# Patient Record
Sex: Female | Born: 1935
Health system: Southern US, Community
[De-identification: ages and names within clinical notes are randomized; demographics above are authoritative.]

## PROBLEM LIST (undated history)

## (undated) DIAGNOSIS — R011 Cardiac murmur, unspecified: Secondary | ICD-10-CM

## (undated) DIAGNOSIS — K219 Gastro-esophageal reflux disease without esophagitis: Secondary | ICD-10-CM

## (undated) DIAGNOSIS — R0789 Other chest pain: Secondary | ICD-10-CM

## (undated) DIAGNOSIS — R0609 Other forms of dyspnea: Secondary | ICD-10-CM

## (undated) DIAGNOSIS — R0600 Dyspnea, unspecified: Secondary | ICD-10-CM

## (undated) DIAGNOSIS — J90 Pleural effusion, not elsewhere classified: Secondary | ICD-10-CM

## (undated) DIAGNOSIS — J209 Acute bronchitis, unspecified: Secondary | ICD-10-CM

## (undated) HISTORY — PX: APPENDECTOMY: SHX54

## (undated) HISTORY — PX: OOPHORECTOMY: SHX86

## (undated) HISTORY — DX: Other forms of dyspnea: R06.09

## (undated) HISTORY — DX: Pleural effusion, not elsewhere classified: J90

## (undated) HISTORY — DX: Cardiac murmur, unspecified: R01.1

## (undated) HISTORY — DX: Acute bronchitis, unspecified: J20.9

## (undated) HISTORY — DX: Other chest pain: R07.89

## (undated) HISTORY — DX: Dyspnea, unspecified: R06.00

## (undated) HISTORY — DX: Gastro-esophageal reflux disease without esophagitis: K21.9

---

## 1990-01-06 ENCOUNTER — Encounter: Payer: Self-pay | Admitting: Internal Medicine

## 1997-10-29 ENCOUNTER — Other Ambulatory Visit: Admission: RE | Admit: 1997-10-29 | Discharge: 1997-10-29 | Payer: Self-pay | Admitting: Gynecology

## 1998-01-10 ENCOUNTER — Ambulatory Visit (HOSPITAL_COMMUNITY): Admission: RE | Admit: 1998-01-10 | Discharge: 1998-01-10 | Payer: Self-pay | Admitting: Gastroenterology

## 1998-10-31 ENCOUNTER — Other Ambulatory Visit: Admission: RE | Admit: 1998-10-31 | Discharge: 1998-10-31 | Payer: Self-pay | Admitting: Gynecology

## 1999-09-23 ENCOUNTER — Encounter: Payer: Self-pay | Admitting: Gastroenterology

## 1999-09-23 ENCOUNTER — Ambulatory Visit (HOSPITAL_COMMUNITY): Admission: RE | Admit: 1999-09-23 | Discharge: 1999-09-23 | Payer: Self-pay | Admitting: Gastroenterology

## 1999-10-10 ENCOUNTER — Encounter: Payer: Self-pay | Admitting: Gynecology

## 1999-10-10 ENCOUNTER — Encounter: Admission: RE | Admit: 1999-10-10 | Discharge: 1999-10-10 | Payer: Self-pay | Admitting: Gynecology

## 1999-10-31 ENCOUNTER — Other Ambulatory Visit: Admission: RE | Admit: 1999-10-31 | Discharge: 1999-10-31 | Payer: Self-pay | Admitting: Gynecology

## 2000-10-11 ENCOUNTER — Encounter: Admission: RE | Admit: 2000-10-11 | Discharge: 2000-10-11 | Payer: Self-pay | Admitting: Gynecology

## 2000-10-11 ENCOUNTER — Encounter: Payer: Self-pay | Admitting: Gynecology

## 2001-03-28 ENCOUNTER — Other Ambulatory Visit: Admission: RE | Admit: 2001-03-28 | Discharge: 2001-03-28 | Payer: Self-pay | Admitting: Gynecology

## 2001-10-19 ENCOUNTER — Encounter: Admission: RE | Admit: 2001-10-19 | Discharge: 2001-10-19 | Payer: Self-pay | Admitting: Gynecology

## 2002-04-24 ENCOUNTER — Other Ambulatory Visit: Admission: RE | Admit: 2002-04-24 | Discharge: 2002-04-24 | Payer: Self-pay | Admitting: Gynecology

## 2002-10-31 ENCOUNTER — Encounter: Admission: RE | Admit: 2002-10-31 | Discharge: 2002-10-31 | Payer: Self-pay | Admitting: Gynecology

## 2002-10-31 ENCOUNTER — Encounter: Payer: Self-pay | Admitting: Gynecology

## 2003-04-05 ENCOUNTER — Ambulatory Visit (HOSPITAL_COMMUNITY): Admission: RE | Admit: 2003-04-05 | Discharge: 2003-04-05 | Payer: Self-pay | Admitting: Gastroenterology

## 2003-04-12 ENCOUNTER — Ambulatory Visit (HOSPITAL_COMMUNITY): Admission: RE | Admit: 2003-04-12 | Discharge: 2003-04-12 | Payer: Self-pay | Admitting: Gastroenterology

## 2003-10-25 ENCOUNTER — Encounter: Admission: RE | Admit: 2003-10-25 | Discharge: 2003-10-25 | Payer: Self-pay | Admitting: Endocrinology

## 2004-04-22 ENCOUNTER — Other Ambulatory Visit: Admission: RE | Admit: 2004-04-22 | Discharge: 2004-04-22 | Payer: Self-pay | Admitting: Gynecology

## 2004-09-01 ENCOUNTER — Ambulatory Visit: Payer: Self-pay | Admitting: Internal Medicine

## 2004-10-28 ENCOUNTER — Ambulatory Visit (HOSPITAL_COMMUNITY): Admission: RE | Admit: 2004-10-28 | Discharge: 2004-10-28 | Payer: Self-pay | Admitting: Endocrinology

## 2005-04-06 ENCOUNTER — Encounter: Admission: RE | Admit: 2005-04-06 | Discharge: 2005-04-06 | Payer: Self-pay | Admitting: Endocrinology

## 2005-04-20 ENCOUNTER — Ambulatory Visit: Payer: Self-pay | Admitting: Internal Medicine

## 2005-06-29 ENCOUNTER — Ambulatory Visit: Payer: Self-pay | Admitting: Internal Medicine

## 2005-07-29 ENCOUNTER — Ambulatory Visit: Payer: Self-pay | Admitting: Gastroenterology

## 2005-12-22 ENCOUNTER — Ambulatory Visit (HOSPITAL_COMMUNITY): Admission: RE | Admit: 2005-12-22 | Discharge: 2005-12-22 | Payer: Self-pay | Admitting: Endocrinology

## 2006-01-14 ENCOUNTER — Ambulatory Visit: Payer: Self-pay | Admitting: Internal Medicine

## 2006-04-14 ENCOUNTER — Ambulatory Visit: Payer: Self-pay | Admitting: Gastroenterology

## 2006-04-22 ENCOUNTER — Other Ambulatory Visit: Admission: RE | Admit: 2006-04-22 | Discharge: 2006-04-22 | Payer: Self-pay | Admitting: Gynecology

## 2006-07-07 ENCOUNTER — Ambulatory Visit: Payer: Self-pay | Admitting: Gastroenterology

## 2006-07-07 LAB — CONVERTED CEMR LAB: Fecal Occult Blood: NEGATIVE

## 2006-10-04 ENCOUNTER — Encounter: Admission: RE | Admit: 2006-10-04 | Discharge: 2006-10-04 | Payer: Self-pay | Admitting: Endocrinology

## 2006-11-16 ENCOUNTER — Ambulatory Visit: Payer: Self-pay | Admitting: Cardiology

## 2006-12-30 ENCOUNTER — Ambulatory Visit: Payer: Self-pay

## 2006-12-30 ENCOUNTER — Encounter: Payer: Self-pay | Admitting: Cardiology

## 2007-01-27 ENCOUNTER — Encounter: Admission: RE | Admit: 2007-01-27 | Discharge: 2007-01-27 | Payer: Self-pay | Admitting: Gynecology

## 2007-01-28 ENCOUNTER — Ambulatory Visit (HOSPITAL_BASED_OUTPATIENT_CLINIC_OR_DEPARTMENT_OTHER): Admission: RE | Admit: 2007-01-28 | Discharge: 2007-01-28 | Payer: Self-pay | Admitting: Gynecology

## 2007-01-28 ENCOUNTER — Encounter (INDEPENDENT_AMBULATORY_CARE_PROVIDER_SITE_OTHER): Payer: Self-pay | Admitting: Gynecology

## 2007-03-25 ENCOUNTER — Ambulatory Visit: Payer: Self-pay | Admitting: Internal Medicine

## 2007-03-28 ENCOUNTER — Ambulatory Visit (HOSPITAL_COMMUNITY): Admission: RE | Admit: 2007-03-28 | Discharge: 2007-03-28 | Payer: Self-pay | Admitting: Endocrinology

## 2007-03-29 ENCOUNTER — Ambulatory Visit: Payer: Self-pay | Admitting: Internal Medicine

## 2007-04-20 ENCOUNTER — Ambulatory Visit: Payer: Self-pay | Admitting: Internal Medicine

## 2007-04-20 DIAGNOSIS — J3089 Other allergic rhinitis: Secondary | ICD-10-CM

## 2007-04-20 DIAGNOSIS — J302 Other seasonal allergic rhinitis: Secondary | ICD-10-CM

## 2007-04-20 DIAGNOSIS — R0609 Other forms of dyspnea: Secondary | ICD-10-CM

## 2007-04-20 DIAGNOSIS — R0989 Other specified symptoms and signs involving the circulatory and respiratory systems: Secondary | ICD-10-CM

## 2007-04-22 ENCOUNTER — Ambulatory Visit (HOSPITAL_COMMUNITY): Admission: RE | Admit: 2007-04-22 | Discharge: 2007-04-22 | Payer: Self-pay | Admitting: Endocrinology

## 2007-05-17 ENCOUNTER — Ambulatory Visit: Payer: Self-pay | Admitting: Internal Medicine

## 2007-09-02 DIAGNOSIS — J90 Pleural effusion, not elsewhere classified: Secondary | ICD-10-CM

## 2007-09-02 HISTORY — PX: OTHER SURGICAL HISTORY: SHX169

## 2007-09-02 HISTORY — DX: Pleural effusion, not elsewhere classified: J90

## 2007-09-12 ENCOUNTER — Ambulatory Visit: Payer: Self-pay | Admitting: Internal Medicine

## 2007-09-12 DIAGNOSIS — J209 Acute bronchitis, unspecified: Secondary | ICD-10-CM

## 2007-09-13 ENCOUNTER — Ambulatory Visit: Payer: Self-pay | Admitting: Pulmonary Disease

## 2007-09-13 DIAGNOSIS — J90 Pleural effusion, not elsewhere classified: Secondary | ICD-10-CM | POA: Insufficient documentation

## 2007-09-13 DIAGNOSIS — J189 Pneumonia, unspecified organism: Secondary | ICD-10-CM | POA: Insufficient documentation

## 2007-09-14 LAB — CONVERTED CEMR LAB
INR: 1.2 — ABNORMAL HIGH (ref 0.8–1.0)
Prothrombin Time: 13.8 s — ABNORMAL HIGH (ref 10.9–13.3)
aPTT: 31.1 s — ABNORMAL HIGH (ref 21.7–29.8)

## 2007-09-16 ENCOUNTER — Encounter (INDEPENDENT_AMBULATORY_CARE_PROVIDER_SITE_OTHER): Payer: Self-pay | Admitting: Interventional Radiology

## 2007-09-16 ENCOUNTER — Ambulatory Visit (HOSPITAL_COMMUNITY): Admission: RE | Admit: 2007-09-16 | Discharge: 2007-09-16 | Payer: Self-pay | Admitting: Internal Medicine

## 2007-09-16 IMAGING — US US PARACENTESIS
1 series · 9 of 9 positions shown · non-contrast
Comparison: none

CLINICAL DATA: Pneumonia.  Bilateral pleural effusions - left
greater than right.  Request to perform diagnostic and therapeutic
left thoracentesis.

LEFT ULTRASOUND-GUIDED THORACENTESIS
Physician Assistant: SUYAPA
TECHNIQUE: An ultrasound-guided thoracentesis was thoroughly
discussed with the patient and questions answered.  The benefits,
risks, alternatives and complications were also discussed.  The
patient understands and wishes to proceed with the procedure.  A
verbal as well as written consent was obtained. Ultrasound was
performed to localize and mark an adequate pocket of fluid for
thoracentesis.  The left posterior chest wall was prepped and
draped in the normal sterile fashion.  1% Lidocaine was used for
local anesthesia.  Under ultrasound guidance a 19-gauge Yueh
catheter was introduced yielding approximately 580 cc's of pleural
fluid.  The patient tolerated the procedure well and there were no
immediate complications. Post procedure chest x-ray is pending.

[Series 1: unknown · 0.33mm/px · 9 of 9 slices shown]
[im 1/9]
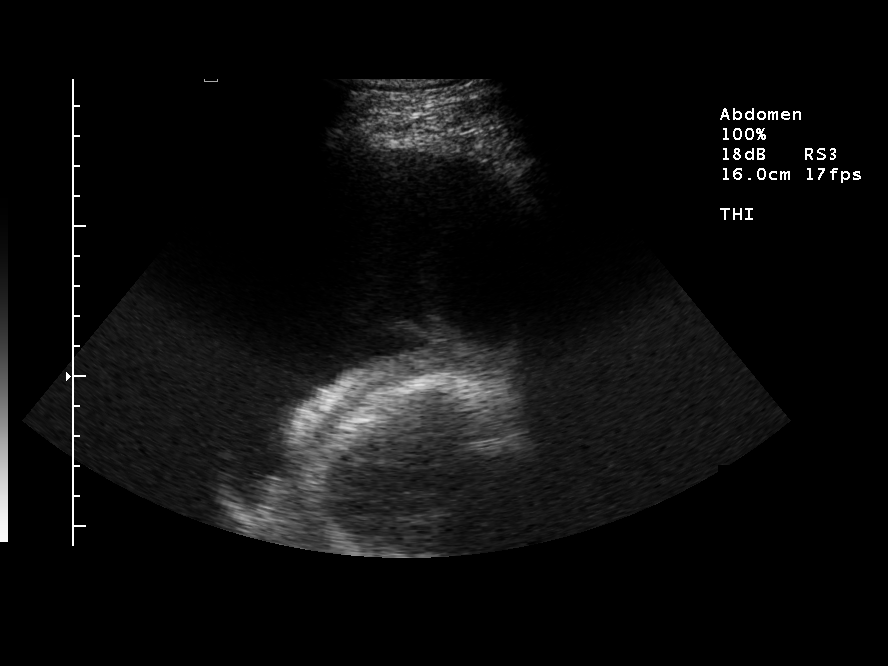
[im 2/9]
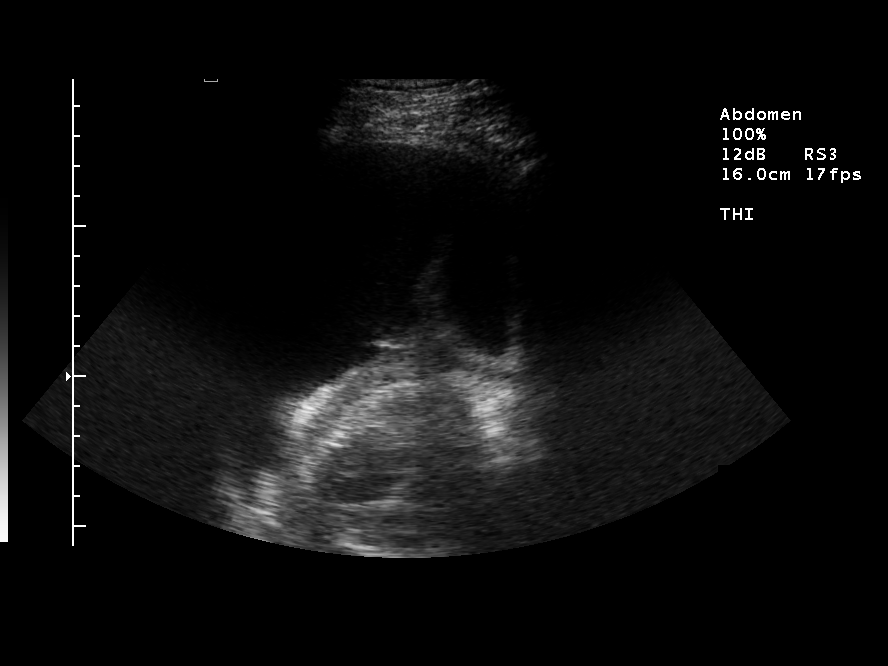
[im 3/9]
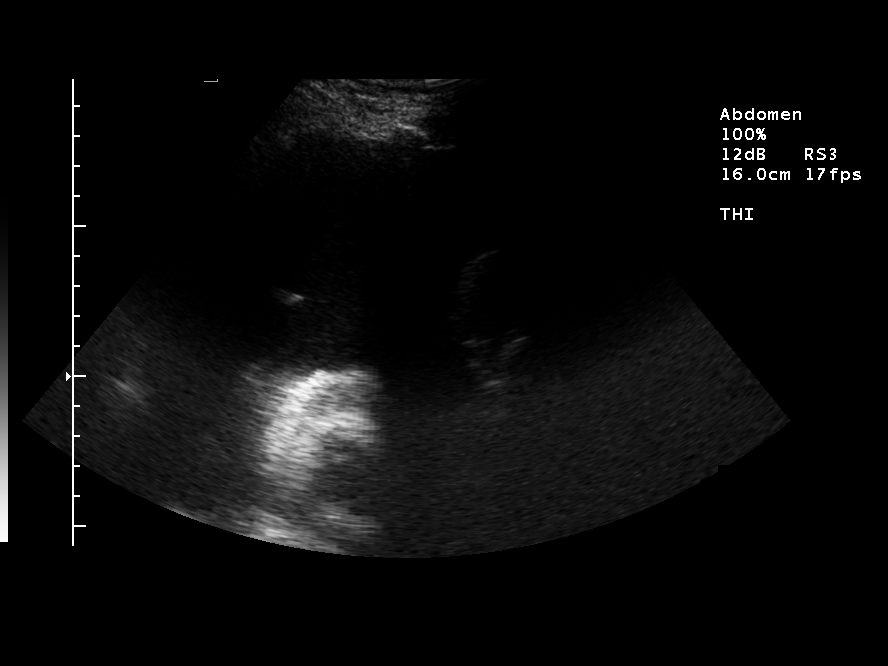
[im 4/9]
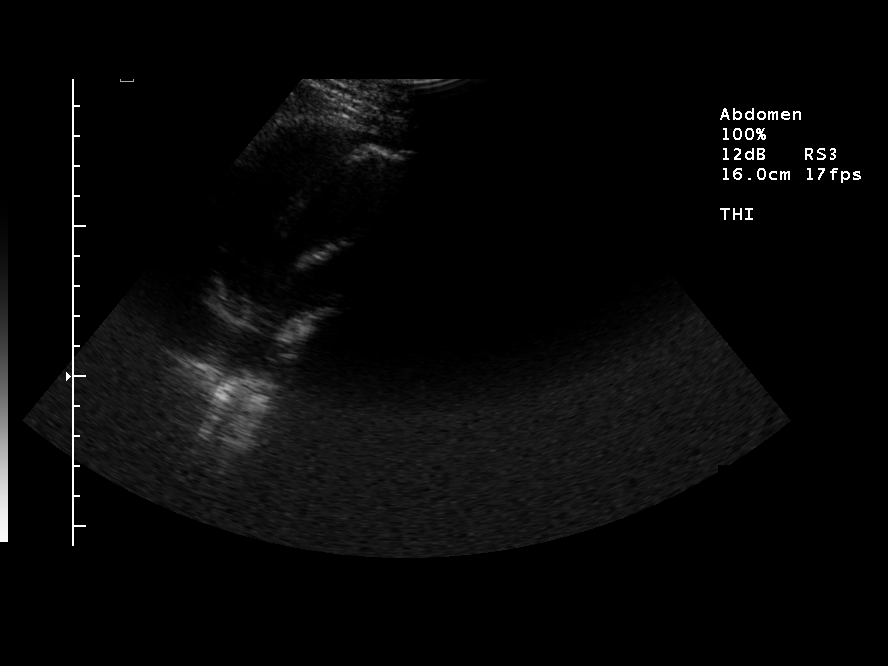
[im 5/9]
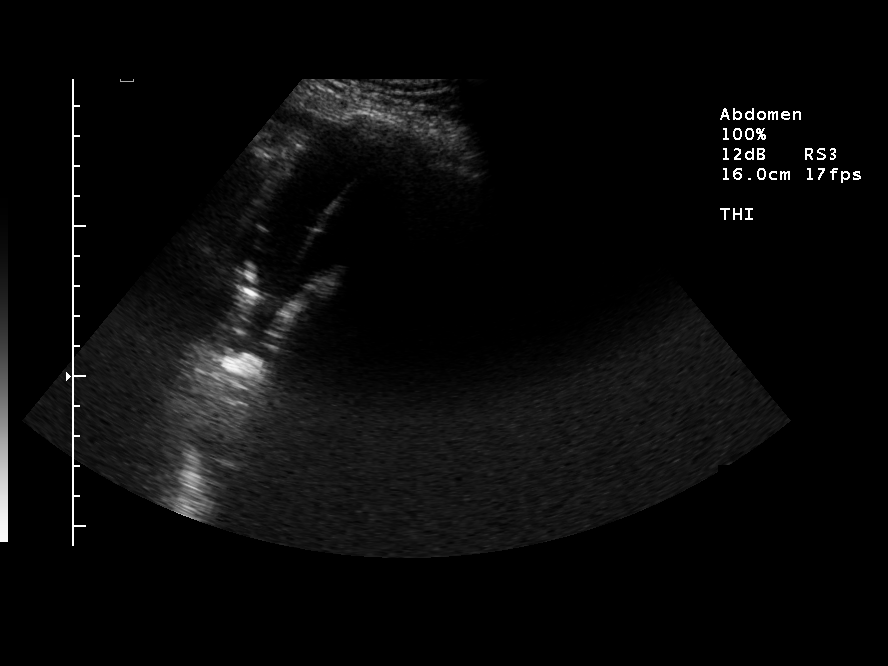
[im 6/9]
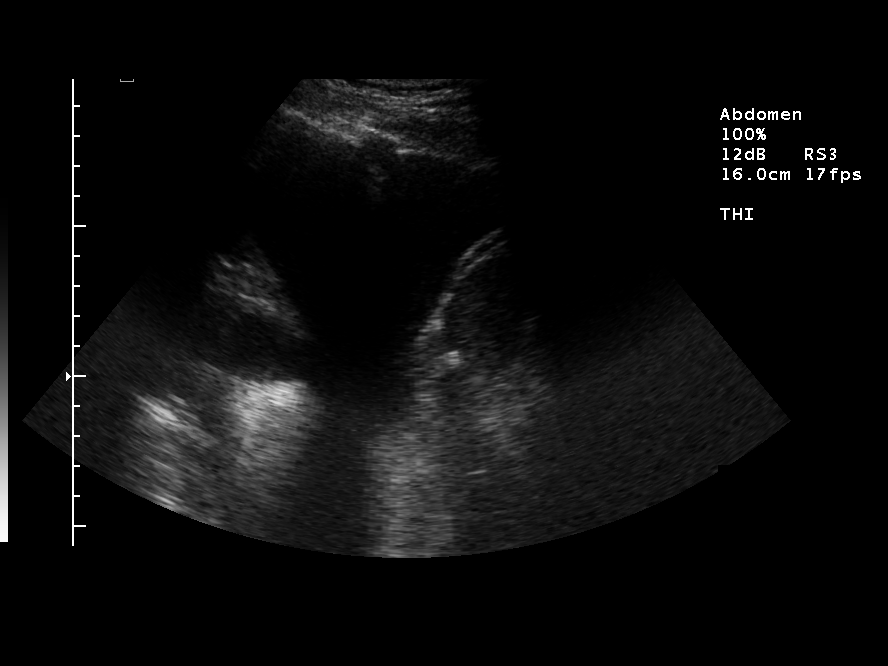
[im 7/9]
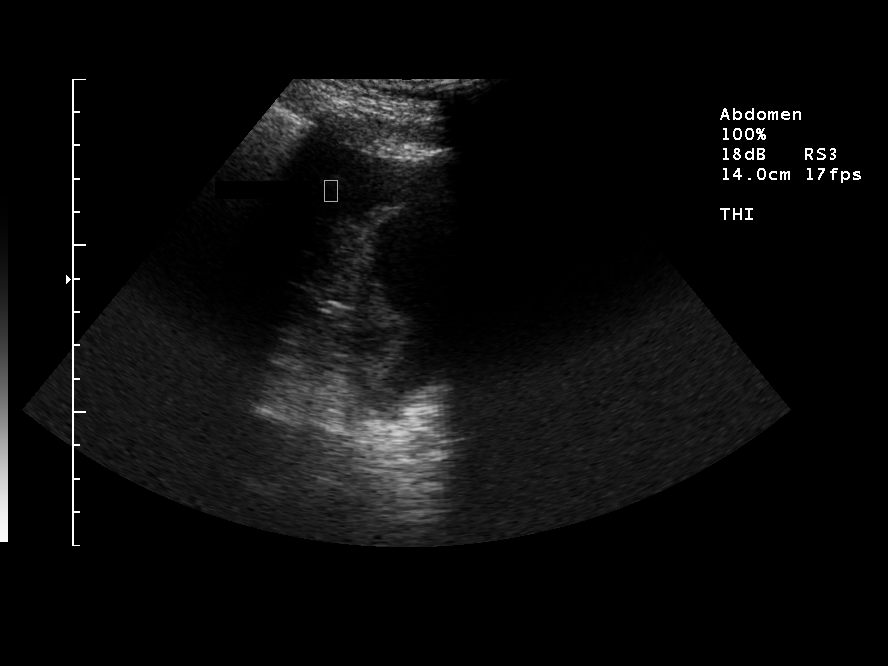
[im 8/9]
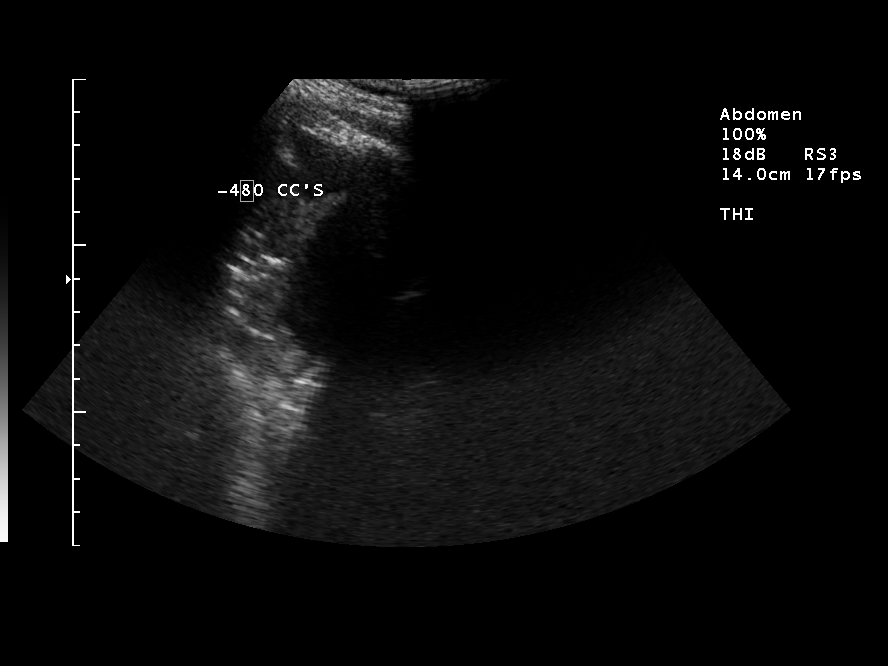
[im 9/9]
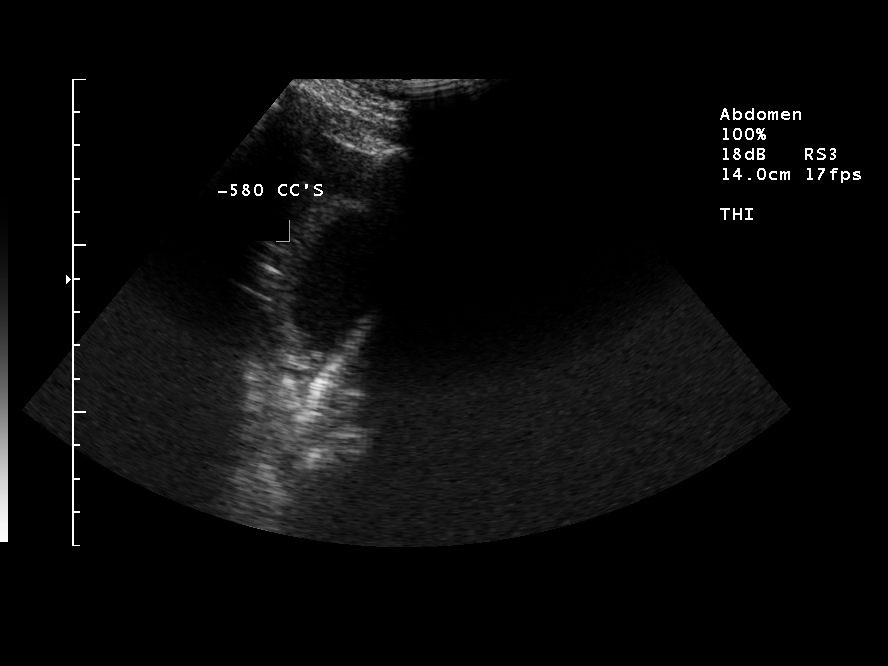

[9 of 9 positions shown; findings below may reference images not displayed]

IMPRESSION: 1. ) Successful ultrasound-guidedleft thoracentesis yielding 580 ml
of fluid. An appropriate of fluid was sent to the laboratory for
further analysis.

2.) Small right pleural effusion noted.

Read by: SUYAPA.-SUYAPA

## 2007-09-20 ENCOUNTER — Ambulatory Visit: Payer: Self-pay | Admitting: Internal Medicine

## 2007-10-25 ENCOUNTER — Ambulatory Visit: Payer: Self-pay | Admitting: Internal Medicine

## 2007-11-24 ENCOUNTER — Encounter: Payer: Self-pay | Admitting: Internal Medicine

## 2008-04-02 ENCOUNTER — Ambulatory Visit: Payer: Self-pay | Admitting: Internal Medicine

## 2008-04-06 ENCOUNTER — Encounter: Admission: RE | Admit: 2008-04-06 | Discharge: 2008-04-06 | Payer: Self-pay | Admitting: Gynecology

## 2008-04-19 ENCOUNTER — Encounter: Payer: Self-pay | Admitting: Internal Medicine

## 2008-04-23 ENCOUNTER — Ambulatory Visit: Payer: Self-pay | Admitting: Internal Medicine

## 2008-05-25 ENCOUNTER — Ambulatory Visit: Payer: Self-pay | Admitting: Cardiology

## 2008-06-07 ENCOUNTER — Ambulatory Visit: Payer: Self-pay

## 2008-06-20 ENCOUNTER — Ambulatory Visit: Payer: Self-pay | Admitting: Internal Medicine

## 2009-01-14 ENCOUNTER — Telehealth (INDEPENDENT_AMBULATORY_CARE_PROVIDER_SITE_OTHER): Payer: Self-pay | Admitting: *Deleted

## 2009-04-08 ENCOUNTER — Ambulatory Visit: Payer: Self-pay | Admitting: Internal Medicine

## 2009-04-21 ENCOUNTER — Telehealth: Payer: Self-pay | Admitting: Internal Medicine

## 2009-05-01 ENCOUNTER — Ambulatory Visit (HOSPITAL_COMMUNITY): Admission: RE | Admit: 2009-05-01 | Discharge: 2009-05-01 | Payer: Self-pay | Admitting: Gynecology

## 2009-08-27 ENCOUNTER — Ambulatory Visit: Payer: Self-pay | Admitting: Internal Medicine

## 2009-11-29 ENCOUNTER — Telehealth: Payer: Self-pay | Admitting: Cardiology

## 2010-03-20 ENCOUNTER — Ambulatory Visit: Payer: Self-pay | Admitting: Internal Medicine

## 2010-03-23 ENCOUNTER — Encounter: Payer: Self-pay | Admitting: Internal Medicine

## 2010-05-02 ENCOUNTER — Telehealth: Payer: Self-pay | Admitting: Internal Medicine

## 2010-05-08 ENCOUNTER — Ambulatory Visit: Payer: Self-pay | Admitting: Internal Medicine

## 2010-05-25 ENCOUNTER — Encounter: Payer: Self-pay | Admitting: Endocrinology

## 2010-05-25 ENCOUNTER — Encounter: Payer: Self-pay | Admitting: Gynecology

## 2010-06-03 NOTE — Miscellaneous (Signed)
Summary: Injection Orders / [facility]    Injection Orders / [facility]    Imported By: Lennie Odor 10/01/2009 17:19:55  _____________________________________________________________________  External Attachment:    Type:   Image     Comment:   External Document

## 2010-06-03 NOTE — Miscellaneous (Signed)
Summary: Field seismologist   Imported By: Sherian Rein 03/26/2010 08:22:40  _____________________________________________________________________  External Attachment:    Type:   Image     Comment:   External Document

## 2010-06-03 NOTE — Assessment & Plan Note (Signed)
Summary: 12 months/apc   Primary Provider/Referring Provider:  Juleen China  CC:  Yearly follow up visit-allergies..  History of Present Illness: 04/02/08- Allergic rhinitis, bronchitis, hx pleural effusion She dropped off allergy vaccine in early summer and now thinks she should be back on it. Felt better while on allergy vaccine, without adverse effects. C/O sinuses ache but denies purulent or blody discharge, headache or fever, wheeze or significant cough.   April 08, 2009- Allergic rhinitis, bronchitis, hx pleural effusion Building own allergy vaccine has gone well with no concerns. She is at 1:50 with no reactions, . We discussed advancing to 1:10. Noted she didn't have trouble this year with Fall leaves. She is concerned about a brown, slightly pruritic spot on back she says Dermatologist has already looked at and given cream for.    March 20, 2010-Allergic rhinitis, bronchitis, hx L pleural effusion- benign Nurse-CC: Yearly follow up visit-allergies. Allergy vaccine has done well with no adverse reactions, giving own at 1:50.  Now nose itches. Breathing seems labored but no wheeze, cough or pain. Did have some discomfort left post ribcage and could feel heartbeat there 2-3 months ago, when lying on that side. Had benign left effusion in 2009, tapped off.  had flu vax. Gets stuffy - discussed decongestants.     Preventive Screening-Counseling & Management  Alcohol-Tobacco     Smoking Status: never  Current Medications (verified): 1)  Multivitamins   Tabs (Multiple Vitamin) .... Take 1 Tablet By Mouth Once A Day 2)  Citracal Plus   Tabs (Multiple Minerals-Vitamins) .... Take 2 Tablet By Mouth Once A Day 3)  Prilosec Otc 20 Mg  Tbec (Omeprazole Magnesium) .... Take 1 By Mouth Once Daily 4)  Zocor 40 Mg  Tabs (Simvastatin) .... Take 1 Tablet By Mouth Once A Day 5)  Adult Aspirin Ec Low Strength 81 Mg  Tbec (Aspirin) .... Take 1 Tablet By Mouth Once A Day 6)  Q-10 Co-Enzyme 30 Mg   Caps (Coenzyme Q10) .... Take 1 Tablet By Mouth Once A Day 7)  Glucosamine 1500 Complex   Caps (Glucosamine-Chondroit-Vit C-Mn) .... Take 1 Tablet By Mouth Once A Day 8)  Allergy Vaccine New Start .Marland Kitchen.. 1:50,000 Teach  (W-E) 9)  Epipen 0.3 Mg/0.84ml (1:1000) Devi (Epinephrine Hcl (Anaphylaxis)) .... For Severe Allergic Reaction 10)  Vitamin B-12 1000 Mcg Tabs (Cyanocobalamin) .... Take 1 By Mouth Once Daily 11)  Fish Oil 1000 Mg Caps (Omega-3 Fatty Acids) .... Take 1 By Mouth Once Daily  Allergies (verified): 1)  ! 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  The patient denies shortness of breath at rest, productive cough, non-productive cough, coughing up blood, chest pain, irregular heartbeats, acid heartburn, indigestion,  loss of appetite, weight change, abdominal pain, difficulty swallowing, sore throat, tooth/dental problems, headaches, nasal congestion/difficulty breathing through nose, and sneezing.    Vital Signs:  Patient profile:   77 year old female Height:      62.5 inches Weight:      152.13  pounds BMI:     27.48 O2 Sat:      95 % on Room air Pulse rate:   67 / minute BP sitting:   110 / 62  (left arm) Cuff size:   regular  Vitals Entered By: Reynaldo Minium CMA (March 20, 2010 4:28 PM)  O2 Flow:  Room air CC: Yearly follow up visit-allergies.   Physical Exam  Additional Exam:  General: A/Ox3; pleasant and cooperative, NAD, SKIN: clear, no rash NODES: no lymphadenopathy HEENT: [place]/AT, EOM- WNL, Conjuctivae- clear, PERRLA, TM-WNL, Nose- clear, Throat- clear and wnl NECK: Supple w/ fair ROM, JVD- none, normal carotid impulses w/o bruits Thyroid-  CHEST: Clear to P&A. I can't appreciate dullness or rub at left posterior ribcage where she indicates. HEART: RRR, no m/g/r heard ABDOMEN: Soft and nl;  ZOX:WRUE, nl pulses, no edema  NEURO: Grossly intact to observation      Impression & Recommendations:  Problem # 1:  ALLERGIC RHINITIS (ICD-477.9)  We will have her allergy vaccine advanced to 1:10 at next order. discussed available decongestants.  Try sample/ script nasonex.   Her updated medication list for this problem includes:    Nasonex 50 Mcg/act Susp (Mometasone furoate) .Marland Kitchen... 1-2 sprays each nostril once daily at bedtime  Problem # 2:  EFFUSION, PLEURAL (ICD-511.9)  Hx pleural effusion. She has a little anxiety about the way her chest feels at times that might possibly reflect old pleural thickening, but she doesn't want a CXR and exam is benign. She can bring this up  again as needed .   Medications Added to Medication List This Visit: 1)  Citracal Plus Tabs (Multiple minerals-vitamins) .... Take 2 tablet by mouth once a day 2)  Vitamin B-12 1000 Mcg Tabs (Cyanocobalamin) .... Take 1 by mouth once daily 3)  Fish Oil 1000 Mg Caps (Omega-3 fatty acids) .... Take 1 by mouth once daily 4)  Nasonex 50 Mcg/act Susp (Mometasone furoate) .Marland Kitchen.. 1-2 sprays each nostril once daily at bedtime  Other Orders: Est. Patient Level III (45409)  Patient  Instructions: 1)  Please schedule a follow-up appointment in 1 year. 2)  sample/ script Nasonex nasal spray for allergies 3)    1-2 sprays each nostril once daily at bedtime 4)  Consider an otc decongestant for stuffy nose: 5)     Sudafed-  sign for it at pharmacy counter. 6)     Phenylephrine (Sudafed-PE and others) find on shelf 7)  Continue allergy vaccine- we will increase the strength next time you order. Prescriptions: EPIPEN 0.3 MG/0.3ML (1:1000) DEVI (EPINEPHRINE HCL (ANAPHYLAXIS)) For severe allergic reaction  #1 x prn   Entered and Authorized by:   Waymon Budge MD   Signed by:   Waymon Budge MD on 03/20/2010   Method used:   Print then Give to Patient   RxID:   [id number] NASONEX 50 MCG/ACT SUSP (MOMETASONE FUROATE) 1-2 sprays each nostril once daily at bedtime  #1 x prn   Entered and Authorized by:   Waymon Budge MD   Signed by:   Waymon Budge MD on 03/20/2010   Method used:   Print then Give to Patient   RxID:  [id number] EPIPEN 0.3 MG/0.3ML (1:1000) DEVI (EPINEPHRINE HCL (ANAPHYLAXIS)) For severe allergic reaction  #1 x prn   Entered and Authorized by:   Waymon Budge MD   Signed by:   Waymon Budge MD on 03/20/2010   Method used:   Historical   RxID:   [id number]

## 2010-06-03 NOTE — Progress Notes (Signed)
Summary: has bulge near her heart-trouble sleeping  Phone Note Call from Patient   Caller: Patient [phone] or [phone] Reason for Call: Talk to Nurse Summary of Call: pt can't sleep at night due to a bulge near her heart, some discomfort, mainly due to the position she has to lay in, some dizziness-pls advise [phone] in [phone] in the pm Initial call taken by: Glynda Jaeger,  November 29, 2009 11:38 AM  Follow-up for Phone Call        spoke with pt who is concerned that she has a hernia and feels like it is "giving her a fit"  Instructed pt there is no mention of a hernia at her last office visit with Dr Daleen Squibb from 05/2008 and that she will need to contact her primary care MD for any type of treatment or referral for a hernia.  Pt states understanding Follow-up by: Charolotte Capuchin, RN,  November 29, 2009 12:42 PM     Appended Document: has bulge near her heart-trouble sleeping  Reviewed Juanito Doom, MD

## 2010-06-05 NOTE — Progress Notes (Signed)
Summary: rx- needs today  Phone Note Call from Patient Call back at Home Phone [phone]   Caller: Patient Call For: young Summary of Call: pt [facility]pi pen [facility] [address] [facility] pt ph# [phone] or [phone] (needs today for ins coverage) Initial call taken by: Tivis Ringer, CNA,  May 02, 2010 3:04 PM  Follow-up for Phone Call        rx has been sent to the pharmacy for the epi pen with refills.  lmom for pt to make her aware of rx has been sent to the pharamcy Randell Loop CMA  May 02, 2010 3:17 PM     Prescriptions: EPIPEN 0.3 MG/0.3ML (1:1000) DEVI (EPINEPHRINE HCL (ANAPHYLAXIS)) For severe allergic reaction  #1 x 11   Entered by:   Randell Loop CMA   Authorized by:   Waymon Budge MD   Signed by:   Randell Loop CMA on 05/02/2010   Method used:   [facility]ron[facility] to        [facility]  [phone]* (retail)       [address] [place], [place]  [address]       Ph: [phone] or [phone]       Fax: [phone]   RxID:   [id number]

## 2010-09-16 NOTE — Assessment & Plan Note (Signed)
[facility]                            CARDIOLOGY OFFICE NOTE   NAME:Bargar, JAPLEEN Charles                      MRN:          [id number]  DATE:05/25/2008                            DOB:          11-14-1933    Sara Charles comes in today to see me because of difficulty breathing  particularly when she climbs steps.   I saw her as the patient in July 2008.  At that time there was a  question of her having a mildly dilated abdominal aorta, as well as she  was having dyspnea on exertion, some atypical chest pain.   A 2-D echocardiogram was essentially normal.  She subsequently had an  abdominal ultrasound ordered by me that showed no evidence of abdominal  aortic aneurysm.  She had minimal atherosclerosis.   She is now 77 years of age, though she looks much younger.  She is  followed by [facility] for allergies.  She had pneumonia  recently.  She has also had a history of some subpleural effusions that  looked better on her chest x-ray done in May 2009.   She is currently on Zocor 40 mg a day, aspirin 81 mg a day, some  multivitamins, and allergy vaccines.  She takes Aciphex on occasion as  well.   She denies orthopnea, PND, or peripheral edema.  She has had no true  angina.   PHYSICAL EXAMINATION:  VITAL SIGNS:  Her blood pressure today is 121/78,  her pulse is 82 and regular, weight is 138 which is down 7, looks much  younger than stated age.  Skin is slightly pale.  HEENT:  Otherwise is normal.  Carotid upstrokes were equal bilaterally  without bruits.  No JVD.  Thyroid is not enlarged.  Trachea is midline.  There is no lymphadenopathy.  LUNGS:  Clear to auscultation and percussion with no rub or decreased  breath sounds in the bases.  There was definitely no crackles.  HEART:  A nondisplaced PMI.  Normal S1 and S2.  No murmur, rub, or  gallop.  ABDOMEN:  Soft, good bowel sounds.  No midline bruit.  No hepatomegaly.  EXTREMITIES:  No  cyanosis, clubbing, or edema.  Pulses are intact.  There is no sign of DVT.  NEUROLOGIC:  Intact.  MUSCULOSKELETAL:  Some chronic arthritic changes.   Her electrocardiogram demonstrates sinus rhythm with low voltage.  She  has got poor progression across the anterior precordium which is stable.  Again, her echocardiogram showed normal left ventricular function wall  motion.   ASSESSMENT:  Dyspnea on exertion which is increased significantly.  She  does have multiple cardiac risk factors and I feel she wants a  functional study.   PLAN:  Exercise rest stress Myoview.  Hopefully, can reproduce her  symptoms and evaluate her coronaries at the same time.   Assuming this is negative, I will see her back p.r.n.  If she does have  any significant ischemia, she will need cardiac catheterization.  All  questions were addressed and answered.     Sara C. Daleen Squibb, MD, [facility]  Electronically  Signed    TCW/MedQ  DD: 05/25/2008  DT: 05/26/2008  Job #: [id number]   cc:   [facility], M.D.

## 2010-09-16 NOTE — Op Note (Signed)
Sara Charles, Sara [facility]               ACCOUNT NO.:  [id number]   MEDICAL RECORD NO.:  [id number]          PATIENT TYPE:  AMB   LOCATION:  [facility]                         FACILITY:  [facility]   PHYSICIAN:  Gretta Cool, M.D. DATE OF BIRTH:  1934-04-11   DATE OF PROCEDURE:  01/28/2007  DATE OF DISCHARGE:                               OPERATIVE REPORT   PREOPERATIVE DIAGNOSES:  1. Right lower quadrant discomfort with complex right adnexal mass by      CT and by ultrasound. Normal CA125s.  2. Endometrial polyp.   PROCEDURES:  1. Diagnostic laparoscopy.  2. Right right salpingo-oophorectomy with Endo catch removal via the      laparoscope port.  3. Hysteroscopy resection of endometrial polyp.   SURGEON:  Beather Arbour, MD.   ANESTHESIA:  General orotracheal.   INDICATIONS FOR PROCEDURE:  The patient is a 77 year old with lower  abdominal, right lower quadrant pain and CT diagnosis of a complex  adnexal mass in the right lower quadrant. She has had ultrasound  followup of the complex structure with stability over time, but she  continues to have right lower quadrant discomfort. Her CA125s have been  normal. She also has an endometrial polyp by ultrasound. She is now  admitted for diagnostic laparoscopy, right salpingo-oophorectomy,  possible bilateral salpingo-oophorectomy, hysteroscopy and resection of  the endometrial polyp. She understands the risks/benefit ratio, the  options and alternatives all. She also understands the very small  possibility that this is a malignancy and might require further  evaluation or treatment.   DESCRIPTION OF PROCEDURE:  Under excellent general anesthesia, the  patient was prepped and draped in Allen stirrups. A vertical skin  incision was made at the umbilicus. The Veress cannula was then  introduced and approximately 2.5 liters of carbon dioxide introduced.  The laparoscope trocar port was then placed, 10-mm size. The peritoneal  cavity was then  examined. The uterus was noted to be retroverted. The  Hulka tenaculum applied to the cervix was noted to be bulging the cervix  and not in the fundus of the uterus.   The accessory ports were placed and the adnexal structures examined. The  left adnexal structure was a small atrophic ovary. On the right she had  an enlarged ovarian cyst and a parovarian versus ovarian cyst that  transilluminated easily with clear fluid inside. There were no  excrescences or suggestion of ovarian disease. At this point the  Seitzinger tripolar forceps was used to cauterize the infundibulopelvic  and then ovarian ligaments progressively so as to remove the entire  ovary. The Endo catch was then used with 5-mm port direction to recover  the specimen. The Endo catch was then delivered through the 10-mm port  site. The incision had to be enlarged to remove the ovary. Note the Endo  catch bag leaked upon removal through the port site, but all of the  tissue was easily removed. At this point the pelvic cavity was irrigated  with Lactated Ringers to remove debris. There was no bleeding from any  of the sites, no other evidence of intraperitoneal pathology.  At this point the Hulka tenaculum was removed and under direct vision  the cervix recannulated with a set of Pratt dilators. Under direct  vision the cavity was entered, the cavity was examined with a 5-mm scope  and then with the 7-mm resectoscope. The cavity was then systematically  examined. A polyp was noted on the anterior wall and was resected and  submitted for pathologic exam.   At the end of the procedure there was no significant bleeding. The  hysteroscopy portion was concluded. At this point the peritoneal cavity  was again examined and there is no evidence of any abnormality and port  sites were closed with deep suture of 0 Vicryl, UR6 needle and  subcuticular closure of 5-0 Vicryl. Skin closure with Steri-Strips. The  patient's  incisions  were infiltrated with Marcaine 0.5%. she was then  returned to the recovery room in excellent condition without  complication.           ______________________________  Gretta Cool, M.D.     CWL/MEDQ  D:  01/28/2007  T:  01/28/2007  Job:  [id number]   cc:   Brooke Bonito, M.D.  Fax: [phone]

## 2010-09-16 NOTE — Assessment & Plan Note (Signed)
[facility]                             PULMONARY OFFICE NOTE   NAME:[name], [name]                      MRN:          [id number]  DATE:03/25/2007                            DOB:          11-04-1933    PROBLEM:  Allergic rhinitis.   HISTORY:  She had gotten off of allergy vaccine during the summer and  has decided that was a mistake and she wants to restart. She is noticing  increased nasal congestion and some tight feeling through the chest  which she says is what happened the last time she tried to quit several  years ago. She has noticed wheezing or purulent discharge. There had  been no problems with her vaccine. She had a dyspnea evaluation in July  with Dr. Daleen Squibb, who attributed it primarily to deconditioning. Ejection  fraction on echocardiogram was 65%.   MEDICATIONS:  1. Allergy vaccine.  2. Calcium.  3. Aciphex 20 mg.  4. Zocor 40 mg.  5. Coenzyme Q10.  6. Glucosamine.  7. Magnesium.  8. Fish oil.  9. Probiotic.  10.Vitamins.  11.Pepto Bismol p.r.n.   Drug intolerant ERYTHROMYCIN.   OBJECTIVE:  Weight 145 pounds, blood pressure 112/64, pulse 83, room air  saturation 98% at rest. External auditory canals were narrow with  cerumen, non-occlusive, what I could see of the tympanic membranes was  clear. There was bilateral nasal turbinate edema with some mucus  bridging, no polyps. Pharynx was clear. Voice quality normal. No  strider. Heart sounds regular without murmur. Lung fields clear.   SKIN TESTING:  Positive histamine, negative diluent controls. Positive  reaction especially for grass, fall weed, tree pollens, and dust mite.  She reacts to feather. She is aware of that and has no feather products.   We reviewed allergy vaccine risk, goals, and realistic expectations, and  discussed anaphylaxis, policy concerning administration of allergy  vaccine outside of a medical office, and EpiPen use.  An EpiPen was prescribed. She  wishes to get back on allergy vaccine and  is going to restart with new mix building per protocol. Schedule return  in three months, earlier p.r.n.     Clinton D. Maple Hudson, MD, [facility], FACP  Electronically Signed    CDY/MedQ  DD: 03/26/2007  DT: 03/27/2007  Job #: [id number]   cc:   Brooke Bonito, M.D.

## 2010-09-16 NOTE — Assessment & Plan Note (Signed)
[facility]                            CARDIOLOGY OFFICE NOTE   NAME:Sara Charles, Sara Charles                      MRN:          [id number]  DATE:11/16/2006                            DOB:          Nov 16, 1933    REASON FOR CONSULTATION:  I was asked by Dr. Brooke Bonito to follow up  with Sara Charles for dyspnea on exertion and a mildly dilated  aortic root.   HISTORY OF PRESENT ILLNESS:  She was seen in the office by Dr. Madolyn Frieze.  Crenshaw  of our group in December 2004.  At that time she had atypical  chest pain and mild dyspnea on exertion.  A 2-D echocardiogram  demonstrated a mildly dilated abdominal aorta.  She had a negative  stress Myoview and a 2-D echocardiogram demonstrated normal left  ventricular function and trivial aortic insufficiency and mitral  regurgitation.   She has had significant dyspnea on exertion but is about the same as it  was over the past year.  She denies any angina per se.  She has had no  orthopnea, PND, abdominal pain or back pain.   She does have a history of reflux and occasionally has reflux symptoms.   PAST MEDICAL HISTORY:  Significant for intolerance to [facility].   CURRENT MEDICATIONS:  1. Allergy vaccine.  2. Multivitamin.  3. Calcium.  4. Citracal.  5. Aciphex 20 mg q.d.  6. Zocor 40 mg q.d.  7. Aspirin 81 mg q.d.  8. Coenzyme Q10 q.d.  9. Glucosamine.  10.Magnesium.  11.Fish oil.  12.Vitamin B.   CARDIAC RISK FACTORS:  Age and hyperlipidemia.  She said that her blood  sugar is borderline.   SOCIAL HISTORY:  She does not smoke and she rarely drinks any alcohol.   FAMILY HISTORY:  Her mother has coronary artery disease, as did her  father.  Her mother is a patient of mine.   REVIEW OF SYSTEMS:  Denied any headache, fever, chills.  No productive  cough or hemoptysis.  No dysphagia or odynophagia, melena or  hematochezia.  She does have occasional reflux symptoms.  She has had no  dysuria or  hematuria.  No rash.  No seizure activity.  There is no  history of claudication.  The remainder of her systems are negative.   PHYSICAL EXAMINATION:  GENERAL:  She is very pleasant.  VITAL SIGNS:  Her blood pressure is 116/72, pulse 78 and regular.  Her  weight is 145 pounds.  She is 5 feet 2 inches.  HEENT:  Normocephalic and atraumatic.  Pupils equal, round, reactive to  light and accommodation.  Extraocular movements intact.  Sclerae clear.  Facial symmetry is normal.  NECK:  Supple, carotid upstrokes equal bilaterally without bruits.  No  jugular venous distention.  Thyroid is not enlarged.  Trachea is  midline.  LUNGS:  Clear.  HEART:  Her PMI is non-displaced.  There is no aortic insufficiency  murmur.  There is a normal S1 and S2.  ABDOMEN:  Soft with good bowel sounds.  No tenderness.  No midline  bruit.  EXTREMITIES:  No clubbing, cyanosis or edema.  Pulses intact.  NEUROLOGIC:  Intact.  SKIN:  Pale, otherwise unremarkable.   Electrocardiogram shows a sinus rhythm, low voltage, unchanged.  Poor R-  wave progression across the anterior precordium.   IMPRESSION/PLAN:  Ms. Erlandson still has dyspnea on exertion but I do not  gather that it has changed that much.  Some of this is probably  deconditioning.  I do not think she has any significant coronary artery  disease by history.   She needs followup of her left ventricular function as well as her  abdominal aortic dilatation.  I have arranged for her to have a 2-D  echocardiogram, as well as an abdominal ultrasound.  Assuming that these  are stable and not significant, will see her back on a p.r.n. basis.     Thomas C. Daleen Squibb, MD, [facility]  Electronically Signed    TCW/MedQ  DD: 11/16/2006  DT: 11/17/2006  Job #: [id number]   cc:   Brooke Bonito, M.D.

## 2010-09-19 NOTE — Assessment & Plan Note (Signed)
[facility]                         GASTROENTEROLOGY OFFICE NOTE   NAME:Sara Charles, Sara Charles                      MRN:          [id number]  DATE:04/14/2006                            DOB:          1934/04/25    Dietrich comes in, said Dr. Juleen China did a stool test on her that was  positive, states she has not seen any blood herself, has not really been  anemic.  She says she is feeling all right.  Her last colonoscopic  examination was in 2000, but her colon was so elongated and redundant I  really could not get around like the same problem I had once before.  We  did a barium enema, which did not reveal anything significant except for  the very elongated and tortuous colon.  They had indicated that it was  very elongated and redundant, the cecum appeared to be mobile, lies in  the region of the right upper quadrant.  There was incomplete rotation  and more of it was located on the left side than the right, and the  small bowel was on the right, so no wonder it is so difficult to pass.  I talked to her about getting a virtual colonoscopy, told her to see if  maybe she could get her insurance company to do this.  I told her to try  some probiotics and acidophilus, she says this usually helps her with  her bowel activity.  I told her we would check her stools for occult  blood again.  If there was significant positivity we could always do the  colonoscopy or virtual as needed.   PHYSICAL EXAMINATION:  She weighed 151, blood pressure 112/60, pulse 70  and regular.  Neck arteries, extremities all unremarkable.   IMPRESSION:  Positive stool as per Dr. Juleen China in a position who has known  very redundant, tortuous colon, as I described from my experiences doing  colonoscopy on her.  My recommendation again was to do the virtual  colonoscopy some time when she needs a followup anyway, as I really  think that doing an endoscopic procedure is almost impossible.  She is  in agreement with this.  We have talked about this in the past.  Dr.  Juleen China had tried to get her to see Dr. Virginia Rochester, but with the fact she had  been a patient of mine for a number of years she wanted our input, and  in any case I am not sure that a followup colonoscopic examination would  be reasonable under the circumstances.     Ulyess Mort, MD  Electronically Signed    SML/MedQ  DD: 04/14/2006  DT: 04/15/2006  Job #: [id number]   cc:   Brooke Bonito, M.D.

## 2010-09-19 NOTE — Letter (Signed)
April 14, 2006    Brooke Bonito, M.D.  [address], [address]  [place], [place] [address]   RE:  NENE, ARANAS  MRN:  [id number]  /  DOB:  1933-05-22   Dear Maurine Minister:   I saw our mutual patient, Shanterica Harbach.  She indicated that you had  found some positive stool on her physical examination, and suggested she  have a colonoscopy.  You did set her up with Dr. Virginia Rochester, but I have done a  number of procedures on her in the past and seen her over the past  years, so she returned back here.  In reviewing her chart, we have never  really been able to satisfactorily get to her right colon, and several  barium enemas have been done which revealed incomplete rotation with the  cecum up in the right upper quadrant, most of her colon on her left  side, and I am sure it is quite mobile and this is what precludes [place]  from being able to get to the cecum.  This being the case, I really  think doing a virtual colonoscopy would be in order in a patient with  this condition.  I have asked her to do Coloscreens, and if they  continue to be positive I think that we should pursue this.  If not,  then I think that this can be done some time in the future.  I still  think that it would be advisable to pursue her with a virtual  colonoscopy instead of going through the endoscopic procedure, unless a  lesion was seen that just had to be removed endoscopically.    Sincerely,      Ulyess Mort, MD  Electronically Signed    SML/MedQ  DD: 04/14/2006  DT: 04/15/2006  Job #: [id number]

## 2010-11-10 ENCOUNTER — Encounter: Payer: Self-pay | Admitting: Cardiology

## 2010-11-10 ENCOUNTER — Telehealth: Payer: Self-pay | Admitting: Internal Medicine

## 2010-11-10 DIAGNOSIS — R5383 Other fatigue: Secondary | ICD-10-CM

## 2010-11-10 NOTE — Telephone Encounter (Signed)
Spoke with the pt and she c/o having body aches especially on the left side of her chest and fatigue x 2 weeks. She denies cough or fever or chest congestion. She states these are the same feelings she had when she had PNA a few years ago. Pt requesting an appt with Dr. Maple Hudson. Please advise , no open slots. Carron Curie, CMA Allergies  Allergen Reactions  . Erythromycin

## 2010-11-10 NOTE — Telephone Encounter (Signed)
Please ask her to come for outpatient CXR and CBC w/ diff, then script for Avelox 400 mg, # 7, 1 daily

## 2010-11-11 ENCOUNTER — Ambulatory Visit (INDEPENDENT_AMBULATORY_CARE_PROVIDER_SITE_OTHER)
Admission: RE | Admit: 2010-11-11 | Discharge: 2010-11-11 | Disposition: A | Discharge status: Home / Self Care | Payer: Self-pay | Source: Ambulatory Visit | Attending: Internal Medicine | Admitting: Internal Medicine

## 2010-11-11 ENCOUNTER — Other Ambulatory Visit (INDEPENDENT_AMBULATORY_CARE_PROVIDER_SITE_OTHER): Payer: Medicare Other

## 2010-11-11 DIAGNOSIS — R5383 Other fatigue: Secondary | ICD-10-CM

## 2010-11-11 DIAGNOSIS — R079 Chest pain, unspecified: Secondary | ICD-10-CM

## 2010-11-11 LAB — CBC WITH DIFFERENTIAL/PLATELET
Basophils Absolute: 0 10*3/uL (ref 0.0–0.1)
Eosinophils Absolute: 0.1 10*3/uL (ref 0.0–0.7)
HCT: 40.6 % (ref 36.0–46.0)
Hemoglobin: 13.8 g/dL (ref 12.0–15.0)
Lymphs Abs: 1.5 10*3/uL (ref 0.7–4.0)
MCHC: 33.9 g/dL (ref 30.0–36.0)
Monocytes Absolute: 0.5 10*3/uL (ref 0.1–1.0)
Neutro Abs: 4.4 10*3/uL (ref 1.4–7.7)
Platelets: 268 10*3/uL (ref 150.0–400.0)
RDW: 12.3 % (ref 11.5–14.6)

## 2010-11-11 MED ORDER — MOXIFLOXACIN HCL 400 MG PO TABS: 400.0000 mg | ORAL_TABLET | Freq: Every day | ORAL | Status: AC

## 2010-11-11 NOTE — Telephone Encounter (Signed)
Pt aware of recs and will come in today for cxr and labs. I advised to not start meds until after cxr and labs. Carron Curie, CMA

## 2010-11-13 ENCOUNTER — Telehealth: Payer: Self-pay | Admitting: Internal Medicine

## 2010-11-13 NOTE — Progress Notes (Signed)
Quick Note:  LMTCB ______ 

## 2010-11-13 NOTE — Telephone Encounter (Signed)
Pt aware CXR and CBC are normal per Dr. Maple Hudson.

## 2011-01-28 LAB — BODY FLUID CULTURE

## 2011-01-28 LAB — ANAEROBIC CULTURE

## 2011-01-28 LAB — LACTATE DEHYDROGENASE, PLEURAL OR PERITONEAL FLUID: LD, Fluid: 179 — ABNORMAL HIGH

## 2011-01-28 LAB — BODY FLUID CELL COUNT WITH DIFFERENTIAL: Eos, Fluid: 1

## 2011-01-28 LAB — FUNGUS CULTURE W SMEAR

## 2011-02-03 ENCOUNTER — Ambulatory Visit (INDEPENDENT_AMBULATORY_CARE_PROVIDER_SITE_OTHER): Payer: Medicare Other

## 2011-02-03 DIAGNOSIS — J309 Allergic rhinitis, unspecified: Secondary | ICD-10-CM

## 2011-02-04 ENCOUNTER — Telehealth: Payer: Self-pay | Admitting: *Deleted

## 2011-02-04 NOTE — Telephone Encounter (Signed)
Pt. Called wanting to order vac.,her last order was 05-02-10. I asked her how long it had been since her last shot? She told me two months. I made 1:10 giving her build-up instructions(0.1 to 0.5) per Dr.Young's last recommendation(computer said 1:50,card said 1:10) We make up what's written on the card.

## 2011-02-12 LAB — POCT HEMOGLOBIN-HEMACUE
Hemoglobin: 15.4 — ABNORMAL HIGH
Operator id: 268271

## 2011-03-23 ENCOUNTER — Encounter: Payer: Self-pay | Admitting: Internal Medicine

## 2011-03-23 ENCOUNTER — Ambulatory Visit (INDEPENDENT_AMBULATORY_CARE_PROVIDER_SITE_OTHER): Payer: Medicare Other | Admitting: Internal Medicine

## 2011-03-23 VITALS — BP 130/82 | HR 70 | Ht 62.5 in | Wt 157.0 lb

## 2011-03-23 DIAGNOSIS — J309 Allergic rhinitis, unspecified: Secondary | ICD-10-CM

## 2011-03-23 MED ORDER — EPINEPHRINE 0.3 MG/0.3ML IJ DEVI: 0.3000 mg | Freq: Once | INTRAMUSCULAR | Status: AC

## 2011-03-23 NOTE — Progress Notes (Signed)
03/22/11- 77 yoF never smoker followed for allergic rhinitis complicated by history of acute bronchitis, pneumonia, pleural effusion. LOV-03/20/2010 She feels that she is doing well at that allergy vaccine definitely helps her. She asks for a sample of nasal spray today, saying that just since fall leaves had accumulated, she's had more nasal congestion. She denies chest tightness or wheeze, purulent discharge or headache.  ROS-see HPI Constitutional:   No-   weight loss, night sweats, fevers, chills, fatigue, lassitude. HEENT:   No-  headaches, difficulty swallowing, tooth/dental problems, sore throat,       Recent  sneezing, itching, ear ache, nasal congestion, post nasal drip,  CV:  No-   chest pain, orthopnea, PND, swelling in lower extremities, anasarca,                                  dizziness, palpitations Resp: No-   shortness of breath with exertion or at rest.              No-   productive cough,  No non-productive cough,  No- coughing up of blood.              No-   change in color of mucus.  No- wheezing.   Skin: No-   rash or lesions. GI:  No-   heartburn, indigestion, abdominal pain, nausea, vomiting, diarrhea,                 change in bowel habits, loss of appetite GU: No-   dysuria, change in color of urine, no urgency or frequency.  No- flank pain. MS:  No-   joint pain or swelling.  No- decreased range of motion.  No- back pain. Neuro-     nothing unusual Psych:  No- change in mood or affect. No depression or anxiety.  No memory loss.   OBJ General- Alert, Oriented, Affect-appropriate, Distress- none acute Skin- rash-none, lesions- none, excoriation- none Lymphadenopathy- none Head- atraumatic            Eyes- Gross vision intact, PERRLA, conjunctivae clear secretions            Ears- Hearing, canals-normal            Nose- mild turbinate edema, no-Septal dev, mucus, polyps, erosion, perforation             Throat- Mallampati IV , mucosa clear , drainage- none,  tonsils- atrophic Neck- flexible , trachea midline, no stridor , thyroid nl, carotid no bruit Chest - symmetrical excursion , unlabored           Heart/CV- RRR , no murmur , no gallop  , no rub, nl s1 s2                           - JVD- none , edema- none, stasis changes- none, varices- none           Lung- clear to P&A, wheeze- none, cough- none , dullness-none, rub- none           Chest wall-  Abd- tender-no, distended-no, bowel sounds-present, HSM- no Br/ Gen/ Rectal- Not done, not indicated Extrem- cyanosis- none, clubbing, none, atrophy- none, strength- nl Neuro- grossly intact to observation

## 2011-03-23 NOTE — Patient Instructions (Addendum)
Ok to continue allergy vaccine at 1:10   Please call as needed  Refill script Epipen  Sample nasal steroid spray- Nasonex or as available-  2 puffs into each nostril, once daily at bedtime.

## 2011-03-24 NOTE — Assessment & Plan Note (Signed)
She is pleased with allergy vaccine and choosing to continue.

## 2011-05-25 ENCOUNTER — Other Ambulatory Visit: Payer: Self-pay | Admitting: Gynecology

## 2012-03-29 ENCOUNTER — Other Ambulatory Visit: Payer: Self-pay | Admitting: Gynecology

## 2012-03-29 DIAGNOSIS — Z1231 Encounter for screening mammogram for malignant neoplasm of breast: Secondary | ICD-10-CM

## 2012-05-10 ENCOUNTER — Ambulatory Visit
Admission: RE | Admit: 2012-05-10 | Discharge: 2012-05-10 | Disposition: A | Discharge status: Home / Self Care | Payer: Medicare Other | Source: Ambulatory Visit | Attending: Gynecology | Admitting: Gynecology

## 2012-05-10 DIAGNOSIS — Z1231 Encounter for screening mammogram for malignant neoplasm of breast: Secondary | ICD-10-CM

## 2012-05-12 ENCOUNTER — Other Ambulatory Visit: Payer: Self-pay | Admitting: Gynecology

## 2012-05-12 DIAGNOSIS — R928 Other abnormal and inconclusive findings on diagnostic imaging of breast: Secondary | ICD-10-CM

## 2012-05-17 ENCOUNTER — Encounter: Payer: Self-pay | Admitting: Internal Medicine

## 2012-05-17 ENCOUNTER — Ambulatory Visit (INDEPENDENT_AMBULATORY_CARE_PROVIDER_SITE_OTHER): Payer: Medicare Other | Admitting: Internal Medicine

## 2012-05-17 VITALS — BP 124/66 | HR 73 | Ht 62.5 in | Wt 149.6 lb

## 2012-05-17 DIAGNOSIS — R0989 Other specified symptoms and signs involving the circulatory and respiratory systems: Secondary | ICD-10-CM

## 2012-05-17 DIAGNOSIS — R0609 Other forms of dyspnea: Secondary | ICD-10-CM

## 2012-05-17 DIAGNOSIS — J309 Allergic rhinitis, unspecified: Secondary | ICD-10-CM

## 2012-05-17 MED ORDER — METHYLPREDNISOLONE ACETATE 80 MG/ML IJ SUSP
80.0000 mg | Freq: Once | INTRAMUSCULAR | Status: AC
  Administered 2012-05-17: 80 mg via INTRAMUSCULAR

## 2012-05-17 MED ORDER — PHENYLEPHRINE HCL 1 % NA SOLN
3.0000 [drp] | Freq: Once | NASAL | Status: AC
  Administered 2012-05-17: 3 [drp] via NASAL

## 2012-05-17 NOTE — Patient Instructions (Addendum)
Neb neo nasal   Depo 80  Order- schedule PFT

## 2012-05-17 NOTE — Progress Notes (Signed)
03/22/11- 77 yoF never smoker followed for allergic rhinitis complicated by history of acute bronchitis, pneumonia, pleural effusion. LOV-03/20/2010 She feels that she is doing well and that allergy vaccine definitely helps her. She asks for a sample of nasal spray today, saying that just since fall leaves had accumulated, she's had more nasal congestion. She denies chest tightness or wheeze, purulent discharge or headache.  05/17/12-77 yoF never smoker followed for allergic rhinitis complicated by history of acute bronchitis, pneumonia, pleural effusion. FOLLOWS FOR: needs start back on allergy vaccine-not had an injection for about 6 months. Staying stopped up at this time Some nasal congestion around floors being sanded where she wasn't wearing a mask. More aware of dyspnea on hills and stairs without chest pain, palpitation or wheeze.  ROS-see HPI Constitutional:   No-   weight loss, night sweats, fevers, chills, fatigue, lassitude. HEENT:   No-  headaches, difficulty swallowing, tooth/dental problems, sore throat,       Recent  sneezing, itching, ear ache, nasal congestion, post nasal drip,  CV:  No-   chest pain, orthopnea, PND, swelling in lower extremities, anasarca,                                  dizziness, palpitations Resp: + shortness of breath with exertion or at rest.              No-   productive cough,  No non-productive cough,  No- coughing up of blood.              No-   change in color of mucus.  No- wheezing.   Skin: No-   rash or lesions. GI:  No-   heartburn, indigestion, abdominal pain, nausea, vomiting,  GU:  MS:  No-   joint pain or swelling.   Neuro-     nothing unusual Psych:  No- change in mood or affect. No depression or anxiety.  No memory loss.   OBJ General- Alert, Oriented, Affect-appropriate, Distress- none acute Skin- rash-none, lesions- none, excoriation- none Lymphadenopathy- none Head- atraumatic            Eyes- Gross vision intact, PERRLA,  conjunctivae clear secretions            Ears- Hearing, canals-normal            Nose- +mild turbinate edema, no-Septal dev, mucus, polyps, erosion, perforation             Throat- Mallampati IV , mucosa clear , drainage- none, tonsils- atrophic Neck- flexible , trachea midline, no stridor , thyroid nl, carotid no bruit Chest - symmetrical excursion , unlabored           Heart/CV- RRR , no murmur , no gallop  , no rub, nl s1 s2                           - JVD- none , edema- none, stasis changes- none, varices- none           Lung- clear to P&A, wheeze- none, cough- none , dullness-none, rub- none           Chest wall-  Abd-  Br/ Gen/ Rectal- Not done, not indicated Extrem- cyanosis- none, clubbing, none, atrophy- none, strength- nl Neuro- grossly intact to observation

## 2012-05-29 NOTE — Assessment & Plan Note (Signed)
Okay to observe off of allergy vaccine restarted if needed. Plan-nasal nebulizer decongestant, Depo-Medrol

## 2012-05-29 NOTE — Assessment & Plan Note (Signed)
Nonspecific. Plan-PFT

## 2012-05-30 ENCOUNTER — Ambulatory Visit
Admission: RE | Admit: 2012-05-30 | Discharge: 2012-05-30 | Disposition: A | Discharge status: Home / Self Care | Payer: Medicare Other | Source: Ambulatory Visit | Attending: Gynecology | Admitting: Gynecology

## 2012-05-30 DIAGNOSIS — R928 Other abnormal and inconclusive findings on diagnostic imaging of breast: Secondary | ICD-10-CM

## 2012-06-06 ENCOUNTER — Ambulatory Visit (INDEPENDENT_AMBULATORY_CARE_PROVIDER_SITE_OTHER): Payer: Medicare Other | Admitting: Internal Medicine

## 2012-06-06 DIAGNOSIS — R0609 Other forms of dyspnea: Secondary | ICD-10-CM

## 2012-06-06 DIAGNOSIS — R0989 Other specified symptoms and signs involving the circulatory and respiratory systems: Secondary | ICD-10-CM

## 2012-06-06 LAB — PULMONARY FUNCTION TEST

## 2012-06-06 NOTE — Progress Notes (Signed)
PFT done today. 

## 2012-07-15 ENCOUNTER — Encounter: Payer: Self-pay | Admitting: Internal Medicine

## 2012-07-15 ENCOUNTER — Ambulatory Visit (INDEPENDENT_AMBULATORY_CARE_PROVIDER_SITE_OTHER): Payer: Medicare Other | Admitting: Internal Medicine

## 2012-07-15 VITALS — BP 120/78 | HR 77 | Ht 62.25 in | Wt 148.2 lb

## 2012-07-15 DIAGNOSIS — J309 Allergic rhinitis, unspecified: Secondary | ICD-10-CM

## 2012-07-15 DIAGNOSIS — J302 Other seasonal allergic rhinitis: Secondary | ICD-10-CM

## 2012-07-15 MED ORDER — AZELASTINE-FLUTICASONE 137-50 MCG/ACT NA SUSP: 1.0000 | Freq: Every evening | NASAL | Status: DC | PRN

## 2012-07-15 NOTE — Patient Instructions (Addendum)
Sample of Dymista nasal spray    Try 1-2 puffs each nostril once each day at bedtime    Try this instead of Nasonex  You might find you like the otc  nasal spray Nasacort  I will review your allergy vaccine record and the allergy lab will call you when your vaccine is ready to re-start.

## 2012-07-15 NOTE — Progress Notes (Signed)
03/22/11- 77 yoF never smoker followed for allergic rhinitis complicated by history of acute bronchitis, pneumonia, pleural effusion. LOV-03/20/2010 She feels that she is doing well and that allergy vaccine definitely helps her. She asks for a sample of nasal spray today, saying that just since fall leaves had accumulated, she's had more nasal congestion. She denies chest tightness or wheeze, purulent discharge or headache.  05/17/12-77 yoF never smoker followed for allergic rhinitis complicated by history of acute bronchitis, pneumonia, pleural effusion. FOLLOWS FOR: needs start back on allergy vaccine-not had an injection for about 6 months. Staying stopped up at this time Some nasal congestion around floors being sanded where she wasn't wearing a mask. More aware of dyspnea on hills and stairs without chest pain, palpitation or wheeze.  07/15/12- 77 yoF never smoker followed for allergic rhinitis complicated by history of acute bronchitis, pneumonia, pleural effusion. FOLLOWS AVW:[id number] PFT results with patient; stuffy nose-feels like she needs to go back on vaccine. She had been on 1:10 GO. She is not using Nasonex but liked it. Allergy vaccine had been based on skin testing 03/25/2007, reviewed. PFT: 06/06/2012-normal with slight response to bronchodilator. Lung volumes not valid. Diffusion normal. FVC 3.13/122%, FEV1 2.46/139%, FEV1/FVC 0.78. DLCO 87%..  ROS-see HPI Constitutional:   No-   weight loss, night sweats, fevers, chills, fatigue, lassitude. HEENT:   No-  headaches, difficulty swallowing, tooth/dental problems, sore throat,       Recent  sneezing, itching, ear ache, +nasal congestion, post nasal drip,  CV:  No-   chest pain, orthopnea, PND, swelling in lower extremities, anasarca,                                  dizziness, palpitations Resp: + shortness of breath with exertion or at rest.              No-   productive cough,  No non-productive cough,  No- coughing up of blood.              No-   change in color of mucus.  No- wheezing.   Skin: No-   rash or lesions. GI:  No-   heartburn, indigestion, abdominal pain, nausea, vomiting,  GU:  MS:  No-   joint pain or swelling.   Neuro-     nothing unusual Psych:  No- change in mood or affect. No depression or anxiety.  No memory loss. OBJ General- Alert, Oriented, Affect-appropriate, Distress- none acute Skin- rash-none, lesions- none, excoriation- none Lymphadenopathy- none Head- atraumatic            Eyes- Gross vision intact, PERRLA, conjunctivae clear secretions            Ears- Hearing, canals-normal            Nose- +mild turbinate edema, no-Septal dev, +mucosa- red, no-polyps, erosion, perforation             Throat- Mallampati III , mucosa clear , drainage- none, tonsils- atrophic Neck- flexible , trachea midline, no stridor , thyroid nl, carotid no bruit Chest - symmetrical excursion , unlabored           Heart/CV- RRR , no murmur , no gallop  , no rub, nl s1 s2                           - JVD- none , edema- none, stasis changes-  none, varices- none           Lung- clear to P&A, wheeze- none, cough- none , dullness-none, rub- none           Chest wall-  Abd-  Br/ Gen/ Rectal- Not done, not indicated Extrem- cyanosis- none, clubbing, none, atrophy- none, strength- nl Neuro- grossly intact to observation

## 2012-07-19 NOTE — Assessment & Plan Note (Signed)
Discussed whether to retest or restart with previous vaccine. She had been doing very well with no environmental exposure changes. I think we can restart her previous vaccine. I will talk with her about retesting first.  Plan restart and rebuild vaccine

## 2012-07-27 ENCOUNTER — Ambulatory Visit (INDEPENDENT_AMBULATORY_CARE_PROVIDER_SITE_OTHER): Payer: Medicare Other

## 2012-07-27 DIAGNOSIS — J309 Allergic rhinitis, unspecified: Secondary | ICD-10-CM

## 2012-07-28 ENCOUNTER — Telehealth: Payer: Self-pay | Admitting: Internal Medicine

## 2012-07-28 NOTE — Telephone Encounter (Signed)
I called Sara Charles to let her know her vaccine is ready. She said she would come pick it up. I told to her you had on the rx for her to start here. Please advise what you want pt. To do.

## 2012-07-28 NOTE — Telephone Encounter (Signed)
Dr.Young's note he wrote on the printed ph. Note read: Give first shot here,then let her take it with her. Pt. Has given her own shots before. O.K. I will have pt. Sign a wavier and give her the necessary paperwork. When [email or web address] in.

## 2012-07-29 ENCOUNTER — Ambulatory Visit (INDEPENDENT_AMBULATORY_CARE_PROVIDER_SITE_OTHER): Payer: Medicare Other

## 2012-07-29 DIAGNOSIS — J309 Allergic rhinitis, unspecified: Secondary | ICD-10-CM

## 2012-08-15 ENCOUNTER — Encounter: Payer: Self-pay | Admitting: Internal Medicine

## 2012-09-07 ENCOUNTER — Ambulatory Visit: Payer: Medicare Other | Admitting: Internal Medicine

## 2012-10-20 ENCOUNTER — Ambulatory Visit: Payer: Medicare Other | Admitting: Internal Medicine

## 2014-03-08 ENCOUNTER — Encounter: Payer: Self-pay | Admitting: Internal Medicine

## 2015-04-11 ENCOUNTER — Encounter: Payer: Self-pay | Admitting: Internal Medicine

## 2015-04-11 ENCOUNTER — Other Ambulatory Visit: Payer: Self-pay

## 2015-04-11 DIAGNOSIS — Z1231 Encounter for screening mammogram for malignant neoplasm of breast: Secondary | ICD-10-CM

## 2015-04-17 ENCOUNTER — Ambulatory Visit
Admission: RE | Admit: 2015-04-17 | Discharge: 2015-04-17 | Disposition: A | Discharge status: Home / Self Care | Payer: Medicare Other | Source: Ambulatory Visit

## 2015-04-17 DIAGNOSIS — Z1231 Encounter for screening mammogram for malignant neoplasm of breast: Secondary | ICD-10-CM

## 2015-05-01 ENCOUNTER — Ambulatory Visit: Payer: BC Managed Care – PPO

## 2015-09-10 ENCOUNTER — Ambulatory Visit (INDEPENDENT_AMBULATORY_CARE_PROVIDER_SITE_OTHER): Payer: Medicare Other | Admitting: Cardiovascular Disease

## 2015-09-10 ENCOUNTER — Encounter: Payer: Self-pay | Admitting: Cardiovascular Disease

## 2015-09-10 VITALS — BP 130/72 | HR 82 | Ht 62.0 in | Wt 156.0 lb

## 2015-09-10 DIAGNOSIS — K219 Gastro-esophageal reflux disease without esophagitis: Secondary | ICD-10-CM | POA: Diagnosis not present

## 2015-09-10 DIAGNOSIS — R0789 Other chest pain: Secondary | ICD-10-CM | POA: Insufficient documentation

## 2015-09-10 DIAGNOSIS — R011 Cardiac murmur, unspecified: Secondary | ICD-10-CM | POA: Insufficient documentation

## 2015-09-10 HISTORY — DX: Gastro-esophageal reflux disease without esophagitis: K21.9

## 2015-09-10 HISTORY — DX: Cardiac murmur, unspecified: R01.1

## 2015-09-10 HISTORY — DX: Other chest pain: R07.89

## 2015-09-10 NOTE — Progress Notes (Signed)
Cardiology Office Note  Date:  09/10/2015   ID:  Sara Charles, DOB Dec 22, 1933, MRN [id number]  PCP:  Michiel Sites, MD  Cardiologist:   Chilton Si, MD   Chief Complaint  Patient presents with  . New Evaluation    Former pt of Valera Castle, MD--re-establish care with Cardiologist  pt states she has an aneurysm--not sure where it is--found out 10 years ago, never followed up on it.  pt c/o SOB on exertion; occasional dizziness when she gets up or is just standing; pain under left rib cage--has had for years    History of Present Illness: Sara Charles is a 82 y.o. female who presents to establish care.  She was previously a patient of Dr. Daleen Squibb and remembers being told that she had an aneurysm many years ago.  Overall she has felt well.  She notes Occasional discomfort under her left axilla. The episodes last for a couple hours at a time. She has shortness of breath at baseline but it does not change when she has the discomfort. The pain is worse after eating certain foods.  She notes that tomato-based foods are the worst. She denies nausea or diaphoresis. In the past she took the PPI and felt that the symptoms were somewhat better controlled. She has been told that she had an ulcer but is concerned about the long-term term effects of proton pump inhibitors.  The symptoms occur both at rest and with exertion.  She had an abdominal ultrasound in December 2004 that did not show an abdominal aortic aneurysm.  No other imaging in our system demonstrates an aneurysm.  Sara Charles is not exercising much.  She states that this is because she doesn't have the energy.  She is able to clean the house without getting chest pain or shortness of breath.  She tries to eat a healthy diet though she sruggles with sweets.    Past Medical History  Diagnosis Date  . Bronchitis, acute   . Dyspnea on exertion   . Allergic rhinitis   . Exudative pleural effusion 5/09     sterile, benign,  inflammatory  . Systolic murmur 09/10/2015  . Atypical chest pain 09/10/2015  . GERD (gastroesophageal reflux disease) 09/10/2015    Past Surgical History  Procedure Laterality Date  . Thoracentesis left  5/09     Current Outpatient Prescriptions  Medication Sig Dispense Refill  . aspirin (ADULT ASPIRIN EC LOW STRENGTH) 81 MG EC tablet Take 81 mg by mouth daily.      . Azelastine-Fluticasone (DYMISTA) 137-50 MCG/ACT SUSP Place 1 spray into the nose at bedtime and may repeat dose one time if needed. 1 Bottle 0  . co-enzyme Q-10 30 MG capsule Take 30 mg by mouth daily.      Marland Kitchen DIGESTIVE ENZYMES PO Take 1 capsule by mouth daily.    Marland Kitchen ezetimibe (ZETIA) 10 MG tablet Take 10 mg by mouth daily.    . Glucosamine-Chondroit-Vit C-Mn (GLUCOSAMINE 1500 COMPLEX PO) Take by mouth daily.      . mometasone (NASONEX) 50 MCG/ACT nasal spray Place into the nose at bedtime. 1-2 sprays each nostril     . Multiple Minerals-Vitamins (CITRACAL PLUS) TABS Take 1 tablet by mouth daily.     . Multiple Vitamins-Minerals (MULTIVITAL) tablet Take 1 tablet by mouth daily.      . Omega-3 Fatty Acids (FISH OIL) 1000 MG CAPS Take by mouth daily.      . Probiotic Product (PROBIOTIC DAILY PO)  Take 1 capsule by mouth daily.    . rosuvastatin (CRESTOR) 10 MG tablet Take 5 mg by mouth daily.      . vitamin B-12 (CYANOCOBALAMIN) 1000 MCG tablet Take 1,000 mcg by mouth daily.       No current facility-administered medications for this visit.    Allergies:   Erythromycin    Social History:  The patient  reports that she has never smoked. She does not have any smokeless tobacco history on file. She reports that she does not drink alcohol.   Family History:  The patient's family history includes Allergies in her mother; Heart disease (age of onset: 13) in her father; Heart disease (age of onset: 68) in her mother; Thyroid cancer in her brother.    ROS:  Please see the history of present illness.  Otherwise, review of systems  are positive for fatigue.   All other systems are reviewed and negative.    PHYSICAL EXAM: VS:  BP 130/72 mmHg  Pulse 82  Ht 5\' 2"  (1.575 m)  Wt 70.761 kg (156 lb)  BMI 28.53 kg/m2 , BMI Body mass index is 28.53 kg/(m^2). GENERAL:  Well appearing HEENT:  Pupils equal round and reactive, fundi not visualized, oral mucosa unremarkable NECK:  No jugular venous distention, waveform within normal limits, carotid upstroke brisk and symmetric, no bruits, no thyromegaly LYMPHATICS:  No cervical adenopathy LUNGS:  Clear to auscultation bilaterally HEART:  RRR.  PMI not displaced or sustained,S1 and S2 within normal limits, no S3, no S4, no clicks, no rubs I/VI early-peaking systolic murmur at the RUSB ABD:  Flat, positive bowel sounds normal in frequency in pitch, no bruits, no rebound, no guarding, no midline pulsatile mass, no hepatomegaly, no splenomegaly EXT:  2 plus pulses throughout, no edema, no cyanosis no clubbing SKIN:  No rashes no nodules NEURO:  Cranial nerves II through XII grossly intact, motor grossly intact throughout PSYCH:  Cognitively intact, oriented to person place and time   EKG:  EKG is ordered today. The ekg ordered today demonstrates sinus rhythm rate 82 bpm.   Recent Labs: No results found for requested labs within last 365 days.    Lipid Panel No results found for: CHOL, TRIG, HDL, CHOLHDL, VLDL, LDLCALC, LDLDIRECT    Wt Readings from Last 3 Encounters:  09/10/15 70.761 kg (156 lb)  07/15/12 67.223 kg (148 lb 3.2 oz)  05/17/12 67.858 kg (149 lb 9.6 oz)      ASSESSMENT AND PLAN:  # Murmur:  # Aneurysm: Sara Charles has a mild systolic murmur on exam.  She does not recall being told that she had a murmur in the past.  We will Obtain an echocardiogram to evaluate her murmur. This also showed she has an ascending aortic aneurysm. Her prior abdominal ultrasound did not reveal any abdominal aortic aneurysm. This was performed in 2004. If her echo is  unremarkable we will consider getting an abdominal ultrasound to rule out abdominal aortic aneurysm.  She does not have any evidence of heart failure on exam.  # GERD: Sara Charles chest discomfort seems to be most likely due to gastroesophageal reflux disease. She describes symptoms that are worse after eating tomato-based foods. I suggested that she not lay down soon after eating and try to elevate the head of her bed. She is reluctant to start a proton pump inhibitor due to things that she has red on the Internet. She does report having a gastric ulcer and has not been seen by  gastroenterologist in some time. She requests to return to the [facility].  # Hyperlipidemia: Continue rosuvastatin and Zetia. Lipids are managed by her PCP.  Current medicines are reviewed at length with the patient today.  The patient does not have concerns regarding medicines.  The following changes have been made:  no change  Labs/ tests ordered today include:   Orders Placed This Encounter  Procedures  . Ambulatory referral to Gastroenterology  . EKG 12-Lead  . ECHOCARDIOGRAM COMPLETE     Disposition:   FU with Bernardine Langworthy C. Duke Salviaandolph, MD, [facility] in 6 months.    This note was written with the assistance of speech recognition software.  Please excuse any transcriptional errors.  Signed, Danyetta Gillham C. Duke Salviaandolph, MD, [facility]  09/10/2015 5:49 PM    [facility]

## 2015-09-10 NOTE — Patient Instructions (Addendum)
Medication Instructions:  Your physician recommends that you continue on your current medications as directed. Please refer to the Current Medication list given to you today.  Labwork: none  Testing/Procedures: Your physician has requested that you have an echocardiogram. Echocardiography is a painless test that uses sound waves to create images of your heart. It provides your doctor with information about the size and shape of your heart and how well your heart's chambers and valves are working. This procedure takes approximately one hour. There are no restrictions for this procedure. [facility] [address]  Follow-Up: Your physician wants you to follow-up in: 6 month  You will receive a reminder letter in the mail two months in advance. If you don't receive a letter, please call our office to schedule the follow-up appointment.  Any Other Special Instructions Will Be Listed Below (If Applicable). You have been referred to Dr Rhea BeltonPyrtle   If you need a refill on your cardiac medications before your next appointment, please call your pharmacy.

## 2015-09-13 ENCOUNTER — Telehealth: Payer: Self-pay | Admitting: Cardiovascular Disease

## 2015-09-13 NOTE — Telephone Encounter (Signed)
New message   Pt verbalized that she wants to speak to rn

## 2015-09-13 NOTE — Telephone Encounter (Signed)
Spoke with patient and she has not heard from Dr Rhea BeltonPyrtle GI Gave her the number and she will call them directly Advised to call back if any problems

## 2015-09-24 ENCOUNTER — Other Ambulatory Visit (HOSPITAL_COMMUNITY): Payer: Medicare Other

## 2015-09-26 ENCOUNTER — Ambulatory Visit (HOSPITAL_COMMUNITY): Payer: Medicare Other | Attending: Cardiovascular Disease

## 2015-09-26 ENCOUNTER — Other Ambulatory Visit: Payer: Self-pay

## 2015-09-26 DIAGNOSIS — I351 Nonrheumatic aortic (valve) insufficiency: Secondary | ICD-10-CM | POA: Diagnosis not present

## 2015-09-26 DIAGNOSIS — R011 Cardiac murmur, unspecified: Secondary | ICD-10-CM | POA: Diagnosis present

## 2015-09-26 DIAGNOSIS — I517 Cardiomegaly: Secondary | ICD-10-CM | POA: Diagnosis not present

## 2016-03-23 ENCOUNTER — Other Ambulatory Visit: Payer: Self-pay | Admitting: Endocrinology

## 2016-03-23 DIAGNOSIS — Z1231 Encounter for screening mammogram for malignant neoplasm of breast: Secondary | ICD-10-CM

## 2016-04-20 ENCOUNTER — Ambulatory Visit
Admission: RE | Admit: 2016-04-20 | Discharge: 2016-04-20 | Disposition: A | Discharge status: Home / Self Care | Payer: Medicare Other | Source: Ambulatory Visit | Attending: Endocrinology | Admitting: Endocrinology

## 2016-04-20 DIAGNOSIS — Z1231 Encounter for screening mammogram for malignant neoplasm of breast: Secondary | ICD-10-CM

## 2016-05-19 LAB — COLOGUARD: Cologuard: POSITIVE

## 2016-05-28 ENCOUNTER — Encounter: Payer: Self-pay | Admitting: Physician Assistant

## 2016-06-08 ENCOUNTER — Ambulatory Visit (INDEPENDENT_AMBULATORY_CARE_PROVIDER_SITE_OTHER): Payer: Medicare Other | Admitting: Physician Assistant

## 2016-06-08 ENCOUNTER — Encounter: Payer: Self-pay | Admitting: Physician Assistant

## 2016-06-08 ENCOUNTER — Encounter (INDEPENDENT_AMBULATORY_CARE_PROVIDER_SITE_OTHER): Payer: Self-pay

## 2016-06-08 VITALS — BP 132/80 | HR 70 | Ht 62.0 in | Wt 155.2 lb

## 2016-06-08 DIAGNOSIS — Q438 Other specified congenital malformations of intestine: Secondary | ICD-10-CM | POA: Diagnosis not present

## 2016-06-08 DIAGNOSIS — R195 Other fecal abnormalities: Secondary | ICD-10-CM

## 2016-06-08 DIAGNOSIS — R1310 Dysphagia, unspecified: Secondary | ICD-10-CM | POA: Diagnosis not present

## 2016-06-08 MED ORDER — RANITIDINE HCL 150 MG PO TABS: 150.0000 mg | ORAL_TABLET | Freq: Two times a day (BID) | ORAL | 6 refills | Status: DC

## 2016-06-08 NOTE — Patient Instructions (Addendum)
You have been scheduled for a [facility]ium Esophogram [facility] (1st floor of the hospital) on 2-22 at 10;30 am. Please arrive 15 minutes prior to your appointment for registration. Make certain not to have anything to eat or drink 3 hours prior to your test. If you need to reschedule for any reason, please contact radiology at [phone] to do so. __________________________________________________________________ A barium swallow is an examination that concentrates on views of the esophagus. This tends to be a double contrast exam (barium and two liquids which, when combined, create a gas to distend the wall of the oesophagus) or single contrast (non-ionic iodine based). The study is usually tailored to your symptoms so a good history is essential. Attention is paid during the study to the form, structure and configuration of the esophagus, looking for functional disorders (such as aspiration, dysphagia, achalasia, motility and reflux) EXAMINATION You may be asked to change into a gown, depending on the type of swallow being performed. A radiologist and radiographer will perform the procedure. The radiologist will advise you of the type of contrast selected for your procedure and direct you during the exam. You will be asked to stand, sit or lie in several different positions and to hold a small amount of fluid in your mouth before being asked to swallow while the imaging is performed .In some instances you may be asked to swallow barium coated marshmallows to assess the motility of a solid food bolus. The exam can be recorded as a digital or video fluoroscopy procedure. POST PROCEDURE It will take 1-2 days for the barium to pass through your system. To facilitate this, it is important, unless otherwise directed, to increase your fluids for the next 24-48hrs and to resume your normal diet.  This test typically takes about 30 minutes to  perform. __________________________________________________________________________________ For the Virtual colonoscopies. [facility] by 06-12-2016.  Pick up the prep and instructions.  They will give you a list of instrucitons.   [address]  [address] first floor.

## 2016-06-08 NOTE — Progress Notes (Addendum)
Subjective:    Patient ID: Sara Charles, female    DOB: [birth year], 83 y.o.   MRN: [id number]  HPI Lewanda RifeClara is a pleasant 83 year old white female referred today by Dr. Darci NeedleWalter Kohut for positive Cologuard stool DNA test. Patient is asymptomatic, has no complaints of abdominal pain changes in bowel habits melena or hematochezia. She says she does have occasional constipation. Patient is known remotely to Dr. Terrial RhodesSamuel Erick and had previous incomplete colonoscopy done in 2000 due to tortuous redundant colon. She reports that she was told that she could not have any further colonoscopies. She underwent virtual colonoscopy in 2008 each was a negative exam. Patient has no family history of colon cancer. Otherwise she is in fairly good health does have history of GERD and IBS. She also describes some solid food dysphagia which she says she has had for a long time. This is primarily to pills but occasionally has sensation of dysphagia with solid food. She is currently using omeprazole 20 mg by mouth every morning she prefers not to stay on omeprazole because of all the bad thing she has read about it. She says it does help control heartburn.   Review of Systems Pertinent positive and negative review of systems were noted in the above HPI section.  All other review of systems was otherwise negative.  Outpatient Encounter Prescriptions as of 06/08/2016  Medication Sig  . aspirin (ADULT ASPIRIN EC LOW STRENGTH) 81 MG EC tablet Take 81 mg by mouth daily.    . Azelastine-Fluticasone (DYMISTA) 137-50 MCG/ACT SUSP Place 1 spray into the nose at bedtime and may repeat dose one time if needed.  Marland Kitchen. co-enzyme Q-10 30 MG capsule Take 30 mg by mouth daily.    Marland Kitchen. DIGESTIVE ENZYMES PO Take 1 capsule by mouth daily.  Marland Kitchen. ezetimibe (ZETIA) 10 MG tablet Take 10 mg by mouth daily.  . Glucosamine-Chondroit-Vit C-Mn (GLUCOSAMINE 1500 COMPLEX PO) Take by mouth daily.    . mometasone (NASONEX) 50 MCG/ACT nasal spray Place into  the nose at bedtime. 1-2 sprays each nostril   . Multiple Minerals-Vitamins (CITRACAL PLUS) TABS Take 1 tablet by mouth daily.   . Multiple Vitamins-Minerals (MULTIVITAL) tablet Take 1 tablet by mouth daily.    . Omega-3 Fatty Acids (FISH OIL) 1000 MG CAPS Take by mouth daily.    . Probiotic Product (PROBIOTIC DAILY PO) Take 1 capsule by mouth daily.  . rosuvastatin (CRESTOR) 10 MG tablet Take 5 mg by mouth daily.    . vitamin B-12 (CYANOCOBALAMIN) 1000 MCG tablet Take 1,000 mcg by mouth daily.    . ranitidine (ZANTAC) 150 MG tablet Take 1 tablet (150 mg total) by mouth 2 (two) times daily.   No facility-administered encounter medications on file as of 06/08/2016.    Allergies  Allergen Reactions  . Erythromycin    Patient Active Problem List   Diagnosis Date Noted  . Systolic murmur 09/10/2015  . Atypical chest pain 09/10/2015  . GERD (gastroesophageal reflux disease) 09/10/2015  . PNEUMONIA 09/13/2007  . EFFUSION, PLEURAL 09/13/2007  . BRONCHITIS, ACUTE 09/12/2007  . Seasonal and perennial allergic rhinitis 04/20/2007  . DYSPNEA ON EXERTION 04/20/2007   Social History   Social History  . Marital status: Single    Spouse name: N/A  . Number of children: N/A  . Years of education: N/A   Occupational History  . Not on file.   Social History Main Topics  . Smoking status: Never Smoker  . Smokeless tobacco: Never Used  .  Alcohol use No  . Drug use: Unknown  . Sexual activity: Not on file   Other Topics Concern  . 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     Ms. Mallery family history includes Allergies in her mother; Heart disease (age of onset: 11) in her father; Heart disease (age of onset: 67) in her mother; Thyroid cancer in her brother.      Objective:    Vitals:   06/08/16 1326  BP: 132/80  Pulse: 70    Physical Exam  well-developed elderly white female in no acute distress, pleasant, P her shoulder than stated age  blood pressure 132/80 pulse 70, height 5 foot 2, weight 155, BMI 28.3. HEENT; nontraumatic normocephalic EOMI PERRLA sclera anicteric, Cardiovascular ;rhythm with S1-S2 no murmur or gallop, Pulmonary; clear bilaterally, Abdomen ;soft, nontender nondistended bowel sounds are active there is no palpable mass or hepatosplenomegaly previous appendectomy scar, rectal exam not done, Ext; is no clubbing cyanosis or edema skin warm and dry, Neuropsych; mood and affect appropriate       Assessment & Plan:   #12 83 year old white female with positive Cologuard   DNA test  Rule out occult colon lesion or adenomatous polyps #2 tortuous redundant colon with previous incomplete colonoscopy 2000, negative virtual colonoscopy 2008 #3 GERD with pill dysphagia -rule out early stricture  Plan; Long discussion with patient regarding options. She would like to proceed with virtual colonoscopy first ,and if this is positive would be agreeable to standard colonoscopy. We'll schedule for barium swallow with a tablet She will try Zantac 150 mg by mouth twice a day for GERD, instead of omeprazole. Prescription sent Patient will be established with Dr. Leone Payor.   Cristiano Capri Oswald Hillock PA-C 06/08/2016  Tough situation in some respects. I would say that newer scopes may allow for completion of colonoscopy as well as better sedation. Will see what CT colonoscopy shows as well as the Ba swallow.  Iva Boop, MD, [facility]   Cc: Darci Needle, MD

## 2016-06-18 ENCOUNTER — Ambulatory Visit
Admission: RE | Admit: 2016-06-18 | Discharge: 2016-06-18 | Disposition: A | Discharge status: Home / Self Care | Payer: Medicare Other | Source: Ambulatory Visit | Attending: Physician Assistant | Admitting: Physician Assistant

## 2016-06-18 DIAGNOSIS — R195 Other fecal abnormalities: Secondary | ICD-10-CM

## 2016-06-18 DIAGNOSIS — Q438 Other specified congenital malformations of intestine: Secondary | ICD-10-CM

## 2016-06-18 DIAGNOSIS — R1310 Dysphagia, unspecified: Secondary | ICD-10-CM

## 2016-06-18 NOTE — Progress Notes (Signed)
I would not work this up any further as long as she understands that could still be hiding a cancer (very unlikely). Ultimately its her decision.

## 2016-06-25 ENCOUNTER — Ambulatory Visit (HOSPITAL_COMMUNITY)
Admission: RE | Admit: 2016-06-25 | Discharge: 2016-06-25 | Disposition: A | Discharge status: Home / Self Care | Payer: Medicare Other | Source: Ambulatory Visit | Attending: Physician Assistant | Admitting: Physician Assistant

## 2016-06-25 ENCOUNTER — Telehealth: Payer: Self-pay | Admitting: Physician Assistant

## 2016-06-25 DIAGNOSIS — R1310 Dysphagia, unspecified: Secondary | ICD-10-CM | POA: Diagnosis not present

## 2016-06-25 NOTE — Telephone Encounter (Signed)
Left message to call. All studies were normal. If she wanted to pursue the positive Cologuard further, the next step would be a colonoscopy.

## 2016-07-03 ENCOUNTER — Telehealth: Payer: Self-pay | Admitting: Physician Assistant

## 2016-07-03 NOTE — Telephone Encounter (Signed)
Discuss her imaging results with her. She will discuss pursuing the positive cologuard with her family, but at this point does not want to schedule a colonoscopy.

## 2017-03-11 ENCOUNTER — Ambulatory Visit: Payer: Medicare Other | Admitting: Family Medicine

## 2017-03-11 ENCOUNTER — Encounter: Payer: Self-pay | Admitting: Family Medicine

## 2017-03-11 VITALS — BP 134/80 | HR 80 | Temp 98.1°F | Ht 62.0 in | Wt 155.6 lb

## 2017-03-11 DIAGNOSIS — E559 Vitamin D deficiency, unspecified: Secondary | ICD-10-CM

## 2017-03-11 DIAGNOSIS — Z23 Encounter for immunization: Secondary | ICD-10-CM

## 2017-03-11 DIAGNOSIS — Z13220 Encounter for screening for lipoid disorders: Secondary | ICD-10-CM | POA: Diagnosis not present

## 2017-03-11 DIAGNOSIS — K219 Gastro-esophageal reflux disease without esophagitis: Secondary | ICD-10-CM | POA: Diagnosis not present

## 2017-03-11 DIAGNOSIS — Z131 Encounter for screening for diabetes mellitus: Secondary | ICD-10-CM | POA: Diagnosis not present

## 2017-03-11 DIAGNOSIS — J302 Other seasonal allergic rhinitis: Secondary | ICD-10-CM | POA: Diagnosis not present

## 2017-03-11 NOTE — Progress Notes (Signed)
Patient presents to clinic today for f/u and to establish care.  SUBJECTIVE: PMH: Pt is an 83 yo with pmh sig for GERD and seasonal allergies.  Pt was formerly seen by Dr. Juleen ChinaKohut.    GERD: -pt unsure if taking Zantac 150 mg -Has a bottle of Omeprazole 20 mg tabs, but states is afraid to take it as she read "ppl get addicted to it and it leaches the Calcium out of your bones".  HLD: -currently taking ezetimibe 10 mg and crestor 10 mg -Pt has been on various Statins in the past, but the caused myalgias -Patient interested in the book diet for your blood type  Allergies: Erythromycin-unclear reaction  Past surgical history: Right oophorectomy Appendectomy in 1944  Social history: Patient is single.  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  Patient denies tobacco, alcohol, drug use.  Family medical history: Mom-arthritis, asthma, gastric cancer, DM, MI, heart disease, HLD, HTN Dad-early death at age 81, MI, heart disease, HTN Sister-Carole, thyroid cancer, Lyme disease  Health Maintenance: Dental --Dr. Alvester MorinBell and Dr. Burgess Estelleanner Vision --Dr. Hazle Quantigby Immunizations --interested in flu today, Pneumovax 2017, shingles vaccine 2015  Colonoscopy --2017 per patient she has to get virtual colonoscopy secondary to a tortuous colon. Mammogram --2017 PAP --2007    Past Medical History:  Diagnosis Date  . Allergic rhinitis   . Atypical chest pain 09/10/2015  . Bronchitis, acute   . Dyspnea on exertion   . Exudative pleural effusion 5/09    sterile, benign, inflammatory  . GERD (gastroesophageal reflux disease) 09/10/2015  . Systolic murmur 09/10/2015    Past Surgical History:  Procedure Laterality Date  . APPENDECTOMY    . OOPHORECTOMY Right   . thoracentesis left  5/09    Current Outpatient Medications on File Prior to Visit  Medication Sig Dispense Refill  . aspirin (ADULT ASPIRIN EC LOW STRENGTH) 81 MG EC tablet Take 81 mg by mouth daily.      .  Azelastine-Fluticasone (DYMISTA) 137-50 MCG/ACT SUSP Place 1 spray into the nose at bedtime and may repeat dose one time if needed. 1 Bottle 0  . co-enzyme Q-10 30 MG capsule Take 30 mg by mouth daily.      Marland Kitchen. DIGESTIVE ENZYMES PO Take 1 capsule by mouth daily.    Marland Kitchen. ezetimibe (ZETIA) 10 MG tablet Take 10 mg by mouth daily.    . Glucosamine-Chondroit-Vit C-Mn (GLUCOSAMINE 1500 COMPLEX PO) Take by mouth daily.      . mometasone (NASONEX) 50 MCG/ACT nasal spray Place into the nose at bedtime. 1-2 sprays each nostril     . Omega-3 Fatty Acids (FISH OIL) 1000 MG CAPS Take by mouth daily.      . Probiotic Product (PROBIOTIC DAILY PO) Take 1 capsule by mouth daily.    . ranitidine (ZANTAC) 150 MG tablet Take 1 tablet (150 mg total) by mouth 2 (two) times daily. 60 tablet 6  . rosuvastatin (CRESTOR) 10 MG tablet Take 5 mg by mouth daily.      . vitamin B-12 (CYANOCOBALAMIN) 1000 MCG tablet Take 1,000 mcg by mouth daily.      . Multiple Minerals-Vitamins (CITRACAL PLUS) TABS Take 1 tablet by mouth daily.     . Multiple Vitamins-Minerals (MULTIVITAL) tablet Take 1 tablet by mouth daily.       No current facility-administered medications on file prior to visit.     Allergies  Allergen Reactions  . Erythromycin     Family History  Problem Relation Age of Onset  . Allergies Mother   . Heart disease Mother [id number]  . Cancer Mother   . Heart disease Father [id number]  . Thyroid cancer Sister     Social History   Socioeconomic History  . Marital status: Single    Spouse name: Not on file  . Number of children: Not on file  . Years of education: Not on file  . Highest education level: Not on file  Social Needs  . Financial resource strain: Not on file  . Food insecurity - worry: Not on file  . Food insecurity - inability: Not on file  . Transportation needs - medical: Not on file  . Transportation needs - non-medical: Not on file  Occupational History  . Not on file  Tobacco Use  . Smoking status:  Never Smoker  . Smokeless tobacco: Never Used  Substance and Sexual Activity  . Alcohol use: No  . Drug use: No  . Sexual activity: Not on file  Other Topics Concern  . 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     ROS General: Denies fever, chills, night sweats, changes in weight, changes in appetite HEENT: Denies headaches, ear pain, changes in vision, rhinorrhea, sore throat CV: Denies CP, palpitations, SOB, orthopnea Pulm: Denies SOB, cough, wheezing GI: Denies abdominal pain, nausea, vomiting, diarrhea, constipation GU: Denies dysuria, hematuria, frequency, vaginal discharge Msk: Denies muscle cramps, joint pains Neuro: Denies weakness, numbness, tingling Skin: Denies rashes, bruising Psych: Denies depression, anxiety, hallucinations  BP 134/80 (BP Location: Left Arm, Patient Position: Sitting, Cuff Size: Normal)   Pulse 80   Temp 98.1 F (36.7 C) (Oral)   Ht 5\' 2"  (1.575 m)   Wt 155 lb 9.6 oz (70.6 kg)   BMI 28.46 kg/m   Physical Exam Gen. Pleasant, well developed, well-nourished, in NAD HEENT - /AT, PERRL, EOMI, conjunctive clear, no scleral icterus, no nasal drainage, pharynx without erythema or exudate. Neck: No JVD, no thyromegaly, no carotid bruits Lungs: no use of accessory muscles, no dullness to percussion, CTAB, no wheezes, rales or rhonchi Cardiovascular: RRR, No r/g/m, no peripheral edema Abdomen: BS present, soft, nontender,nondistended, no hepatosplenomegaly Musculoskeletal: No deformities, moves all four extremities, no cyanosis or clubbing, normal tone Neuro:  A&Ox3, CN II-XII intact, normal gait Skin:  Warm, dry, intact, no lesions Psych: normal affect, mood appropriate  No results found for this or any previous visit (from the past 2160 hour(s)).  Assessment/Plan: Gastroesophageal reflux disease, esophagitis presence not specified  -Discussed various medications use for the treatment of GERD -Patient to go  home and clarify which medication she is actually taking daily. Given handout on foods to avoid - Plan: CBC with Differential/Platelet, Comprehensive metabolic panel  Seasonal allergies -Patient to continue Nasonex. -Discussed proper nasal spray use -Given handout  Need for immunization against influenza  - Plan: Flu Vaccine QUAD 36+ mos IM  Screening for cholesterol level  - Plan: Lipid panel  Screening for diabetes mellitus (DM)  - Plan: Hemoglobin A1c  Vitamin D deficiency  - Plan: Vitamin D, 25-hydroxy    Follow-up in the next few weeks for CPE.

## 2017-03-11 NOTE — Patient Instructions (Addendum)
The lab is open each weekday from 7:30 am to 5:30 pm  We have ordered labs at this visit. It can take up to 1-2 weeks for results and processing. If results require follow up or explanation, we will call you with instructions. Clinically stable results will be released to [facility] or sent to you via mail. If you have not heard from us or cannot find your results in [facility] in 2 weeks please contact our office at [phone].     Bone Health Bones protect organs, store calcium, and anchor muscles. Good health habits, such as eating nutritious foods and exercising regularly, are important for maintaining healthy bones. They can also help to prevent a condition that causes bones to lose density and become weak and brittle (osteoporosis). Why is bone mass important? Bone mass refers to the amount of bone tissue that you have. The higher your bone mass, the stronger your bones. An important step toward having healthy bones throughout life is to have strong and dense bones during childhood. A young adult who has a high bone mass is more likely to have a high bone mass later in life. Bone mass at its greatest it is called peak bone mass. A large decline in bone mass occurs in older adults. In women, it occurs about the time of menopause. During this time, it is important to practice good health habits, because if more bone is lost than what is replaced, the bones will become less healthy and more likely to break (fracture). If you find that you have a low bone mass, you may be able to prevent osteoporosis or further bone loss by changing your diet and lifestyle. How can I find out if my bone mass is low? Bone mass can be measured with an X-ray test that is called a bone mineral density (BMD) test. This test is recommended for all women who are age 81 or older. It may also be recommended for men who are age 81 or older, or for people who are more likely to develop osteoporosis due to:  Having bones that  break easily.  Having a long-term disease that weakens bones, such as kidney disease or rheumatoid arthritis.  Having menopause earlier than normal.  Taking medicine that weakens bones, such as steroids, thyroid hormones, or hormone treatment for breast cancer or prostate cancer.  Smoking.  Drinking three or more alcoholic drinks each day.  What are the nutritional recommendations for healthy bones? To have healthy bones, you need to get enough of the right minerals and vitamins. Most nutrition experts recommend getting these nutrients from the foods that you eat. Nutritional recommendations vary from person to person. Ask your health care provider what is healthy for you. Here are some general guidelines. Calcium Recommendations Calcium is the most important (essential) mineral for bone health. Most people can get enough calcium from their diet, but supplements may be recommended for people who are at risk for osteoporosis. Good sources of calcium include:  Dairy products, such as low-fat or nonfat milk, cheese, and yogurt.  Dark green leafy vegetables, such as bok choy and broccoli.  Calcium-fortified foods, such as orange juice, cereal, bread, soy beverages, and tofu products.  Nuts, such as almonds.  Follow these recommended amounts for daily calcium intake:  Children, age 52?3: 700 mg.  Children, age 656?8: 1,000 mg.  Children, age 65?13: 1,300 mg.  Teens, age 524?18: 1,300 mg.  Adults, age 529?50: 1,000 mg.  Adults, age 84?70: ? Men: 1,000  mg. ? Women: 1,200 mg.  Adults, age 81 or older: 1,200 mg.  Pregnant and breastfeeding females: ? Teens: 1,300 mg. ? Adults: 1,000 mg.  Vitamin D Recommendations Vitamin D is the most essential vitamin for bone health. It helps the body to absorb calcium. Sunlight stimulates the skin to make vitamin D, so be sure to get enough sunlight. If you live in a cold climate or you do not get outside often, your health care provider may  recommend that you take vitamin D supplements. Good sources of vitamin D in your diet include:  Egg yolks.  Saltwater fish.  Milk and cereal fortified with vitamin D.  Follow these recommended amounts for daily vitamin D intake:  Children and teens, age 53?18: 600 international units.  Adults, age 81 or younger: 400-800 international units.  Adults, age 53551 or older: 800-1,000 international units.  Other Nutrients Other nutrients for bone health include:  Phosphorus. This mineral is found in meat, poultry, dairy foods, nuts, and legumes. The recommended daily intake for adult men and adult women is 700 mg.  Magnesium. This mineral is found in seeds, nuts, dark green vegetables, and legumes. The recommended daily intake for adult men is 400?420 mg. For adult women, it is 310?320 mg.  Vitamin K. This vitamin is found in green leafy vegetables. The recommended daily intake is 120 mg for adult men and 90 mg for adult women.  What type of physical activity is best for building and maintaining healthy bones? Weight-bearing and strength-building activities are important for building and maintaining peak bone mass. Weight-bearing activities cause muscles and bones to work against gravity. Strength-building activities increases muscle strength that supports bones. Weight-bearing and muscle-building activities include:  Walking and hiking.  Jogging and running.  Dancing.  Gym exercises.  Lifting weights.  Tennis and racquetball.  Climbing stairs.  Aerobics.  Adults should get at least 30 minutes of moderate physical activity on most days. Children should get at least 60 minutes of moderate physical activity on most days. Ask your health care provide what type of exercise is best for you. Where can I find more information? For more information, check out the following websites:  National Osteoporosis Foundation: http://burton-owens.org/http://nof.org/learn/basics  [employer]:  http://www.niams.http://www.johnson-fowler.biz/nih.gov/Health_Info/Bone/Bone_Health/bone_health_for_life.asp  This information is not intended to replace advice given to you by your health care provider. Make sure you discuss any questions you have with your health care provider. Document Released: 07/11/2003 Document Revised: 11/08/2015 Document Reviewed: 04/25/2014 Elsevier Interactive Patient Education  2018 [employer]  Food Choices for Gastroesophageal Reflux Disease, Adult When you have gastroesophageal reflux disease (GERD), the foods you eat and your eating habits are very important. Choosing the right foods can help ease your discomfort. What guidelines do I need to follow?  Choose fruits, vegetables, whole grains, and low-fat dairy products.  Choose low-fat meat, fish, and poultry.  Limit fats such as oils, salad dressings, butter, nuts, and avocado.  Keep a food diary. This helps you identify foods that cause symptoms.  Avoid foods that cause symptoms. These may be different for everyone.  Eat small meals often instead of 3 large meals a day.  Eat your meals slowly, in a place where you are relaxed.  Limit fried foods.  Cook foods using methods other than frying.  Avoid drinking alcohol.  Avoid drinking large amounts of liquids with your meals.  Avoid bending over or lying down until 2-3 hours after eating. What foods are not recommended? These are some foods  and drinks that may make your symptoms worse: Vegetables Tomatoes. Tomato juice. Tomato and spaghetti sauce. Chili peppers. Onion and garlic. Horseradish. Fruits Oranges, grapefruit, and lemon (fruit and juice). Meats High-fat meats, fish, and poultry. This includes hot dogs, ribs, ham, sausage, salami, and bacon. Dairy Whole milk and chocolate milk. Sour cream. Cream. Butter. Ice cream. Cream cheese. Drinks Coffee and tea. Bubbly (carbonated) drinks or energy drinks. Condiments Hot sauce. Barbecue  sauce. Sweets/Desserts Chocolate and cocoa. Donuts. Peppermint and spearmint. Fats and Oils High-fat foods. This includes [place] fries and potato chips. Other Vinegar. Strong spices. This includes black pepper, white pepper, red pepper, cayenne, curry powder, cloves, ginger, and chili powder. The items listed above may not be a complete list of foods and drinks to avoid. Contact your dietitian for more information. This information is not intended to replace advice given to you by your health care provider. Make sure you discuss any questions you have with your health care provider. Document Released: 10/20/2011 Document Revised: 09/26/2015 Document Reviewed: 02/22/2013 Elsevier Interactive Patient Education  2017 Elsevier

## 2017-03-12 ENCOUNTER — Other Ambulatory Visit: Payer: Medicare Other

## 2017-03-12 DIAGNOSIS — Z23 Encounter for immunization: Secondary | ICD-10-CM | POA: Diagnosis not present

## 2017-03-12 LAB — CBC WITH DIFFERENTIAL/PLATELET
BASOS ABS: 0 10*3/uL (ref 0.0–0.1)
Basophils Relative: 0.5 % (ref 0.0–3.0)
EOS PCT: 3.1 % (ref 0.0–5.0)
Eosinophils Absolute: 0.2 10*3/uL (ref 0.0–0.7)
HEMATOCRIT: 44.3 % (ref 36.0–46.0)
HEMOGLOBIN: 15 g/dL (ref 12.0–15.0)
LYMPHS PCT: 23.8 % (ref 12.0–46.0)
Lymphs Abs: 1.4 10*3/uL (ref 0.7–4.0)
MCHC: 33.8 g/dL (ref 30.0–36.0)
MCV: 89 fl (ref 78.0–100.0)
MONOS PCT: 7.5 % (ref 3.0–12.0)
Monocytes Absolute: 0.5 10*3/uL (ref 0.1–1.0)
Neutro Abs: 3.9 10*3/uL (ref 1.4–7.7)
Neutrophils Relative %: 65.1 % (ref 43.0–77.0)
Platelets: 284 10*3/uL (ref 150.0–400.0)
RBC: 4.98 Mil/uL (ref 3.87–5.11)
RDW: 12.9 % (ref 11.5–15.5)
WBC: 6 10*3/uL (ref 4.0–10.5)

## 2017-03-12 LAB — LIPID PANEL
CHOL/HDL RATIO: 3
Cholesterol: 155 mg/dL (ref 0–200)
HDL: 45.8 mg/dL (ref >39.00)
LDL Cholesterol: 83 mg/dL (ref 0–99)
NONHDL: 109.59
Triglycerides: 133 mg/dL (ref 0.0–149.0)
VLDL: 26.6 mg/dL (ref 0.0–40.0)

## 2017-03-12 LAB — VITAMIN D 25 HYDROXY (VIT D DEFICIENCY, FRACTURES): VITD: 39.9 ng/mL (ref 30.00–100.00)

## 2017-03-12 LAB — COMPREHENSIVE METABOLIC PANEL
ALBUMIN: 4.2 g/dL (ref 3.5–5.2)
ALT: 15 U/L (ref 0–35)
AST: 17 U/L (ref 0–37)
Alkaline Phosphatase: 75 U/L (ref 39–117)
BILIRUBIN TOTAL: 0.7 mg/dL (ref 0.2–1.2)
BUN: 16 mg/dL (ref 6–23)
CALCIUM: 9.4 mg/dL (ref 8.4–10.5)
CO2: 30 mEq/L (ref 19–32)
Chloride: 102 mEq/L (ref 96–112)
Creatinine, Ser: 0.81 mg/dL (ref 0.40–1.20)
GFR: 72.12 mL/min (ref >60.00)
GLUCOSE: 113 mg/dL — AB (ref 70–99)
Potassium: 3.9 mEq/L (ref 3.5–5.1)
Sodium: 140 mEq/L (ref 135–145)
Total Protein: 6.6 g/dL (ref 6.0–8.3)

## 2017-03-12 LAB — HEMOGLOBIN A1C: HEMOGLOBIN A1C: 6.4 % (ref 4.6–6.5)

## 2017-03-19 ENCOUNTER — Telehealth: Payer: Self-pay | Admitting: Family Medicine

## 2017-03-19 ENCOUNTER — Encounter: Payer: Self-pay | Admitting: Family Medicine

## 2017-03-19 NOTE — Telephone Encounter (Signed)
Copied from CRM [phone]. Topic: Appointment Scheduling - Scheduling Inquiry for Clinic >> Mar 19, 2017 10:56 AM Guinevere FerrariMorris, [place]am[place] E, NT wrote: Reason for CRM: Pt called in about wanting to speak to someone about about insurance for scheduling a physical and if would be too early for insurance to pay for it. Pt. Would like a call back. Pt is on the schedule now.

## 2017-03-22 NOTE — Telephone Encounter (Signed)
Left a VM for patient to give the office a call back regarding scheduling a yearly appointment.

## 2017-03-24 NOTE — Telephone Encounter (Signed)
Patient has been scheduled to come in for yearly. Nothing further needed

## 2017-04-07 ENCOUNTER — Other Ambulatory Visit: Payer: Self-pay | Admitting: Family Medicine

## 2017-04-07 ENCOUNTER — Encounter: Payer: Medicare Other | Admitting: Family Medicine

## 2017-04-07 DIAGNOSIS — Z1231 Encounter for screening mammogram for malignant neoplasm of breast: Secondary | ICD-10-CM

## 2017-04-22 ENCOUNTER — Encounter: Payer: Medicare Other | Admitting: Family Medicine

## 2017-04-23 ENCOUNTER — Encounter: Payer: Medicare Other | Admitting: Family Medicine

## 2017-04-29 ENCOUNTER — Other Ambulatory Visit: Payer: Self-pay | Admitting: Family Medicine

## 2017-05-06 ENCOUNTER — Encounter: Payer: Self-pay | Admitting: Family Medicine

## 2017-05-06 ENCOUNTER — Ambulatory Visit: Payer: Medicare Other | Admitting: Family Medicine

## 2017-05-06 VITALS — BP 120/70 | HR 82 | Temp 97.6°F | Ht 61.75 in | Wt 153.0 lb

## 2017-05-06 DIAGNOSIS — Z0001 Encounter for general adult medical examination with abnormal findings: Secondary | ICD-10-CM

## 2017-05-06 DIAGNOSIS — H6121 Impacted cerumen, right ear: Secondary | ICD-10-CM

## 2017-05-06 DIAGNOSIS — Z0000 Encounter for general adult medical examination without abnormal findings: Secondary | ICD-10-CM

## 2017-05-06 NOTE — Patient Instructions (Addendum)
Your vitamin D level was 39.9 the last time we checked in November.   Earwax Buildup, Adult The ears produce a substance called earwax that helps keep bacteria out of the ear and protects the skin in the ear canal. Occasionally, earwax can build up in the ear and cause discomfort or hearing loss. What increases the risk? This condition is more likely to develop in people who:  Are female.  Are elderly.  Naturally produce more earwax.  Clean their ears often with cotton swabs.  Use earplugs often.  Use in-ear headphones often.  Wear hearing aids.  Have narrow ear canals.  Have earwax that is overly thick or sticky.  Have eczema.  Are dehydrated.  Have excess hair in the ear canal.  What are the signs or symptoms? Symptoms of this condition include:  Reduced or muffled hearing.  A feeling of fullness in the ear or feeling that the ear is plugged.  Fluid coming from the ear.  Ear pain.  Ear itch.  Ringing in the ear.  Coughing.  An obvious piece of earwax that can be seen inside the ear canal.  How is this diagnosed? This condition may be diagnosed based on:  Your symptoms.  Your medical history.  An ear exam. During the exam, your health care provider will look into your ear with an instrument called an otoscope.  You may have tests, including a hearing test. How is this treated? This condition may be treated by:  Using ear drops to soften the earwax.  Having the earwax removed by a health care provider. The health care provider may: ? Flush the ear with water. ? Use an instrument that has a loop on the end (curette). ? Use a suction device.  Surgery to remove the wax buildup. This may be done in severe cases.  Follow these instructions at home:  Take over-the-counter and prescription medicines only as told by your health care provider.  Do not put any objects, including cotton swabs, into your ear. You can clean the opening of your ear canal  with a washcloth or facial tissue.  Follow instructions from your health care provider about cleaning your ears. Do not over-clean your ears.  Drink enough fluid to keep your urine clear or pale yellow. This will help to thin the earwax.  Keep all follow-up visits as told by your health care provider. If earwax builds up in your ears often or if you use hearing aids, consider seeing your health care provider for routine, preventive ear cleanings. Ask your health care provider how often you should schedule your cleanings.  If you have hearing aids, clean them according to instructions from the manufacturer and your health care provider. Contact a health care provider if:  You have ear pain.  You develop a fever.  You have blood, pus, or other fluid coming from your ear.  You have hearing loss.  You have ringing in your ears that does not go away.  Your symptoms do not improve with treatment.  You feel like the room is spinning (vertigo). Summary  Earwax can build up in the ear and cause discomfort or hearing loss.  The most common symptoms of this condition include reduced or muffled hearing and a feeling of fullness in the ear or feeling that the ear is plugged.  This condition may be diagnosed based on your symptoms, your medical history, and an ear exam.  This condition may be treated by using ear drops to soften  the earwax or by having the earwax removed by a health care provider.  Do not put any objects, including cotton swabs, into your ear. You can clean the opening of your ear canal with a washcloth or facial tissue. This information is not intended to replace advice given to you by your health care provider. Make sure you discuss any questions you have with your health care provider. Document Released: 05/28/2004 Document Revised: 07/01/2016 Document Reviewed: 07/01/2016 Elsevier Interactive Patient Education  2018 [place] Maintenance, Female Adopting a  healthy lifestyle and getting preventive care can go a long way to promote health and wellness. Talk with your health care provider about what schedule of regular examinations is right for you. This is a good chance for you to check in with your provider about disease prevention and staying healthy. In between checkups, there are plenty of things you can do on your own. Experts have done a lot of research about which lifestyle changes and preventive measures are most likely to keep you healthy. Ask your health care provider for more information. Weight and diet Eat a healthy diet  Be sure to include plenty of vegetables, fruits, low-fat dairy products, and lean protein.  Do not eat a lot of foods high in solid fats, added sugars, or salt.  Get regular exercise. This is one of the most important things you can do for your health. ? Most adults should exercise for at least 150 minutes each week. The exercise should increase your heart rate and make you sweat (moderate-intensity exercise). ? Most adults should also do strengthening exercises at least twice a week. This is in addition to the moderate-intensity exercise.  Maintain a healthy weight  Body mass index (BMI) is a measurement that can be used to identify possible weight problems. It estimates body fat based on height and weight. Your health care provider can help determine your BMI and help you achieve or maintain a healthy weight.  For females 56 years of age and older: ? A BMI below 18.5 is considered underweight. ? A BMI of 18.5 to 24.9 is normal. ? A BMI of 25 to 29.9 is considered overweight. ? A BMI of 30 and above is considered obese.  Watch levels of cholesterol and blood lipids  You should start having your blood tested for lipids and cholesterol at 82 years of age, then have this test every 5 years.  You may need to have your cholesterol levels checked more often if: ? Your lipid or cholesterol levels are high. ? You are  older than 82 years of age. ? You are at high risk for heart disease.  Cancer screening Lung Cancer  Lung cancer screening is recommended for adults 33-70 years old who are at high risk for lung cancer because of a history of smoking.  A yearly low-dose CT scan of the lungs is recommended for people who: ? Currently smoke. ? Have quit within the past 15 years. ? Have at least a 30-pack-year history of smoking. A pack year is smoking an average of one pack of cigarettes a day for 1 year.  Yearly screening should continue until it has been 15 years since you quit.  Yearly screening should stop if you develop a health problem that would prevent you from having lung cancer treatment.  Breast Cancer  Practice breast self-awareness. This means understanding how your breasts normally appear and feel.  It also means doing regular breast self-exams. Let your health care provider  know about any changes, no matter how small.  If you are in your 20s or 30s, you should have a clinical breast exam (CBE) by a health care provider every 1-3 years as part of a regular health exam.  If you are 24 or older, have a CBE every year. Also consider having a breast X-ray (mammogram) every year.  If you have a family history of breast cancer, talk to your health care provider about genetic screening.  If you are at high risk for breast cancer, talk to your health care provider about having an MRI and a mammogram every year.  Breast cancer gene (BRCA) assessment is recommended for women who have family members with BRCA-related cancers. BRCA-related cancers include: ? Breast. ? Ovarian. ? Tubal. ? Peritoneal cancers.  Results of the assessment will determine the need for genetic counseling and BRCA1 and BRCA2 testing.  Cervical Cancer Your health care provider may recommend that you be screened regularly for cancer of the pelvic organs (ovaries, uterus, and vagina). This screening involves a pelvic  examination, including checking for microscopic changes to the surface of your cervix (Pap test). You may be encouraged to have this screening done every 3 years, beginning at age 10.  For women ages 23-65, health care providers may recommend pelvic exams and Pap testing every 3 years, or they may recommend the Pap and pelvic exam, combined with testing for human papilloma virus (HPV), every 5 years. Some types of HPV increase your risk of cervical cancer. Testing for HPV may also be done on women of any age with unclear Pap test results.  Other health care providers may not recommend any screening for nonpregnant women who are considered low risk for pelvic cancer and who do not have symptoms. Ask your health care provider if a screening pelvic exam is right for you.  If you have had past treatment for cervical cancer or a condition that could lead to cancer, you need Pap tests and screening for cancer for at least 20 years after your treatment. If Pap tests have been discontinued, your risk factors (such as having a new sexual partner) need to be reassessed to determine if screening should resume. Some women have medical problems that increase the chance of getting cervical cancer. In these cases, your health care provider may recommend more frequent screening and Pap tests.  Colorectal Cancer  This type of cancer can be detected and often prevented.  Routine colorectal cancer screening usually begins at 82 years of age and continues through 82 years of age.  Your health care provider may recommend screening at an earlier age if you have risk factors for colon cancer.  Your health care provider may also recommend using home test kits to check for hidden blood in the stool.  A small camera at the end of a tube can be used to examine your colon directly (sigmoidoscopy or colonoscopy). This is done to check for the earliest forms of colorectal cancer.  Routine screening usually begins at age  11.  Direct examination of the colon should be repeated every 5-10 years through 82 years of age. However, you may need to be screened more often if early forms of precancerous polyps or small growths are found.  Skin Cancer  Check your skin from head to toe regularly.  Tell your health care provider about any new moles or changes in moles, especially if there is a change in a mole's shape or color.  Also tell your health  care provider if you have a mole that is larger than the size of a pencil eraser.  Always use sunscreen. Apply sunscreen liberally and repeatedly throughout the day.  Protect yourself by wearing long sleeves, pants, a wide-brimmed hat, and sunglasses whenever you are outside.  Heart disease, diabetes, and high blood pressure  High blood pressure causes heart disease and increases the risk of stroke. High blood pressure is more likely to develop in: ? People who have blood pressure in the high end of the normal range (130-139/85-89 mm Hg). ? People who are overweight or obese. ? People who are African American.  If you are 95-51 years of age, have your blood pressure checked every 3-5 years. If you are 2 years of age or older, have your blood pressure checked every year. You should have your blood pressure measured twice-once when you are at a hospital or clinic, and once when you are not at a hospital or clinic. Record the average of the two measurements. To check your blood pressure when you are not at a hospital or clinic, you can use: ? An automated blood pressure machine at a pharmacy. ? A home blood pressure monitor.  If you are between 32 years and 47 years old, ask your health care provider if you should take aspirin to prevent strokes.  Have regular diabetes screenings. This involves taking a blood sample to check your fasting blood sugar level. ? If you are at a normal weight and have a low risk for diabetes, have this test once every three years after 82  years of age. ? If you are overweight and have a high risk for diabetes, consider being tested at a younger age or more often. Preventing infection Hepatitis B  If you have a higher risk for hepatitis B, you should be screened for this virus. You are considered at high risk for hepatitis B if: ? You were born in a country where hepatitis B is common. Ask your health care provider which countries are considered high risk. ? Your parents were born in a high-risk country, and you have not been immunized against hepatitis B (hepatitis B vaccine). ? You have HIV or AIDS. ? You use needles to inject street drugs. ? You live with someone who has hepatitis B. ? You have had sex with someone who has hepatitis B. ? You get hemodialysis treatment. ? You take certain medicines for conditions, including cancer, organ transplantation, and autoimmune conditions.  Hepatitis C  Blood testing is recommended for: ? Everyone born from [birth year] through 1963. ? Anyone with known risk factors for hepatitis C.  Sexually transmitted infections (STIs)  You should be screened for sexually transmitted infections (STIs) including gonorrhea and chlamydia if: ? You are sexually active and are younger than 82 years of age. ? You are older than 82 years of age and your health care provider tells you that you are at risk for this type of infection. ? Your sexual activity has changed since you were last screened and you are at an increased risk for chlamydia or gonorrhea. Ask your health care provider if you are at risk.  If you do not have HIV, but are at risk, it may be recommended that you take a prescription medicine daily to prevent HIV infection. This is called pre-exposure prophylaxis (PrEP). You are considered at risk if: ? You are sexually active and do not regularly use condoms or know the HIV status of your partner(s). ? You take  drugs by injection. ? You are sexually active with a partner who has HIV.  Talk  with your health care provider about whether you are at high risk of being infected with HIV. If you choose to begin PrEP, you should first be tested for HIV. You should then be tested every 3 months for as long as you are taking PrEP. Pregnancy  If you are premenopausal and you may become pregnant, ask your health care provider about preconception counseling.  If you may become pregnant, take 400 to 800 micrograms (mcg) of folic acid every day.  If you want to prevent pregnancy, talk to your health care provider about birth control (contraception). Osteoporosis and menopause  Osteoporosis is a disease in which the bones lose minerals and strength with aging. This can result in serious bone fractures. Your risk for osteoporosis can be identified using a bone density scan.  If you are 69 years of age or older, or if you are at risk for osteoporosis and fractures, ask your health care provider if you should be screened.  Ask your health care provider whether you should take a calcium or vitamin D supplement to lower your risk for osteoporosis.  Menopause may have certain physical symptoms and risks.  Hormone replacement therapy may reduce some of these symptoms and risks. Talk to your health care provider about whether hormone replacement therapy is right for you. Follow these instructions at home:  Schedule regular health, dental, and eye exams.  Stay current with your immunizations.  Do not use any tobacco products including cigarettes, chewing tobacco, or electronic cigarettes.  If you are pregnant, do not drink alcohol.  If you are breastfeeding, limit how much and how often you drink alcohol.  Limit alcohol intake to no more than 1 drink per day for nonpregnant women. One drink equals 12 ounces of beer, 5 ounces of wine, or 1 ounces of hard liquor.  Do not use street drugs.  Do not share needles.  Ask your health care provider for help if you need support or information  about quitting drugs.  Tell your health care provider if you often feel depressed.  Tell your health care provider if you have ever been abused or do not feel safe at home. This information is not intended to replace advice given to you by your health care provider. Make sure you discuss any questions you have with your health care provider. Document Released: 11/03/2010 Document Revised: 09/26/2015 Document Reviewed: 01/22/2015 Elsevier Interactive Patient Education  2018 [place] 65 Years and Older, Female Preventive care refers to lifestyle choices and visits with your health care provider that can promote health and wellness. What does preventive care include?  A yearly physical exam. This is also called an annual well check.  Dental exams once or twice a year.  Routine eye exams. Ask your health care provider how often you should have your eyes checked.  Personal lifestyle choices, including: ? Daily care of your teeth and gums. ? Regular physical activity. ? Eating a healthy diet. ? Avoiding tobacco and drug use. ? Limiting alcohol use. ? Practicing safe sex. ? Taking low-dose aspirin every day. ? Taking vitamin and mineral supplements as recommended by your health care provider. What happens during an annual well check? The services and screenings done by your health care provider during your annual well check will depend on your age, overall health, lifestyle risk factors, and family history of disease. Counseling Your health care  provider may ask you questions about your:  Alcohol use.  Tobacco use.  Drug use.  Emotional well-being.  Home and relationship well-being.  Sexual activity.  Eating habits.  History of falls.  Memory and ability to understand (cognition).  Work and work Statistician.  Reproductive health.  Screening You may have the following tests or measurements:  Height, weight, and BMI.  Blood pressure.  Lipid  and cholesterol levels. These may be checked every 5 years, or more frequently if you are over 35 years old.  Skin check.  Lung cancer screening. You may have this screening every year starting at age 3 if you have a 30-pack-year history of smoking and currently smoke or have quit within the past 15 years.  Fecal occult blood test (FOBT) of the stool. You may have this test every year starting at age 9.  Flexible sigmoidoscopy or colonoscopy. You may have a sigmoidoscopy every 5 years or a colonoscopy every 10 years starting at age 67.  Hepatitis C blood test.  Hepatitis B blood test.  Sexually transmitted disease (STD) testing.  Diabetes screening. This is done by checking your blood sugar (glucose) after you have not eaten for a while (fasting). You may have this done every 1-3 years.  Bone density scan. This is done to screen for osteoporosis. You may have this done starting at age 48.  Mammogram. This may be done every 1-2 years. Talk to your health care provider about how often you should have regular mammograms.  Talk with your health care provider about your test results, treatment options, and if necessary, the need for more tests. Vaccines Your health care provider may recommend certain vaccines, such as:  Influenza vaccine. This is recommended every year.  Tetanus, diphtheria, and acellular pertussis (Tdap, Td) vaccine. You may need a Td booster every 10 years.  Varicella vaccine. You may need this if you have not been vaccinated.  Zoster vaccine. You may need this after age 70.  Measles, mumps, and rubella (MMR) vaccine. You may need at least one dose of MMR if you were born in 1957 or later. You may also need a second dose.  Pneumococcal 13-valent conjugate (PCV13) vaccine. One dose is recommended after age 44.  Pneumococcal polysaccharide (PPSV23) vaccine. One dose is recommended after age 73.  Meningococcal vaccine. You may need this if you have certain  conditions.  Hepatitis A vaccine. You may need this if you have certain conditions or if you travel or work in places where you may be exposed to hepatitis A.  Hepatitis B vaccine. You may need this if you have certain conditions or if you travel or work in places where you may be exposed to hepatitis B.  Haemophilus influenzae type b (Hib) vaccine. You may need this if you have certain conditions.  Talk to your health care provider about which screenings and vaccines you need and how often you need them. This information is not intended to replace advice given to you by your health care provider. Make sure you discuss any questions you have with your health care provider. Document Released: 05/17/2015 Document Revised: 01/08/2016 Document Reviewed: 02/19/2015 Elsevier Interactive Patient Education  [employer].

## 2017-05-06 NOTE — Progress Notes (Signed)
Patient presents to clinic today fpr CPE.  SUBJECTIVE:  Pt states she has been doing well.  She wants to schedule a mammogram in the next few wks.  Pt endorses R ear pain after having a cavity filled by her dentist.  Pt also mentions a h/o ear wax build up in the R ear.   Pt had recent labs 03/12/17 including lipid panel, A1c 6.4, random glucose 113, BMP.  Patient has a history of a tortuous colon making colonoscopy difficult.  She had cold guard done January 2018, results were positive.  Pt was seen by GI on 06/08/16, at which time virtual colonoscopy was recommended.   Past Medical History:  Diagnosis Date  . Allergic rhinitis   . Atypical chest pain 09/10/2015  . Bronchitis, acute   . Dyspnea on exertion   . Exudative pleural effusion 5/09    sterile, benign, inflammatory  . GERD (gastroesophageal reflux disease) 09/10/2015  . Systolic murmur 09/10/2015    Past Surgical History:  Procedure Laterality Date  . APPENDECTOMY    . OOPHORECTOMY Right   . thoracentesis left  5/09    Current Outpatient Medications on File Prior to Visit  Medication Sig Dispense Refill  . aspirin (ADULT ASPIRIN EC LOW STRENGTH) 81 MG EC tablet Take 81 mg by mouth daily.      . Azelastine-Fluticasone (DYMISTA) 137-50 MCG/ACT SUSP Place 1 spray into the nose at bedtime and may repeat dose one time if needed. 1 Bottle 0  . co-enzyme Q-10 30 MG capsule Take 30 mg by mouth daily.      Marland Kitchen DIGESTIVE ENZYMES PO Take 1 capsule by mouth daily.    Marland Kitchen ezetimibe (ZETIA) 10 MG tablet Take 10 mg by mouth daily.    . Glucosamine-Chondroit-Vit C-Mn (GLUCOSAMINE 1500 COMPLEX PO) Take by mouth daily.      . mometasone (NASONEX) 50 MCG/ACT nasal spray Place into the nose at bedtime. 1-2 sprays each nostril     . Multiple Minerals-Vitamins (CITRACAL PLUS) TABS Take 1 tablet by mouth daily.     . Multiple Vitamins-Minerals (MULTIVITAL) tablet Take 1 tablet by mouth daily.      . Omega-3 Fatty Acids (FISH OIL) 1000 MG CAPS Take  by mouth daily.      . Probiotic Product (PROBIOTIC DAILY PO) Take 1 capsule by mouth daily.    . ranitidine (ZANTAC) 150 MG tablet Take 1 tablet (150 mg total) by mouth 2 (two) times daily. 60 tablet 6  . rosuvastatin (CRESTOR) 10 MG tablet Take 5 mg by mouth daily.      . vitamin B-12 (CYANOCOBALAMIN) 1000 MCG tablet Take 1,000 mcg by mouth daily.       No current facility-administered medications on file prior to visit.     Allergies  Allergen Reactions  . Erythromycin     Family History  Problem Relation Age of Onset  . Allergies Mother   . Heart disease Mother 52  . Cancer Mother   . Heart disease Father 40  . Thyroid cancer Sister     Social History   Socioeconomic History  . Marital status: Single    Spouse name: Not on file  . Number of children: Not on file  . Years of education: Not on file  . Highest education level: Not on file  Social Needs  . Financial resource strain: Not on file  . Food insecurity - worry: Not on file  . Food insecurity - inability: Not on file  .  Transportation needs - medical: Not on file  . Transportation needs - non-medical: Not on file  Occupational History  . Not on file  Tobacco Use  . Smoking status: Never Smoker  . Smokeless tobacco: Never Used  Substance and Sexual Activity  . Alcohol use: No  . Drug use: No  . Sexual activity: Not on file  Other Topics Concern  . 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     ROS General: Denies fever, chills, night sweats, changes in weight, changes in appetite HEENT: Denies headaches, changes in vision, rhinorrhea, sore throat   +R ear pain CV: Denies CP, palpitations, SOB, orthopnea Pulm: Denies SOB, cough, wheezing GI: Denies abdominal pain, nausea, vomiting, diarrhea, constipation GU: Denies dysuria, hematuria, frequency, vaginal discharge Msk: Denies muscle cramps, joint pains Neuro: Denies weakness, numbness, tingling Skin: Denies rashes,  bruising Psych: Denies depression, anxiety, hallucinations  BP 120/70 (BP Location: Right Arm, Patient Position: Sitting, Cuff Size: Large)   Pulse 82   Temp 97.6 F (36.4 C) (Oral)   Ht 5' 1.75" (1.568 m)   Wt 153 lb (69.4 kg)   BMI 28.21 kg/m   Physical Exam Gen. Pleasant, well developed, well-nourished, in NAD HEENT - [place]/AT, PERRL, no scleral icterus, no nasal drainage, pharynx without erythema or exudate.  Left TM and canal normal.  Right TM occluded with hard cerumen.  After irrigation right TM full in appearance. Lungs: no use of accessory muscles, CTAB, no wheezes, rales or rhonchi Cardiovascular: RRR, No r/g/m, no peripheral edema Abdomen: BS present, soft, nontender,nondistended, no hepatosplenomegaly Musculoskeletal: No deformities, moves all four extremities, no cyanosis or clubbing, normal tone Neuro:  A&Ox3, CN II-XII intact, normal gait Skin:  Warm, dry, intact, no lesions Psych: normal affect, mood appropriate, pt at times repeats herself Breast: large pendulous, no deformities,no nipple d/c, no skin changes or nipple retraction.  No masses appreciated.  Recent Results (from the past 2160 hour(s))  Lipid panel     Status: None   Collection Time: 03/12/17 10:07 AM  Result Value Ref Range   Cholesterol 155 0 - 200 mg/dL    Comment: ATP III Classification       Desirable:  < 200 mg/dL               Borderline High:  200 - 239 mg/dL          High:  > = 161240 mg/dL   Triglycerides 096.0133.0 0.0 - 149.0 mg/dL    Comment: Normal:  <454<150 mg/dLBorderline High:  150 - 199 mg/dL   HDL 09.8145.80 >19.14>39.00 mg/dL   VLDL 78.226.6 0.0 - 95.640.0 mg/dL   LDL Cholesterol 83 0 - 99 mg/dL   Total CHOL/HDL Ratio 3     Comment:                Men          Women1/2 Average Risk     3.4          3.3Average Risk          5.0          4.42X Average Risk          9.6          7.13X Average Risk          15.0          11.0  NonHDL 109.59     Comment: NOTE:  Non-HDL goal should be 30 mg/dL  higher than patient's LDL goal (i.e. LDL goal of < 70 mg/dL, would have non-HDL goal of < 100 mg/dL)  CBC with Differential/Platelet     Status: None   Collection Time: 03/12/17 10:07 AM  Result Value Ref Range   WBC 6.0 4.0 - 10.5 K/uL   RBC 4.98 3.87 - 5.11 Mil/uL   Hemoglobin 15.0 12.0 - 15.0 g/dL   HCT 09.8 11.9 - 14.7 %   MCV 89.0 78.0 - 100.0 fl   MCHC 33.8 30.0 - 36.0 g/dL   RDW 82.9 56.2 - 13.0 %   Platelets 284.0 150.0 - 400.0 K/uL   Neutrophils Relative % 65.1 43.0 - 77.0 %   Lymphocytes Relative 23.8 12.0 - 46.0 %   Monocytes Relative 7.5 3.0 - 12.0 %   Eosinophils Relative 3.1 0.0 - 5.0 %   Basophils Relative 0.5 0.0 - 3.0 %   Neutro Abs 3.9 1.4 - 7.7 K/uL   Lymphs Abs 1.4 0.7 - 4.0 K/uL   Monocytes Absolute 0.5 0.1 - 1.0 K/uL   Eosinophils Absolute 0.2 0.0 - 0.7 K/uL   Basophils Absolute 0.0 0.0 - 0.1 K/uL  Comprehensive metabolic panel     Status: Abnormal   Collection Time: 03/12/17 10:07 AM  Result Value Ref Range   Sodium 140 135 - 145 mEq/L   Potassium 3.9 3.5 - 5.1 mEq/L   Chloride 102 96 - 112 mEq/L   CO2 30 19 - 32 mEq/L   Glucose, Bld 113 (H) 70 - 99 mg/dL   BUN 16 6 - 23 mg/dL   Creatinine, Ser 8.65 0.40 - 1.20 mg/dL   Total Bilirubin 0.7 0.2 - 1.2 mg/dL   Alkaline Phosphatase 75 39 - 117 U/L   AST 17 0 - 37 U/L   ALT 15 0 - 35 U/L   Total Protein 6.6 6.0 - 8.3 g/dL   Albumin 4.2 3.5 - 5.2 g/dL   Calcium 9.4 8.4 - 78.4 mg/dL   GFR 69.62 >95.28 mL/min  Hemoglobin A1c     Status: None   Collection Time: 03/12/17 10:07 AM  Result Value Ref Range   Hgb A1c MFr Bld 6.4 4.6 - 6.5 %    Comment: Glycemic Control Guidelines for People with Diabetes:Non Diabetic:  <6%Goal of Therapy: <7%Additional Action Suggested:  >8%   Vitamin D, 25-hydroxy     Status: None   Collection Time: 03/12/17 10:07 AM  Result Value Ref Range   VITD 39.90 30.00 - 100.00 ng/mL    Assessment/Plan: Physical exam, annual -Discussed health maintenance.  Patient to schedule  mammogram in the next few months.  Patient wishes to call her insurance company to see if they still cover Pap smear.  If they do we will schedule Pap smear in the next few weeks.  Discussed with patient at her age this is optional.  Discussed continued use of seatbelts, smoke detectors in the home, increasing physical activity/weightbearing exercise.  Increasing p.o. intake of dairy. -Will wait on labs, as just done 03/2017.  Impacted cerumen of right ear -Ear irrigated.  R TM appeared full. -Ok to take Tylenol prn for pain/discomfort.  Also discussed use of allergy pill or nasal spray to help with increased pressure. -Discussed increase p.o. intake of water -Can use Debrox as needed    F/u in the next 6 months.  Sooner if needed.   Abbe Amsterdam, MD

## 2017-05-12 NOTE — Telephone Encounter (Signed)
Copied from CRM [id number]. Topic: General - Other >> May 12, 2017  4:44 PM Raquel SarnaHayes, Teresa G wrote: Please call pt.  Pt wants all her Rx's in written form to take to the pharmacy when she is needing them.  Pt also wants a print out of all her recent of all lab results.

## 2017-05-13 ENCOUNTER — Telehealth: Payer: Self-pay | Admitting: Emergency Medicine

## 2017-05-13 ENCOUNTER — Telehealth: Payer: Self-pay | Admitting: Family Medicine

## 2017-05-13 NOTE — Telephone Encounter (Signed)
Spoke with patient regarding prescription and lab results. Explained to patient that both documents will be ready waiting up front for pick up. Nothing further needed.

## 2017-05-13 NOTE — Telephone Encounter (Signed)
Fine to fill all 3.

## 2017-05-13 NOTE — Telephone Encounter (Signed)
Looking at the patient chart last refill for omeprazole was 03/23/2011, I did not see Flonase on patient chart, and it looks like patient just needs a refill for Crestor. Patient would like a written prescription for all three of these medication. Please advise if okay to print out. Thank you.

## 2017-05-13 NOTE — Telephone Encounter (Signed)
Patient needs Rx printed for:  ezetimibe (ZETIA) 10 MG tablet   She doesn't want the Rx sent to a pharmacy.

## 2017-05-14 ENCOUNTER — Other Ambulatory Visit: Payer: Self-pay | Admitting: Emergency Medicine

## 2017-05-14 MED ORDER — EZETIMIBE 10 MG PO TABS: 10.0000 mg | ORAL_TABLET | Freq: Every day | ORAL | 1 refills | Status: DC

## 2017-05-14 NOTE — Telephone Encounter (Signed)
Spoke with patient regarding prescription being ready for pick. Nothing further needed.

## 2017-09-11 ENCOUNTER — Emergency Department (HOSPITAL_COMMUNITY): Payer: Medicare Other

## 2017-09-11 ENCOUNTER — Encounter (HOSPITAL_COMMUNITY): Payer: Self-pay

## 2017-09-11 ENCOUNTER — Observation Stay (HOSPITAL_COMMUNITY)
Admission: EM | Admit: 2017-09-11 | Discharge: 2017-09-12 | Disposition: A | Discharge status: Home / Self Care | Payer: Medicare Other | Attending: Internal Medicine | Admitting: Internal Medicine

## 2017-09-11 ENCOUNTER — Other Ambulatory Visit: Payer: Self-pay

## 2017-09-11 DIAGNOSIS — I10 Essential (primary) hypertension: Secondary | ICD-10-CM | POA: Diagnosis not present

## 2017-09-11 DIAGNOSIS — R0789 Other chest pain: Principal | ICD-10-CM | POA: Insufficient documentation

## 2017-09-11 DIAGNOSIS — M79602 Pain in left arm: Secondary | ICD-10-CM | POA: Insufficient documentation

## 2017-09-11 DIAGNOSIS — J3089 Other allergic rhinitis: Secondary | ICD-10-CM | POA: Insufficient documentation

## 2017-09-11 DIAGNOSIS — Z8249 Family history of ischemic heart disease and other diseases of the circulatory system: Secondary | ICD-10-CM | POA: Diagnosis not present

## 2017-09-11 DIAGNOSIS — R61 Generalized hyperhidrosis: Secondary | ICD-10-CM | POA: Insufficient documentation

## 2017-09-11 DIAGNOSIS — R079 Chest pain, unspecified: Secondary | ICD-10-CM

## 2017-09-11 DIAGNOSIS — R011 Cardiac murmur, unspecified: Secondary | ICD-10-CM | POA: Insufficient documentation

## 2017-09-11 DIAGNOSIS — J302 Other seasonal allergic rhinitis: Secondary | ICD-10-CM | POA: Diagnosis present

## 2017-09-11 DIAGNOSIS — Z79899 Other long term (current) drug therapy: Secondary | ICD-10-CM | POA: Diagnosis not present

## 2017-09-11 DIAGNOSIS — K219 Gastro-esophageal reflux disease without esophagitis: Secondary | ICD-10-CM | POA: Insufficient documentation

## 2017-09-11 DIAGNOSIS — M542 Cervicalgia: Secondary | ICD-10-CM | POA: Diagnosis not present

## 2017-09-11 DIAGNOSIS — R110 Nausea: Secondary | ICD-10-CM | POA: Diagnosis not present

## 2017-09-11 DIAGNOSIS — Z881 Allergy status to other antibiotic agents status: Secondary | ICD-10-CM | POA: Insufficient documentation

## 2017-09-11 DIAGNOSIS — E7800 Pure hypercholesterolemia, unspecified: Secondary | ICD-10-CM | POA: Insufficient documentation

## 2017-09-11 DIAGNOSIS — E785 Hyperlipidemia, unspecified: Secondary | ICD-10-CM | POA: Insufficient documentation

## 2017-09-11 DIAGNOSIS — E782 Mixed hyperlipidemia: Secondary | ICD-10-CM

## 2017-09-11 LAB — CBC
HCT: 44.1 % (ref 36.0–46.0)
Hemoglobin: 15 g/dL (ref 12.0–15.0)
MCH: 30 pg (ref 26.0–34.0)
MCHC: 34 g/dL (ref 30.0–36.0)
MCV: 88.2 fL (ref 78.0–100.0)
PLATELETS: 262 10*3/uL (ref 150–400)
RBC: 5 MIL/uL (ref 3.87–5.11)
RDW: 12.5 % (ref 11.5–15.5)
WBC: 6.1 10*3/uL (ref 4.0–10.5)

## 2017-09-11 LAB — BASIC METABOLIC PANEL
Anion gap: 9 (ref 5–15)
BUN: 18 mg/dL (ref 6–20)
CALCIUM: 9.1 mg/dL (ref 8.9–10.3)
CO2: 26 mmol/L (ref 22–32)
CREATININE: 0.82 mg/dL (ref 0.44–1.00)
Chloride: 106 mmol/L (ref 101–111)
GFR calc non Af Amer: >60 mL/min (ref >60)
Glucose, Bld: 120 mg/dL — ABNORMAL HIGH (ref 65–99)
Potassium: 4 mmol/L (ref 3.5–5.1)
SODIUM: 141 mmol/L (ref 135–145)

## 2017-09-11 LAB — TROPONIN I

## 2017-09-11 LAB — I-STAT TROPONIN, ED: TROPONIN I, POC: 0.01 ng/mL (ref 0.00–0.08)

## 2017-09-11 MED ORDER — ROSUVASTATIN CALCIUM 10 MG PO TABS
10.0000 mg | ORAL_TABLET | Freq: Every day | ORAL | Status: DC
  Administered 2017-09-11 – 2017-09-12 (×2): 10 mg via ORAL
  Filled 2017-09-11 (×3): qty 1

## 2017-09-11 MED ORDER — MORPHINE SULFATE (PF) 4 MG/ML IV SOLN: 1.0000 mg | INTRAVENOUS | Status: DC | PRN

## 2017-09-11 MED ORDER — ALPRAZOLAM 0.25 MG PO TABS: 0.2500 mg | ORAL_TABLET | Freq: Two times a day (BID) | ORAL | Status: DC | PRN

## 2017-09-11 MED ORDER — AZELASTINE-FLUTICASONE 137-50 MCG/ACT NA SUSP: 1.0000 | Freq: Every evening | NASAL | Status: DC | PRN

## 2017-09-11 MED ORDER — SODIUM CHLORIDE 0.9 % IV SOLN
INTRAVENOUS | Status: DC
  Administered 2017-09-11: 17:00:00 via INTRAVENOUS

## 2017-09-11 MED ORDER — ONDANSETRON HCL 4 MG/2ML IJ SOLN: 4.0000 mg | Freq: Four times a day (QID) | INTRAMUSCULAR | Status: DC | PRN

## 2017-09-11 MED ORDER — GI COCKTAIL ~~LOC~~: 30.0000 mL | Freq: Four times a day (QID) | ORAL | Status: DC | PRN

## 2017-09-11 MED ORDER — ACETAMINOPHEN 325 MG PO TABS: 650.0000 mg | ORAL_TABLET | ORAL | Status: DC | PRN

## 2017-09-11 MED ORDER — COENZYME Q10 30 MG PO CAPS: 30.0000 mg | ORAL_CAPSULE | ORAL | Status: DC

## 2017-09-11 MED ORDER — HEPARIN SODIUM (PORCINE) 5000 UNIT/ML IJ SOLN
5000.0000 [IU] | Freq: Three times a day (TID) | INTRAMUSCULAR | Status: DC
  Administered 2017-09-11 – 2017-09-12 (×3): 5000 [IU] via SUBCUTANEOUS
  Filled 2017-09-11 (×3): qty 1

## 2017-09-11 MED ORDER — EZETIMIBE 10 MG PO TABS: 5.0000 mg | ORAL_TABLET | ORAL | Status: DC

## 2017-09-11 NOTE — ED Notes (Signed)
Informed RN of pt's bed status.

## 2017-09-11 NOTE — H&P (Signed)
History and Physical    Sara Charles WUJ:[id number] DOB: 04-03-1934 DOA: 09/11/2017   PCP: Deeann Saint, MD   Patient coming from:  Home    Chief Complaint: Left arm pain and back pain, nausea and diaphoresis   HPI: Sara Charles is a 84 y.o. female with medical history significant for HTN, HLD, GERD, history of murmur, brought to the emergency department due to sudden onset of aching pain in her back, radiating to the left elbow and neck, with diaphoresis and nausea.  The patient states that about 10 years ago, she had similar symptoms, requiring a stress test which was negative.  She had in the distant past, one episode of what it seemed chest pain in the substernal area, but not recently.  She denies any prior history of cardiac disease, or having had any cardiac catheterization.  On presentation, it was 10 out of 10, then receiving aspirin 324 mg and nitroglycerin x2, decreasing from 6 to 4 out of 10 after the nitroglycerin.  Denies any new episodes since admission.  Denies any dizziness or falls. No syncope or presyncope.   Denies any cough. Denies any fever or chills. Denies any nausea, vomiting or abdominal pain. Appetite is normal  Denies any leg swelling or calf pain. Denies any headaches or vision changes. Denies any seizures No confusion reported. No recent long distance trips. Denies any new stressors. No new meds. Not on hormonal therapy.  No new herbal supplements.  No alcohol, tobacco or recreational drugs. She has significant family history for heart disease and elevated cholesterol.  ED Course:  BP 118/78   Pulse 73   Temp 98.3 F (36.8 C) (Oral)   Resp 16   Ht  (1.575 m)   Wt 70.3 kg (155 lb)   SpO2 93%   BMI 28.35 kg/m   Troponin negative.  EKG   Normal sinus rhythm Low voltage QRS Cannot rule out Anterior infarct , age undetermined  Labs essentially unremarkable. Glucose 120  Review of Systems:  As per HPI otherwise all other systems reviewed and  are negative  Past Medical History:  Diagnosis Date  . Allergic rhinitis   . Atypical chest pain 09/10/2015  . Bronchitis, acute   . Dyspnea on exertion   . Exudative pleural effusion 5/09    sterile, benign, inflammatory  . GERD (gastroesophageal reflux disease) 09/10/2015  . Systolic murmur 09/10/2015    Past Surgical History:  Procedure Laterality Date  . APPENDECTOMY    . OOPHORECTOMY Right   . thoracentesis left  5/09    Social History Social History   Socioeconomic History  . Marital status: Single    Spouse name: Not on file  . Number of children: Not on file  . Years of education: Not on file  . Highest education level: Not on file  Occupational History  . Not on file  Social Needs  . Financial resource strain: Not on file  . Food insecurity:    Worry: Not on file    Inability: Not on file  . Transportation needs:    Medical: Not on file    Non-medical: Not on file  Tobacco Use  . Smoking status: Never Smoker  . Smokeless tobacco: Never Used  Substance and Sexual Activity  . Alcohol use: No  . Drug use: No  . Sexual activity: Not on file  Lifestyle  . Physical activity:    Days per week: Not on file    Minutes per  session: Not on file  . Stress: Not on file  Relationships  . Social connections:    Talks on phone: Not on file    Gets together: Not on file    Attends religious service: Not on file    Active member of club or organization: Not on file    Attends meetings of clubs or organizations: Not on file    Relationship status: Not on file  . Intimate partner violence:    Fear of current or ex partner: Not on file    Emotionally abused: Not on file    Physically abused: Not on file    Forced sexual activity: Not on file  Other Topics Concern  . 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      Allergies  Allergen Reactions  . Erythromycin Itching    SEVERE ITCHING  . Other Other (See Comments)    NO  "ACIDIC" FOODS!! They cause GI upset  . Tomato Other (See Comments)    Causes GI upset    Family History  Problem Relation Age of Onset  . Allergies Mother   . Heart disease Mother 70  . Cancer Mother   . Heart disease Father 96  . Thyroid cancer Sister       Prior to Admission medications   Medication Sig Start Date End Date Taking? Authorizing Provider  Calcium Carb-Cholecalciferol (CALCIUM + D3 PO) Take 1 tablet by mouth daily.   Yes [provider]  co-enzyme Q-10 30 MG capsule Take 30 mg by mouth 2 (two) times a week.    Yes [provider]  CRESTOR 10 MG tablet TAKE 1 TABLET EVERY DAY Patient taking differently: Take 5 mg by mouth two times a week 05/13/17  Yes Deeann Saint, MD  ezetimibe (ZETIA) 10 MG tablet Take 1 tablet (10 mg total) by mouth daily. Patient taking differently: Take 5 mg by mouth 2 (two) times a week.  05/14/17  Yes Deeann Saint, MD  Glucosamine-Chondroit-Vit C-Mn (GLUCOSAMINE 1500 COMPLEX PO) Take 1 tablet by mouth daily as needed (for joint pain).    Yes [provider]  Multiple Vitamins-Minerals (MULTIVITAMIN WOMEN 50+) TABS Take 1 tablet by mouth daily.   Yes [provider]  Omega-3 Fatty Acids (FISH OIL) 1000 MG CAPS Take 1 capsule by mouth daily.    Yes [provider]  Probiotic Product (PROBIOTIC DAILY PO) Take 1 capsule by mouth daily.   Yes [provider]  sodium chloride (OCEAN) 0.65 % SOLN nasal spray Place 1-2 sprays into both nostrils as needed for congestion.   Yes [provider]  Azelastine-Fluticasone (DYMISTA) 137-50 MCG/ACT SUSP Place 1 spray into the nose at bedtime and may repeat dose one time if needed. 07/15/12   Waymon Budge, MD  fluticasone (FLONASE) 50 MCG/ACT nasal spray USE DAILY AS DIRECTED Patient not taking: Reported on 09/11/2017 05/13/17   Deeann Saint, MD  omeprazole (PRILOSEC) 20 MG capsule TAKE ONE CAPSULE EVERY DAY Patient not taking: Reported on  09/11/2017 05/13/17   Deeann Saint, MD  ranitidine (ZANTAC) 150 MG tablet Take 1 tablet (150 mg total) by mouth 2 (two) times daily. Patient not taking: Reported on 09/11/2017 06/08/16   Esterwood, Amy S, PA-C  vitamin B-12 (CYANOCOBALAMIN) 1000 MCG tablet Take 1,000 mcg by mouth daily.      [provider]    Physical Exam:  Vitals:   09/11/17 1136 09/11/17  1138 09/11/17 1407 09/11/17 1530  BP:  109/74 (!) 129/59 118/78  Pulse:  80 68 73  Resp:  Temp:  98.3 F (36.8 C)    TempSrc:  Oral    SpO2:  95% 98% 93%  Weight: 70.3 kg (155 lb)     Height:  (1.575 m)      Constitutional: NAD, calm, comfortable  Eyes: PERRL, lids and conjunctivae normal ENMT: Mucous membranes are moist, without exudate or lesions  Neck: normal, supple, no masses, no thyromegaly Respiratory: clear to auscultation bilaterally, no wheezing, no crackles. Normal respiratory effort  Cardiovascular: Regular rate and rhythm, very soft murmur, with what appears to be a click, rubs or gallops. No extremity edema. 2+ pedal pulses. No carotid bruits.  Abdomen: Soft, non tender, No hepatosplenomegaly. Bowel sounds positive.  Musculoskeletal: no clubbing / cyanosis. Moves all extremities.  Cannot reproduce pain in her arm and back Skin: no jaundice, No lesions.  Neurologic: Sensation intact  Strength equal in all extremities Psychiatric:   Alert and oriented x 3. Normal mood.     Labs on Admission: I have personally reviewed following labs and imaging studies  CBC: Recent Labs  Lab 09/11/17 1146  WBC 6.1  HGB 15.0  HCT 44.1  MCV 88.2  PLT 262    Basic Metabolic Panel: Recent Labs  Lab 09/11/17 1146  NA 141  K 4.0  CL 106  CO2 26  GLUCOSE 120*  BUN 18  CREATININE 0.82  CALCIUM 9.1    GFR: Estimated Creatinine Clearance: 49.4 mL/min (by C-G formula based on SCr of 0.82 mg/dL).  Liver Function Tests: No results for input(s): AST, ALT, ALKPHOS, BILITOT, PROT, ALBUMIN in the  last 168 hours. No results for input(s): LIPASE, AMYLASE in the last 168 hours. No results for input(s): AMMONIA in the last 168 hours.  Coagulation Profile: No results for input(s): INR, PROTIME in the last 168 hours.  Cardiac Enzymes: No results for input(s): CKTOTAL, CKMB, CKMBINDEX, TROPONINI in the last 168 hours.  BNP (last 3 results) No results for input(s): PROBNP in the last 8760 hours.  HbA1C: No results for input(s): HGBA1C in the last 72 hours.  CBG: No results for input(s): GLUCAP in the last 168 hours.  Lipid Profile: No results for input(s): CHOL, HDL, LDLCALC, TRIG, CHOLHDL, LDLDIRECT in the last 72 hours.  Thyroid Function Tests: No results for input(s): TSH, T4TOTAL, FREET4, T3FREE, THYROIDAB in the last 72 hours.  Anemia Panel: No results for input(s): VITAMINB12, FOLATE, FERRITIN, TIBC, IRON, RETICCTPCT in the last 72 hours.  Urine analysis: No results found for: COLORURINE, APPEARANCEUR, LABSPEC, PHURINE, GLUCOSEU, HGBUR, BILIRUBINUR, KETONESUR, PROTEINUR, UROBILINOGEN, NITRITE, LEUKOCYTESUR  Sepsis Labs: (procalcitonin:4,lacticidven:4) )No results found for this or any previous visit (from the past 240 hour(s)).   Radiological Exams on Admission: Dg Chest 2 View  Result Date: 09/11/2017 CLINICAL DATA:  Pt is having chest pains on both sides with pain radiating to arms shoulders and back.all starting today. EXAM: CHEST - 2 VIEW COMPARISON:  11/11/2010 FINDINGS: The heart size and mediastinal contours are within normal limits. Both lungs are clear. The visualized skeletal structures are unremarkable. IMPRESSION: No active cardiopulmonary disease. Electronically Signed   By: Norva Pavlov M.D.   On: 09/11/2017 13:16    EKG: Independently reviewed.  Normal sinus rhythm Low voltage QRS Cannot rule out Anterior infarct , age undetermined Abnormal ECG No old tracing to compare Assessment/Plan Principal Problem:   Chest pain Active Problems:  Seasonal and perennial allergic rhinitis   Systolic murmur   GERD (gastroesophageal reflux disease)   Hyperlipidemia    Chest pain syndrome, cardiac versus musculoskeletal  HEART score 4-5. Troponin , EKG  Normal sinus rhythm Low voltage QRS Cannot rule out Anterior infarct , age undetermined . CP relieved by nitroglycerin  aspirin. CXR unrevealing.  Risk factors include HTN< HLD< fam history of heart disease,age, female. Chest pain free at this time   Admit to Telemetry/ Observation Chest pain order set Cycle troponins EKG q 6 x3 continue ASA, O2 and NTG as needed Continue preadmission beta blocker and nitrate Statins  GI cocktail Check Lipid panel  Hb A1C 2 D echo    Hypertension BP 118/78   Pulse 73    Continue home anti-hypertensive medications      Hyperlipidemia Continue home statins  GERD, no acute symptoms Continue PPI   DVT prophylaxis:  Heparin sq  Code Status:    FUll  Family Communication:  Discussed with patient Disposition Plan: Expect patient to be discharged to home after condition improves Consults called:    None  Admission status: Tele Obs    Marlowe Kays, PA-C [facility]   Amion text  [phone]   09/11/2017, 4:16 PM

## 2017-09-11 NOTE — ED Provider Notes (Addendum)
[facility] EMERGENCY DEPARTMENT Provider Note   CSN: [id number] Arrival date & time: 09/11/17  1112     History   Chief Complaint Chief Complaint  Patient presents with  . Shoulder Pain    HPI Sara Charles is a 84 y.o. female.  HPI Pt woke up this morning with an aching pain in her back that radiated down her left elbow and neck.  She felt diaphoretic and nauseated. She called EMS and was given NTG and ASA and her sx have now resolved.  The sx lasted for about 30 minutes.  PT does not have a history of heart disease. SHe has had stress tests previously.  She has high cholesterol, no htn, and no smoking.  FmHX of heart disease Past Medical History:  Diagnosis Date  . Allergic rhinitis   . Atypical chest pain 09/10/2015  . Bronchitis, acute   . Dyspnea on exertion   . Exudative pleural effusion 5/09    sterile, benign, inflammatory  . GERD (gastroesophageal reflux disease) 09/10/2015  . Systolic murmur 09/10/2015    Patient Active Problem List   Diagnosis Date Noted  . Systolic murmur 09/10/2015  . Atypical chest pain 09/10/2015  . GERD (gastroesophageal reflux disease) 09/10/2015  . PNEUMONIA 09/13/2007  . EFFUSION, PLEURAL 09/13/2007  . BRONCHITIS, ACUTE 09/12/2007  . Seasonal and perennial allergic rhinitis 04/20/2007  . DYSPNEA ON EXERTION 04/20/2007    Past Surgical History:  Procedure Laterality Date  . APPENDECTOMY    . OOPHORECTOMY Right   . thoracentesis left  5/09     OB History   None      Home Medications    Prior to Admission medications   Medication Sig Start Date End Date Taking? Authorizing Provider  co-enzyme Q-10 30 MG capsule Take 30 mg by mouth 2 (two) times a week.    Yes [provider]  Azelastine-Fluticasone (DYMISTA) 137-50 MCG/ACT SUSP Place 1 spray into the nose at bedtime and may repeat dose one time if needed. 07/15/12   Waymon Budge, MD  CRESTOR 10 MG tablet TAKE 1 TABLET EVERY DAY 05/13/17    Deeann Saint, MD  ezetimibe (ZETIA) 10 MG tablet Take 1 tablet (10 mg total) by mouth daily. 05/14/17   Deeann Saint, MD  fluticasone (FLONASE) 50 MCG/ACT nasal spray USE DAILY AS DIRECTED 05/13/17   Deeann Saint, MD  Glucosamine-Chondroit-Vit C-Mn (GLUCOSAMINE 1500 COMPLEX PO) Take by mouth daily.      [provider]  mometasone (NASONEX) 50 MCG/ACT nasal spray Place into the nose at bedtime. 1-2 sprays each nostril     [provider]  Multiple Minerals-Vitamins (CITRACAL PLUS) TABS Take 1 tablet by mouth daily.     [provider]  Multiple Vitamins-Minerals (MULTIVITAL) tablet Take 1 tablet by mouth daily.      [provider]  Omega-3 Fatty Acids (FISH OIL) 1000 MG CAPS Take by mouth daily.      [provider]  omeprazole (PRILOSEC) 20 MG capsule TAKE ONE CAPSULE EVERY DAY 05/13/17   Deeann Saint, MD  Probiotic Product (PROBIOTIC DAILY PO) Take 1 capsule by mouth daily.    [provider]  ranitidine (ZANTAC) 150 MG tablet Take 1 tablet (150 mg total) by mouth 2 (two) times daily. 06/08/16   Esterwood, Amy S, PA-C  vitamin B-12 (CYANOCOBALAMIN) 1000 MCG tablet Take 1,000 mcg by mouth daily.      [provider]    [facility]  History Family History  Problem Relation Age of Onset  . Allergies Mother   . Heart disease Mother 57  . Cancer Mother   . Heart disease Father 6  . Thyroid cancer Sister     Social History Social History   Tobacco Use  . Smoking status: Never Smoker  . Smokeless tobacco: Never Used  Substance Use Topics  . Alcohol use: No  . Drug use: No     Allergies   Erythromycin; Other; and Tomato   Review of Systems Review of Systems  All other systems reviewed and are negative.    Physical Exam Updated Vital Signs BP (!) 129/59 (BP Location: Right Arm)   Pulse 68   Temp 98.3 F (36.8 C) (Oral)   Resp 20   Ht 1.575 m ( )   Wt 70.3 kg (155 lb)   SpO2 98%   BMI 28.35 kg/m    Physical Exam  Constitutional: She appears well-developed and well-nourished. No distress.  HENT:  Head: Normocephalic and atraumatic.  Right Ear: External ear normal.  Left Ear: External ear normal.  Eyes: Conjunctivae are normal. Right eye exhibits no discharge. Left eye exhibits no discharge. No scleral icterus.  Neck: Neck supple. No tracheal deviation present.  Cardiovascular: Normal rate, regular rhythm and intact distal pulses.  Pulses:      Radial pulses are 2+ on the right side, and 2+ on the left side.       Dorsalis pedis pulses are 2+ on the right side, and 2+ on the left side.  Pulmonary/Chest: Effort normal and breath sounds normal. No stridor. No respiratory distress. She has no wheezes. She has no rales.  Abdominal: Soft. Bowel sounds are normal. She exhibits no distension. There is no tenderness. There is no rebound and no guarding.  Musculoskeletal: She exhibits no edema or tenderness.  Neurological: She is alert. She has normal strength. No cranial nerve deficit (no facial droop, extraocular movements intact, no slurred speech) or sensory deficit. She exhibits normal muscle tone. She displays no seizure activity. Coordination normal.  Skin: Skin is warm and dry. No rash noted.  Psychiatric: She has a normal mood and affect.  Nursing note and vitals reviewed.    ED Treatments / Results  Labs (all labs ordered are listed, but only abnormal results are displayed) Labs Reviewed  BASIC METABOLIC PANEL - Abnormal; Notable for the following components:      Result Value   Glucose, Bld 120 (*)    All other components within normal limits  CBC  I-STAT TROPONIN, ED  I-STAT TROPONIN, ED    EKG EKG Interpretation  Date/Time:  Saturday Sep 11 2017 11:32:33 EDT Ventricular Rate:  75 PR Interval:  188 QRS Duration: 82 QT Interval:  386 QTC Calculation: 431 R Axis:   28 Text Interpretation:  Normal sinus rhythm Low voltage QRS Cannot rule out Anterior infarct , age  undetermined Abnormal ECG No old tracing to compare Confirmed by Linwood Dibbles [phone]) on 09/11/2017 2:45:27 PM   Radiology Dg Chest 2 View  Result Date: 09/11/2017 CLINICAL DATA:  Pt is having chest pains on both sides with pain radiating to arms shoulders and back.all starting today. EXAM: CHEST - 2 VIEW COMPARISON:  11/11/2010 FINDINGS: The heart size and mediastinal contours are within normal limits. Both lungs are clear. The visualized skeletal structures are unremarkable. IMPRESSION: No active cardiopulmonary disease. Electronically Signed   By: Norva Pavlov M.D.   On: 09/11/2017 13:16    Procedures  Procedures (including critical care time)  Medications Ordered in ED Medications - No data to display   Initial Impression / Assessment and Plan / ED Course  I have reviewed the triage vital signs and the nursing notes.  Pertinent labs & imaging results that were available during my care of the patient were reviewed by me and considered in my medical decision making (see chart for details).  Clinical Course as of Sep 12 1519  Sat Sep 11, 2017  1453 Moderate risk heart score. 4   [JK]  1520 Initial labs and x-rays are reassuring   [JK]    Clinical Course User Index [JK] Linwood Dibbles, MD    Patient presented to the emergency room for evaluation of anginal type symptoms.  Patient had acute onset of pain in her shoulder radiating down her arm associated with diaphoresis and nausea.  Patient was given aspirin and nitroglycerin by EMS and her symptoms have now resolved.  Patient does not have any history of heart disease but there is a family history of heart disease and she has hypercholesterolemia.  Patient is at moderate risk per heart score.  I think it is reasonable to bring her in for observation and serial cardiac enzymes and possible stress testing.  Final Clinical Impressions(s) / ED Diagnoses   Final diagnoses:  Chest pain, unspecified type        Linwood Dibbles, MD 09/11/17  1521

## 2017-09-11 NOTE — ED Notes (Signed)
Attempted report x1. 

## 2017-09-11 NOTE — ED Triage Notes (Signed)
To triage via EMS.  Pt woke up this morning with left shoulder pain radiating to left arm and neck.  No pain with movement.  No other s/s noted.  EMS gave ASA 324 mg and NTG x 2, pain decreased from 6/10 to 4/10 after NTG.   BP prior to NTG 180/90, after NTG 120/78 HR 90

## 2017-09-12 ENCOUNTER — Observation Stay (HOSPITAL_COMMUNITY): Payer: Medicare Other

## 2017-09-12 ENCOUNTER — Other Ambulatory Visit (HOSPITAL_COMMUNITY): Payer: Medicare Other

## 2017-09-12 DIAGNOSIS — K219 Gastro-esophageal reflux disease without esophagitis: Secondary | ICD-10-CM | POA: Diagnosis not present

## 2017-09-12 DIAGNOSIS — R61 Generalized hyperhidrosis: Secondary | ICD-10-CM | POA: Diagnosis not present

## 2017-09-12 DIAGNOSIS — R0789 Other chest pain: Secondary | ICD-10-CM | POA: Diagnosis not present

## 2017-09-12 DIAGNOSIS — R110 Nausea: Secondary | ICD-10-CM | POA: Diagnosis not present

## 2017-09-12 DIAGNOSIS — M79602 Pain in left arm: Secondary | ICD-10-CM | POA: Diagnosis not present

## 2017-09-12 DIAGNOSIS — R079 Chest pain, unspecified: Secondary | ICD-10-CM | POA: Diagnosis not present

## 2017-09-12 DIAGNOSIS — E785 Hyperlipidemia, unspecified: Secondary | ICD-10-CM | POA: Diagnosis not present

## 2017-09-12 LAB — BASIC METABOLIC PANEL
Anion gap: 7 (ref 5–15)
BUN: 16 mg/dL (ref 6–20)
CALCIUM: 8.5 mg/dL — AB (ref 8.9–10.3)
CHLORIDE: 110 mmol/L (ref 101–111)
CO2: 25 mmol/L (ref 22–32)
CREATININE: 0.83 mg/dL (ref 0.44–1.00)
GFR calc non Af Amer: >60 mL/min (ref >60)
GLUCOSE: 104 mg/dL — AB (ref 65–99)
Potassium: 3.8 mmol/L (ref 3.5–5.1)
Sodium: 142 mmol/L (ref 135–145)

## 2017-09-12 LAB — LIPID PANEL
Cholesterol: 151 mg/dL (ref 0–200)
HDL: 37 mg/dL — AB (ref >40)
LDL CALC: 85 mg/dL (ref 0–99)
TRIGLYCERIDES: 144 mg/dL (ref <150)
Total CHOL/HDL Ratio: 4.1 RATIO
VLDL: 29 mg/dL (ref 0–40)

## 2017-09-12 LAB — CBC
HEMATOCRIT: 39 % (ref 36.0–46.0)
HEMOGLOBIN: 13.1 g/dL (ref 12.0–15.0)
MCH: 29.6 pg (ref 26.0–34.0)
MCHC: 33.6 g/dL (ref 30.0–36.0)
MCV: 88 fL (ref 78.0–100.0)
Platelets: 234 10*3/uL (ref 150–400)
RBC: 4.43 MIL/uL (ref 3.87–5.11)
RDW: 12.5 % (ref 11.5–15.5)
WBC: 5.6 10*3/uL (ref 4.0–10.5)

## 2017-09-12 LAB — TROPONIN I: Troponin I: <0.03 ng/mL (ref <0.03)

## 2017-09-12 MED ORDER — ACETAMINOPHEN 500 MG PO TABS: 500.0000 mg | ORAL_TABLET | Freq: Four times a day (QID) | ORAL | 0 refills | Status: DC | PRN

## 2017-09-12 NOTE — Progress Notes (Signed)
Pt has orders to be discharged. Discharge instructions given and pt has no additional questions at this time. Medication regimen reviewed and pt educated. Pt verbalized understanding and has no additional questions. Telemetry box removed. IV removed and site in good condition. Pt stable and waiting for transportation. 

## 2017-09-12 NOTE — Discharge Summary (Signed)
Physician Discharge Summary  Sara Charles JXB:[id number] DOB: Aug 31, 1933 DOA: 09/11/2017  PCP: Deeann Saint, MD  Admit date: 09/11/2017 Discharge date: 09/12/2017  Admitted From: Home Disposition:  Home   Recommendations for Outpatient Follow-up and new medication changes:  1. Follow up with PCP in 1- week 2. Patient ruled out for acute coronary syndrome 3. Patient placed on as needed acetaminophen for pain control.   Home Health: no  Equipment/Devices: no    Discharge Condition: Stable  CODE STATUS: full  Diet recommendation: Heart healthy diet.   Brief/Interim Summary: 84 year old female who presented with back pain.  She does have the significant past medical history for hypertension, dyslipidemia, and GERD.  She developed a sudden dull type pain in her back, radiating to the left elbow and neck, associated with diaphoresis and nausea.  Last stress test was about 10 years ago apparently negative.  On the initial physical examination blood pressure 118/78, heart rate 73, temperature 98.3, respiratory rate 16, oxygen saturation 93%.  Moist mucous membranes, lungs clear to auscultation bilaterally, heart S1-S2 present and rhythmic, no gallops, rubs or murmurs, the abdomen was soft nontender, no lower extremity edema.  Sodium 141, potassium 4.0, chloride 106, bicarb 26, glucose 120, BUN 18, creatinine 0.82, white count 6.1, hemoglobin 15.0, hematocrit 44.1, platelets 262.  Troponin less than 0.03.  Chest x-ray with positive hyperinflation, no infiltrates, but increase lung markings.  EKG with small complexes, first-degree AV block, normal axis, normal intervals, no ST elevations or ST depressions, no significant T wave abnormalities.   Patient was admitted to the hospital with a working diagnosis of atypical chest pain to rule out acute coronary syndrome.   1.  Atypical chest pain.  Patient was admitted to the medical ward, she was placed on a remote telemetry monitor, serial cardiac  enzymes and serial electrocardiograms were negative.  Patient ruled out for acute coronary syndrome.  Her pain is most likely musculoskeletal, she does have pain on palpation on her trapezius muscle bilaterally.  Patient will continue pain control with acetaminophen.  No further cardiac work-up needed at this point.  2.  Dyslipidemia.  Patient will continue ezetimobe  and rosuvastatin.  Total cholesterol was 151, HDL 37, LDL 85, triglycerides 144.   3.  GERD.  Patient has stopped taking her antiacids.  Discharge Diagnoses:  Principal Problem:   Chest pain Active Problems:   Seasonal and perennial allergic rhinitis   Systolic murmur   GERD (gastroesophageal reflux disease)   Hyperlipidemia    Discharge Instructions   Allergies as of 09/12/2017      Reactions   Erythromycin Itching   SEVERE ITCHING   Other Other (See Comments)   NO "ACIDIC" FOODS!! They cause GI upset   Tomato Other (See Comments)   Causes GI upset      Medication List    STOP taking these medications   omeprazole 20 MG capsule Commonly known as:  PRILOSEC   ranitidine 150 MG tablet Commonly known as:  ZANTAC     TAKE these medications   acetaminophen 500 MG tablet Commonly known as:  TYLENOL Take 1 tablet (500 mg total) by mouth every 6 (six) hours as needed (as needed for pain).   CALCIUM + D3 PO Take 1 tablet by mouth daily.   co-enzyme Q-10 30 MG capsule Take 30 mg by mouth 2 (two) times a week.   CRESTOR 10 MG tablet Generic drug:  rosuvastatin TAKE 1 TABLET EVERY DAY What changed:  how much to take  how to take this  when to take this   ezetimibe 10 MG tablet Commonly known as:  ZETIA Take 1 tablet (10 mg total) by mouth daily. What changed:    how much to take  when to take this   Fish Oil 1000 MG Caps Take 1 capsule by mouth daily.   GLUCOSAMINE 1500 COMPLEX PO Take 1 tablet by mouth daily as needed (for joint pain).   MULTIVITAMIN WOMEN 50+ Tabs Take 1 tablet by  mouth daily.   PROBIOTIC DAILY PO Take 1 capsule by mouth daily.   sodium chloride 0.65 % Soln nasal spray Commonly known as:  OCEAN Place 1-2 sprays into both nostrils as needed for congestion.       Allergies  Allergen Reactions  . Erythromycin Itching    SEVERE ITCHING  . Other Other (See Comments)    NO "ACIDIC" FOODS!! They cause GI upset  . Tomato Other (See Comments)    Causes GI upset    Consultations:     Procedures/Studies: Dg Chest 2 View  Result Date: 09/11/2017 CLINICAL DATA:  Pt is having chest pains on both sides with pain radiating to arms shoulders and back.all starting today. EXAM: CHEST - 2 VIEW COMPARISON:  11/11/2010 FINDINGS: The heart size and mediastinal contours are within normal limits. Both lungs are clear. The visualized skeletal structures are unremarkable. IMPRESSION: No active cardiopulmonary disease. Electronically Signed   By: Norva Pavlov M.D.   On: 09/11/2017 13:16       Subjective: Patient is feeling better, positive pain at the neck region and mid back. Patient with no angina symptoms at home.   Discharge Exam: Vitals:   09/12/17 0013 09/12/17 0445  BP: (!) 150/70 (!) 150/68  Pulse: 68 64  Resp: 18 18  Temp: 98.1 F (36.7 C) 98.4 F (36.9 C)  SpO2: 94% 96%   Vitals:   09/11/17 1740 09/11/17 2034 09/12/17 0013 09/12/17 0445  BP: (!) 104/91 121/63 (!) 150/70 (!) 150/68  Pulse: 68 73 68 64  Resp:  Temp: 98.3 F (36.8 C) 97.8 F (36.6 C) 98.1 F (36.7 C) 98.4 F (36.9 C)  TempSrc: Oral Oral Oral Oral  SpO2: 95% 95% 94% 96%  Weight:    69.4 kg (152 lb 14.4 oz)  Height:        General: Not in pain or dyspnea Neurology: Awake and alert, non focal  E ENT: no pallor, no icterus, oral mucosa moist Cardiovascular: No JVD. S1-S2 present, rhythmic, no gallops, rubs, or murmurs. No lower extremity edema. Pulmonary: vesicular breath sounds bilaterally, adequate air movement, no wheezing, rhonchi or  rales. Gastrointestinal. Abdomen flat, no organomegaly, non tender, no rebound or guarding Skin. No rashes Musculoskeletal: no joint deformities/ positive pain when palpated the region of the trapezius muscle.     The results of significant diagnostics from this hospitalization (including imaging, microbiology, ancillary and laboratory) are listed below for reference.     Microbiology: No results found for this or any previous visit (from the past 240 hour(s)).   Labs: BNP (last 3 results) No results for input(s): BNP in the last 8760 hours. Basic Metabolic Panel: Recent Labs  Lab 09/11/17 1146 09/12/17 0026  NA 141 142  K 4.0 3.8  CL 106 110  CO2 26 25  GLUCOSE 120* 104*  BUN 18 16  CREATININE 0.82 0.83  CALCIUM 9.1 8.5*   Liver Function Tests: No results for input(s): AST,  ALT, ALKPHOS, BILITOT, PROT, ALBUMIN in the last 168 hours. No results for input(s): LIPASE, AMYLASE in the last 168 hours. No results for input(s): AMMONIA in the last 168 hours. CBC: Recent Labs  Lab 09/11/17 1146 09/12/17 0026  WBC 6.1 5.6  HGB 15.0 13.1  HCT 44.1 39.0  MCV 88.2 88.0  PLT 262 234   Cardiac Enzymes: Recent Labs  Lab 09/11/17 1811 09/11/17 2200 09/12/17 0026  TROPONINI <0.03 <0.03 <0.03   BNP: Invalid input(s): POCBNP CBG: No results for input(s): GLUCAP in the last 168 hours. D-Dimer No results for input(s): DDIMER in the last 72 hours. Hgb A1c No results for input(s): HGBA1C in the last 72 hours. Lipid Profile Recent Labs    09/12/17 0026  CHOL 151  HDL 37*  LDLCALC 85  TRIG 314  CHOLHDL 4.1   Thyroid function studies No results for input(s): TSH, T4TOTAL, T3FREE, THYROIDAB in the last 72 hours.  Invalid input(s): FREET3 Anemia work up No results for input(s): VITAMINB12, FOLATE, FERRITIN, TIBC, IRON, RETICCTPCT in the last 72 hours. Urinalysis No results found for: COLORURINE, APPEARANCEUR, LABSPEC, PHURINE, GLUCOSEU, HGBUR, BILIRUBINUR, KETONESUR,  PROTEINUR, UROBILINOGEN, NITRITE, LEUKOCYTESUR Sepsis Labs Invalid input(s): PROCALCITONIN,  WBC,  LACTICIDVEN Microbiology No results found for this or any previous visit (from the past 240 hour(s)).   Time coordinating discharge: 45 minutes  SIGNED:   Coralie Keens, MD  [facility] 09/12/2017, 9:43 AM Pager [phone]  If 7PM-7AM, please contact night-coverage [email or web address] Password [email or web address]

## 2017-09-13 ENCOUNTER — Telehealth: Payer: Self-pay | Admitting: Family Medicine

## 2017-09-13 NOTE — Telephone Encounter (Signed)
I left a voice message for pt to return my call.  

## 2017-09-14 NOTE — Telephone Encounter (Signed)
I left a voice message for pt to return my call.  

## 2017-09-15 NOTE — Telephone Encounter (Signed)
I left voice message for pt to return my call. 

## 2017-10-28 ENCOUNTER — Other Ambulatory Visit: Payer: Self-pay | Admitting: Family Medicine

## 2017-10-29 NOTE — Telephone Encounter (Signed)
Called pt left a message to return my call in the office for clarification on her Crestor Rx directions

## 2017-11-03 ENCOUNTER — Telehealth: Payer: Self-pay | Admitting: Family Medicine

## 2017-11-03 ENCOUNTER — Other Ambulatory Visit: Payer: Self-pay

## 2017-11-03 MED ORDER — ROSUVASTATIN CALCIUM 5 MG PO TABS: ORAL_TABLET | ORAL | 0 refills | Status: DC

## 2017-11-03 MED ORDER — EZETIMIBE 10 MG PO TABS: ORAL_TABLET | ORAL | 0 refills | Status: DC

## 2017-11-03 NOTE — Telephone Encounter (Signed)
Copied from CRM [id number]. Topic: Quick Communication - See Telephone Encounter >> Nov 03, 2017  3:41 PM Lorrine KinMcGee, Kelia Gibbon B, NT wrote: CRM for notification. See Telephone encounter for: 11/03/17. Patient calling to leave a message for Dr Salomon FickBanks and her nurse. States that she is only taking 5 mg of crestor daily and 5mg  of zetia every other day. CB#: [phone]

## 2017-11-08 NOTE — Telephone Encounter (Signed)
Spoke with pt clarified with her regarding her concerns on her Crestor and Zetia Rx, Pt also scheduled for an ED folow up with dr Salomon FickBanks on 11/10/2017 at 2.30 pm. Pt voiced understanding

## 2017-11-10 ENCOUNTER — Encounter: Payer: Self-pay | Admitting: Family Medicine

## 2017-11-10 ENCOUNTER — Ambulatory Visit: Payer: Medicare Other | Admitting: Family Medicine

## 2017-11-10 VITALS — BP 120/76 | HR 80 | Temp 97.6°F | Wt 157.0 lb

## 2017-11-10 DIAGNOSIS — K219 Gastro-esophageal reflux disease without esophagitis: Secondary | ICD-10-CM

## 2017-11-10 DIAGNOSIS — E782 Mixed hyperlipidemia: Secondary | ICD-10-CM | POA: Diagnosis not present

## 2017-11-10 NOTE — Patient Instructions (Signed)
Dyslipidemia  Dyslipidemia is an imbalance of waxy, fat-like substances (lipids) in the blood. The body needs lipids in small amounts. Dyslipidemia often involves a high level of cholesterol or triglycerides, which are types of lipids.  Common forms of dyslipidemia include:   High levels of bad cholesterol (LDL cholesterol). LDL is the type of cholesterol that causes fatty deposits (plaques) to build up in the blood vessels that carry blood away from your heart (arteries).   Low levels of good cholesterol (HDL cholesterol). HDL cholesterol is the type of cholesterol that protects against heart disease. High levels of HDL remove the LDL buildup from arteries.   High levels of triglycerides. Triglycerides are a fatty substance in the blood that is linked to a buildup of plaques in the arteries.    You can develop dyslipidemia because of the genes you are born with (primary dyslipidemia) or changes that occur during your life (secondary dyslipidemia), or as a side effect of certain medical treatments.  What are the causes?  Primary dyslipidemia is caused by changes (mutations) in genes that are passed down through families (inherited). These mutations cause several types of dyslipidemia. Mutations can result in disorders that make the body produce too much LDL cholesterol or triglycerides, or not enough HDL cholesterol. These disorders may lead to heart disease, arterial disease, or stroke at an early age.  Causes of secondary dyslipidemia include certain lifestyle choices and diseases that lead to dyslipidemia, such as:   Eating a diet that is high in animal fat.   Not getting enough activity or exercise (having a sedentary lifestyle).   Having diabetes, kidney disease, liver disease, or thyroid disease.   Drinking large amounts of alcohol.   Using certain types of drugs.    What increases the risk?  You may be at greater risk for dyslipidemia if you are an older man or if you are a woman who has gone through  menopause. Other risk factors include:   Having a family history of dyslipidemia.   Taking certain medicines, including birth control pills, steroids, some diuretics, beta-blockers, and some medicines forHIV.   Smoking cigarettes.   Eating a high-fat diet.   Drinking large amounts of alcohol.   Having certain medical conditions such as diabetes, polycystic ovary syndrome (PCOS), pregnancy, kidney disease, liver disease, or hypothyroidism.   Not exercising regularly.   Being overweight or obese with too much belly fat.    What are the signs or symptoms?  Dyslipidemia does not usually cause any symptoms.  Very high lipid levels can cause fatty bumps under the skin (xanthomas) or a white or gray ring around the black center (pupil) of the eye. Very high triglyceride levels can cause inflammation of the pancreas (pancreatitis).  How is this diagnosed?  Your health care provider may diagnose dyslipidemia based on a routine blood test (fasting blood test). Because most people do not have symptoms of the condition, this blood testing (lipid profile) is done on adults age 20 and older and is repeated every 5 years. This test checks:   Total cholesterol. This is a measure of the total amount of cholesterol in your blood, including LDL cholesterol, HDL cholesterol, and triglycerides. A healthy number is below 200.   LDL cholesterol. The target number for LDL cholesterol is different for each person, depending on individual risk factors. For most people, a number below 100 is healthy. Ask your health care provider what your LDL cholesterol number should be.   HDL cholesterol. An HDL   level of 60 or higher is best because it helps to protect against heart disease. A number below 40 for men or below 50 for women increases the risk for heart disease.   Triglycerides. A healthy triglyceride number is below 150.    If your lipid profile is abnormal, your health care provider may do other blood tests to get more  information about your condition.  How is this treated?  Treatment depends on the type of dyslipidemia that you have and your other risk factors for heart disease and stroke. Your health care provider will have a target range for your lipid levels based on this information.  For many people, treatment starts with lifestyle changes, such as diet and exercise. Your health care provider may recommend that you:   Get regular exercise.   Make changes to your diet.   Quit smoking if you smoke.    If diet changes and exercise do not help you reach your goals, your health care provider may also prescribe medicine to lower lipids. The most commonly prescribed type of medicine lowers your LDL cholesterol (statin drug). If you have a high triglyceride level, your provider may prescribe another type of drug (fibrate) or an omega-3 fish oil supplement, or both.  Follow these instructions at home:   Take over-the-counter and prescription medicines only as told by your health care provider. This includes supplements.   Get regular exercise. Start an aerobic exercise and strength training program as told by your health care provider. Ask your health care provider what activities are safe for you. Your health care provider may recommend:  ? 30 minutes of aerobic activity 4-6 days a week. Brisk walking is an example of aerobic activity.  ? Strength training 2 days a week.   Eat a healthy diet as told by your health care provider. This can help you reach and maintain a healthy weight, lower your LDL cholesterol, and raise your HDL cholesterol. It may help to work with a diet and nutrition specialist (dietitian) to make a plan that is right for you. Your dietitian or health care provider may recommend:  ? Limiting your calories, if you are overweight.  ? Eating more fruits, vegetables, whole grains, fish, and lean meats.  ? Limiting saturated fat, trans fat, and cholesterol.   Follow instructions from your health care provider  or dietitian about eating or drinking restrictions.   Limit alcohol intake to no more than one drink per day for nonpregnant women and two drinks per day for men. One drink equals 12 oz of beer, 5 oz of wine, or 1 oz of hard liquor.   Do not use any products that contain nicotine or tobacco, such as cigarettes and e-cigarettes. If you need help quitting, ask your health care provider.   Keep all follow-up visits as told by your health care provider. This is important.  Contact a health care provider if:   You are having trouble sticking to your exercise or diet plan.   You are struggling to quit smoking or control your use of alcohol.  Summary   Dyslipidemia is an imbalance of waxy, fat-like substances (lipids) in the blood. The body needs lipids in small amounts. Dyslipidemia often involves a high level of cholesterol or triglycerides, which are types of lipids.   Treatment depends on the type of dyslipidemia that you have and your other risk factors for heart disease and stroke.   For many people, treatment starts with lifestyle changes, such as   diet and exercise. Your health care provider may also prescribe medicine to lower lipids.  This information is not intended to replace advice given to you by your health care provider. Make sure you discuss any questions you have with your health care provider.  Document Released: 04/25/2013 Document Revised: 12/16/2015 Document Reviewed: 12/16/2015  Elsevier Interactive Patient Education  2018 [employer]  Food Choices to Lower Your Triglycerides  Triglycerides are a type of fat in your blood. High levels of triglycerides can increase the risk of heart disease and stroke. If your triglyceride levels are high, the foods you eat and your eating habits are very important. Choosing the right foods can help lower your triglycerides.  What general guidelines do I need to follow?   Lose weight if you are overweight.   Limit or avoid alcohol.   Fill one half of  your plate with vegetables and green salads.   Limit fruit to two servings a day. Choose fruit instead of juice.   Make one fourth of your plate whole grains. Look for the word "whole" as the first word in the ingredient list.   Fill one fourth of your plate with lean protein foods.   Enjoy fatty fish (such as salmon, mackerel, sardines, and tuna) three times a week.   Choose healthy fats.   Limit foods high in starch and sugar.   Eat more home-cooked food and less restaurant, buffet, and fast food.   Limit fried foods.   Cook foods using methods other than frying.   Limit saturated fats.   Check ingredient lists to avoid foods with partially hydrogenated oils (trans fats) in them.  What foods can I eat?  Grains  Whole grains, such as whole wheat or whole grain breads, crackers, cereals, and pasta. Unsweetened oatmeal, bulgur, barley, quinoa, or brown rice. Corn or whole wheat flour tortillas.  Vegetables  Fresh or frozen vegetables (raw, steamed, roasted, or grilled). Green salads.  Fruits  All fresh, canned (in natural juice), or frozen fruits.  Meat and Other Protein Products  Ground beef (85% or leaner), grass-fed beef, or beef trimmed of fat. Skinless chicken or turkey. Ground chicken or turkey. Pork trimmed of fat. All fish and seafood. Eggs. Dried beans, peas, or lentils. Unsalted nuts or seeds. Unsalted canned or dry beans.  Dairy  Low-fat dairy products, such as skim or 1% milk, 2% or reduced-fat cheeses, low-fat ricotta or cottage cheese, or plain low-fat yogurt.  Fats and Oils  Tub margarines without trans fats. Light or reduced-fat mayonnaise and salad dressings. Avocado. Safflower, olive, or canola oils. Natural peanut or almond butter.  The items listed above may not be a complete list of recommended foods or beverages. Contact your dietitian for more options.  What foods are not recommended?  Grains  White bread. White pasta. White rice. Cornbread. Bagels, pastries, and croissants. Crackers  that contain trans fat.  Vegetables  White potatoes. Corn. Creamed or fried vegetables. Vegetables in a cheese sauce.  Fruits  Dried fruits. Canned fruit in light or heavy syrup. Fruit juice.  Meat and Other Protein Products  Fatty cuts of meat. Ribs, chicken wings, bacon, sausage, bologna, salami, chitterlings, fatback, hot dogs, bratwurst, and packaged luncheon meats.  Dairy  Whole or 2% milk, cream, half-and-half, and cream cheese. Whole-fat or sweetened yogurt. Full-fat cheeses. Nondairy creamers and whipped toppings. Processed cheese, cheese spreads, or cheese curds.  Sweets and Desserts  Corn syrup, sugars, honey, and molasses. Candy. Jam and jelly. Syrup. Sweetened   cereals. Cookies, pies, cakes, donuts, muffins, and ice cream.  Fats and Oils  Butter, stick margarine, lard, shortening, ghee, or bacon fat. Coconut, palm kernel, or palm oils.  Beverages  Alcohol. Sweetened drinks (such as sodas, lemonade, and fruit drinks or punches).  The items listed above may not be a complete list of foods and beverages to avoid. Contact your dietitian for more information.  This information is not intended to replace advice given to you by your health care provider. Make sure you discuss any questions you have with your health care provider.  Document Released: 02/06/2004 Document Revised: 09/26/2015 Document Reviewed: 02/22/2013  Elsevier Interactive Patient Education  2017 [employer]

## 2017-11-10 NOTE — Progress Notes (Signed)
Subjective:    Patient ID: Sara Charles, female    DOB: 22-Apr-1934, 84 y.o.   MRN: [id number]  No chief complaint on file.   HPI Patient was seen today for f/u.  Pt was admitted to the hospital for observation on 5/11-5/04/2018 for chest pain.  Cardiac cause was ruled out.  Pain thought 2/2 musculoskeletal cause.  EKG with first-degree AV block.  Patient states she waited to come in as she had family visiting her from out of town.  Patient admits to eating poorly while they were visiting.  Patient asked if she should take aspirin daily.  Also ask if she still needs to be on Zetia because a nurse in the hospital question her being on both Zetia and Crestor.  Prior to establishing care with this provider patient was on both medications due to elevated triglycerides.  Patient has been taking both medicines every few days.  She denies myalgias.  Pt requesting a copy of her labs. Past Medical History:  Diagnosis Date  . Allergic rhinitis   . Atypical chest pain 09/10/2015  . Bronchitis, acute   . Dyspnea on exertion   . Exudative pleural effusion 5/09    sterile, benign, inflammatory  . GERD (gastroesophageal reflux disease) 09/10/2015  . Systolic murmur 09/10/2015    Allergies  Allergen Reactions  . Erythromycin Itching    SEVERE ITCHING  . Other Other (See Comments)    NO "ACIDIC" FOODS!! They cause GI upset  . Tomato Other (See Comments)    Causes GI upset    ROS General: Denies fever, chills, night sweats, changes in weight, changes in appetite HEENT: Denies headaches, ear pain, changes in vision, rhinorrhea, sore throat CV: Denies CP, palpitations, SOB, orthopnea Pulm: Denies SOB, cough, wheezing GI: Denies abdominal pain, nausea, vomiting, diarrhea, constipation GU: Denies dysuria, hematuria, frequency, vaginal discharge Msk: Denies muscle cramps, joint pains Neuro: Denies weakness, numbness, tingling Skin: Denies rashes, bruising Psych: Denies depression, anxiety,  hallucinations     Objective:    Blood pressure 120/76, pulse 80, temperature 97.6 F (36.4 C), temperature source Oral, weight 157 lb (71.2 kg), SpO2 95 %.   Gen. Pleasant, well-nourished, in no distress, normal affect  HEENT: [place]/AT, face symmetric, no scleral icterus, PERRLA, nares patent without drainage Lungs: no accessory muscle use, CTAB, no wheezes or rales Cardiovascular: RRR, 1/6 murmur, no peripheral edema Neuro:  A&Ox3, CN II-XII intact, normal gait   Wt Readings from Last 3 Encounters:  11/10/17 157 lb (71.2 kg)  09/12/17 152 lb 14.4 oz (69.4 kg)  05/06/17 153 lb (69.4 kg)    Lab Results  Component Value Date   WBC 5.6 09/12/2017   HGB 13.1 09/12/2017   HCT 39.0 09/12/2017   PLT 234 09/12/2017   GLUCOSE 104 (H) 09/12/2017   CHOL 151 09/12/2017   TRIG 144 09/12/2017   HDL 37 (L) 09/12/2017   LDLCALC 85 09/12/2017   ALT 15 03/12/2017   AST 17 03/12/2017   NA 142 09/12/2017   K 3.8 09/12/2017   CL 110 09/12/2017   CREATININE 0.83 09/12/2017   BUN 16 09/12/2017   CO2 25 09/12/2017   INR 1.2 RATIO (H) 09/13/2007   HGBA1C 6.4 03/12/2017    Assessment/Plan:  Mixed hyperlipidemia -discussed recent lipid panel with pt.  Cholesterol well controlled, HDL slightly low -ok to stop Zetia and see how triglycerides are in 6 months -ok to try taking ASA 81 mg  Gastroesophageal reflux disease, esophagitis presence not specified -pt  will try ASA 81 mg, however concerned may cause stomach irritation -avoid food that cause symptoms  F/u prn  Abbe AmsterdamShannon Krithi Bray, MD

## 2018-04-11 ENCOUNTER — Ambulatory Visit (INDEPENDENT_AMBULATORY_CARE_PROVIDER_SITE_OTHER): Payer: Medicare Other

## 2018-04-11 DIAGNOSIS — Z23 Encounter for immunization: Secondary | ICD-10-CM | POA: Diagnosis not present

## 2018-05-09 ENCOUNTER — Encounter: Payer: Medicare Other | Admitting: Family Medicine

## 2018-06-09 ENCOUNTER — Encounter: Payer: Self-pay | Admitting: Internal Medicine

## 2018-06-09 ENCOUNTER — Ambulatory Visit: Payer: Self-pay | Admitting: Internal Medicine

## 2018-08-01 ENCOUNTER — Ambulatory Visit: Payer: Medicare Other | Admitting: Internal Medicine

## 2018-09-12 ENCOUNTER — Other Ambulatory Visit: Payer: Self-pay

## 2018-09-12 ENCOUNTER — Encounter: Payer: Self-pay | Admitting: Internal Medicine

## 2018-09-12 ENCOUNTER — Ambulatory Visit (INDEPENDENT_AMBULATORY_CARE_PROVIDER_SITE_OTHER): Payer: Medicare Other | Admitting: Internal Medicine

## 2018-09-12 DIAGNOSIS — J302 Other seasonal allergic rhinitis: Secondary | ICD-10-CM

## 2018-09-12 DIAGNOSIS — R739 Hyperglycemia, unspecified: Secondary | ICD-10-CM

## 2018-09-12 DIAGNOSIS — R635 Abnormal weight gain: Secondary | ICD-10-CM

## 2018-09-12 DIAGNOSIS — N952 Postmenopausal atrophic vaginitis: Secondary | ICD-10-CM | POA: Insufficient documentation

## 2018-09-12 DIAGNOSIS — Z780 Asymptomatic menopausal state: Secondary | ICD-10-CM

## 2018-09-12 DIAGNOSIS — E559 Vitamin D deficiency, unspecified: Secondary | ICD-10-CM | POA: Diagnosis not present

## 2018-09-12 DIAGNOSIS — J3089 Other allergic rhinitis: Secondary | ICD-10-CM

## 2018-09-12 DIAGNOSIS — E2839 Other primary ovarian failure: Secondary | ICD-10-CM

## 2018-09-12 DIAGNOSIS — K219 Gastro-esophageal reflux disease without esophagitis: Secondary | ICD-10-CM

## 2018-09-12 DIAGNOSIS — E782 Mixed hyperlipidemia: Secondary | ICD-10-CM

## 2018-09-12 DIAGNOSIS — H9313 Tinnitus, bilateral: Secondary | ICD-10-CM

## 2018-09-12 NOTE — Patient Instructions (Addendum)
It was a pleasure talking with you today.  It sounds like you are generally doing well.  I'd like to see you next month to check your ears for wax or any other problem worsening your tinnitus.  If not, we'll need you to get your hearing tested with audiology.  We'll plan to order a bone density, give you your pneumovax pneumonia shot and possibly order a shingrix (shingles series) vaccine to be done at the pharmacy.  In the interim, try to get some walking in to stay healthy--ideally 20 minutes per day of exercise would be great about 5 days per week!  It doesn't have to be intense or fast, just keep moving.

## 2018-09-12 NOTE — Progress Notes (Signed)
Sara ID: Birder RobsonClara Jean Charles, female   DOB: December 09, 1933, 85 y.o.   MRN: [id number] This service is provided via telemedicine  No vital signs collected/recorded due to the encounter was a telemedicine visit.   Location of Sara (ex: home, work):  HOME  Sara consents to a telephone visit:  YES  Location of the provider (ex: office, home):  OFFICE  Name of any referring provider:  N/A  Names of all persons participating in the telemedicine service and their role in the encounter:  Sara, DESHANNON SMITH CMA, Lexy Meininger DO  Time spent on call:  7:16   Provider:  Sakai Wolford L. Renato Gailseed, D.O., C.M.D.  Previous PCP: Deeann SaintBanks, Shannon R, MD Sara Care Team: Deeann SaintBanks, Shannon R, MD as PCP - General [facility](Family Medicine)  Extended Emergency Contact Information Primary Emergency Contact: Merrily BrittleLupis, Carole Address: [address]          [place] Home Phone: [phone] Mobile Phone: [phone] Relation: Sister Secondary Emergency Contact: Adriana ReamsMerritt,Tony  [place] Mobile Phone: [phone] Relation: Friend  Code Status: DNR  Goals of Care: Advanced Directive information Advanced Directives 09/12/2018  Does Sara Have a Medical Advance Directive? Yes  Type of Advance Directive Healthcare Power of Attorney  Does Sara want to make changes to medical advance directive? No - Sara declined  Copy of Healthcare Power of Attorney in Chart? No - copy requested  Would Sara like information on creating a medical advance directive? -   Chief Complaint  Sara presents with  . Establish Care    NEW Sara    HPI: Sara is a 85 y.o. female seen today to establish with [facility].  Records have been requested from Dr. Salomon FickBanks (in epic).  She wanted to switch to a geriatric specialist.  She likes to have labs before her appts.    She is struggling most with her sugar.  It was actually normal last time.  Not on medicine for it.   She worries about it though.  Last lab on file in May of last year show glucose 104.   She saw her vitamin D level was low in her records also.  Cholesterol: on zetia and crestor.  Says she's been having tinnitus also which is driving her crazy.  It's been somewhat new.  She thinks she's always had it.  It's gotten louder though.  She thinks it's there all the time, but louder certain times.  She does not have hearing aids.  She hears ok as far as she knows.  She's read you can get hearing aids that help with tinnitus but she doesn't want to do that.  She's tried taking niacin which has helped a little and did not cause bad flushing side effects.  Her right ear tends to fill with wax easily.  She had it cleaned out last time she went to [facility].  She's never had hearing testing.  Says when she was young, she noticed not hearing well if in a big group.  She generally does not get pain, but she had a pain in her back and knew that women present differently with heart attacks.  She was scared in may of '19 and wen to the ED.  They kept her overnight and ran an EKG with first degree AV block and they determined it was from exercising and pulled a muscle.    She takes zetia and crestor for cholesterol.  No myalgias.  She says when she had  her appendectomy back in 1942, they gave Sara ether.    She has a unilateral oophorectomy on the right 10-15 yrs ago with Dr. Nicholas Lose.  She's not sure what it was but was not malignant.  There was a growth on there.  She could feel pain in there when her heart would beat.   Takes probiotics to help prevent difficulty with GERD. Says that ran in the family.  She has had a prescription for something that helped with indigestion.  She quit zantac due to the mercury in it.  She does use omeprazole.  Will take a period of 4-5 days until her stomach feels better.    Allergic rhinitis:  Uses flonase.  Sometimes just uses saline solution, too.     She's not had a recent bone  density (last normal in 2008 with Dr. Juleen China).  She takes calcium with D, but no additional D3 at this point.   She no longer has a lot of stamina and does not get out and walk like she should.  She was doing yoga before but it was too crowded.  She probably stopped about two years ago.    She lives alone, but has a garage apt and there's a guest who lives there and does the yard, does chores and cooks for her.  She does not like to cook.    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    She has struggled with bladder control problems even as a young adult.  She feels like she does not finish emptying fully.  Will have to go back later to finish.  She didn't want to take any of the bladder pills.  She like myrbetriq but it raised her blood pressure and she didn't want to deal with that.  She goes frequently.    She has had no recent problems with bronchitis or asthma.    Past Medical History:  Diagnosis Date  . Allergic rhinitis   . Atypical chest pain 09/10/2015  . Bronchitis, acute   . Dyspnea on exertion   . Exudative pleural effusion 5/09    sterile, benign, inflammatory  . GERD (gastroesophageal reflux disease) 09/10/2015  . Systolic murmur 09/10/2015   Past Surgical History:  Procedure Laterality Date  . APPENDECTOMY    . OOPHORECTOMY Right   . thoracentesis left  5/09    Social History   Socioeconomic History  . Marital status: Single    Spouse name: Not on file  . Number of children: Not on file  . Years of education: Not on file  . Highest education level: Not on file  Occupational History  . Not on file  Social Needs  . Financial resource strain: Not on file  . Food insecurity:    Worry: Not on file    Inability: Not on file  . Transportation needs:    Medical: Not on file    Non-medical: Not on file  Tobacco Use  . Smoking status: Never Smoker  . Smokeless tobacco: Never Used  Substance and Sexual Activity  .  Alcohol use: No  . Drug use: No  . Sexual activity: Not on file  Lifestyle  . Physical activity:    Days per week: Not on file    Minutes per session: Not on file  . Stress: Not on file  Relationships  . Social connections:    Talks on phone: Not on file    Gets  together: Not on file    Attends religious service: Not on file    Active member of club or organization: Not on file    Attends meetings of clubs or organizations: Not on file    Relationship status: Not on file  Other Topics Concern  . 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       Tobacco use, amount per day now: N/A   Past tobacco use, amount per day: N/A   How many years did you use tobacco: N/A   Alcohol use (drinks per week): N/A   Diet:   Do you drink/eat things with caffeine: CHOCOLATE   Marital status:   SINGLE                               What year were you married?   Do you live in a house, apartment, assisted living, condo, trailer, etc.? HOUSE   Is it one or more stories? ONE   How many persons live in your home? 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            NO                      Type and how often?   Do you have a living will? YES   Do you have a DNR form?                                   If not, do you want to discuss one?   Do you have signed POA/HPOA forms?     YES                   If so, please bring to you appointment    reports that she has never smoked. She has never used smokeless tobacco. She reports that she does not drink alcohol or use drugs.  Functional Status Survey:  independent but gentleman who lives in her garage apt does outdoor work for her and cooks  Family History  Problem Relation Age of Onset  . Allergies Mother   . Heart disease Mother 73  . Cancer Mother   . Heart disease Father 29   . Thyroid cancer Sister     Health Maintenance  Topic Date Due  . TETANUS/TDAP  02/18/1955  . PNA vac Low Risk Adult (1 of 2 - PCV13) 02/17/2001  . INFLUENZA VACCINE  12/03/2018  . DEXA SCAN  Completed    Allergies  Allergen Reactions  . Erythromycin Itching    SEVERE ITCHING  . Other Other (See Comments)    NO "ACIDIC" FOODS!! They cause GI upset  . Tomato Other (See Comments)    Causes GI upset    Outpatient Encounter Medications as of 09/12/2018  Medication Sig  . Calcium Carb-Cholecalciferol (CALCIUM + D3 PO) Take 1 tablet by mouth daily.  Marland Kitchen ezetimibe (ZETIA) 10 MG tablet Take 1 tablet by mouth every other day  . fluticasone (FLONASE) 50 MCG/ACT nasal spray Place 1 spray into both nostrils daily.  . Glucosamine-Chondroit-Vit C-Mn (GLUCOSAMINE 1500 COMPLEX PO) Take 1 tablet by mouth  daily as needed (for joint pain).   . Multiple Vitamins-Minerals (MULTIVITAMIN WOMEN 50+) TABS Take 1 tablet by mouth daily.  . Omega-3 Fatty Acids (FISH OIL) 1000 MG CAPS Take 1 capsule by mouth daily.   Marland Kitchen omeprazole (PRILOSEC) 20 MG capsule Take 20 mg by mouth daily as needed.   . rosuvastatin (CRESTOR) 5 MG tablet Take 1 tablet by mouth daily   No facility-administered encounter medications on file as of 09/12/2018.     Review of Systems  Constitutional: Negative for chills, fever and malaise/fatigue.       Has gained weight over the years  HENT: Positive for congestion and tinnitus.        Bilaterally, louder over time; dry mouth  Eyes:       Dry eyes; cataracts  Respiratory: Positive for sputum production. Negative for cough and shortness of breath.   Cardiovascular: Negative for chest pain.       Murmur  Gastrointestinal: Positive for heartburn. Negative for abdominal pain, blood in stool, constipation, diarrhea, melena, nausea and vomiting.  Genitourinary: Positive for frequency and urgency. Negative for dysuria.       Some incontinence  Musculoskeletal: Positive for joint pain.        Arthralgia  Skin: Negative for itching and rash.  Neurological: Negative for dizziness, loss of consciousness and weakness.  Endo/Heme/Allergies: Positive for polydipsia (very thirsty).  Psychiatric/Behavioral: Negative for depression and memory loss. The Sara is not nervous/anxious and does not have insomnia.     There were no vitals filed for this visit. There is no height or weight on file to calculate BMI. due to non face to face visit  Physical Exam Could not be performed as part of televisit due to non face-to-face visit  Labs reviewed: No labs since ED visit May 2019 (these were reviewed)  Imaging and Procedures noted on new Sara packet: none listed  Assessment/Plan 1. Gastroesophageal reflux disease, esophagitis presence not specified -cont prn PPI which seems to work well for her that way along with probiotic - check b12 due to interference with absorption - Vitamin B12; Future  2. Vitamin D deficiency - cont ca with D, may need to add D3 onto it - VITAMIN D 25 Hydroxy (Vit-D Deficiency, Fractures); Future  3. Weight gain - will ensure nothing is amiss with her thyroid with the weight gain - TSH; Future  4. Hyperglycemia - reports family history of diabetes and worries about this, will f/u labs - COMPLETE METABOLIC PANEL WITH GFR; Future - Hemoglobin A1c; Future  5. Mixed hyperlipidemia - per past notes, cont zetia and crestor - CBC with Differential/Platelet; Future - COMPLETE METABOLIC PANEL WITH GFR; Future - Lipid panel; Future  6. Tinnitus of both ears -will check for cerumen when she comes in -likely needs ENT referral and audiology assessment  7. Seasonal and perennial allergic rhinitis -cont flonase for nasal congestion  8. Postmenopausal -has atrophiic vaginitis; needs f/u bone density b/c it's been since 2008  9. Estrogen deficiency -f/u bone density study, cont ca with D -also check vitamin D level to see if deficient and needs  some D3 also -encourage weightbearing exercise  Labs/tests ordered:  Orders Placed This Encounter  Procedures  . CBC with Differential/Platelet    Standing Status:   Future    Standing Expiration Date:   09/12/2019  . COMPLETE METABOLIC PANEL WITH GFR    Standing Status:   Future    Standing Expiration Date:   09/12/2019  . Hemoglobin  A1c    Standing Status:   Future    Standing Expiration Date:   09/12/2019  . Lipid panel    Standing Status:   Future    Standing Expiration Date:   09/12/2019  . TSH    Standing Status:   Future    Standing Expiration Date:   09/12/2019  . VITAMIN D 25 Hydroxy (Vit-D Deficiency, Fractures)    Standing Status:   Future    Standing Expiration Date:   09/12/2019  . Vitamin B12    Standing Status:   Future    Standing Expiration Date:   09/12/2019   Next appt:  F/u in 1 month for CPE, fasting labs before   At next visit:  Check ears for cerumen, ENT referral, bone density order, pneumovax order, possible shingrix order  Total non face-to-face time:  60 minutes  Tahje Borawski L. Rechel Delosreyes, D.O. Geriatrics [facility] [address], [place] [address] Cell Phone (Mon-Fri 8am-5pm):  [phone] On Call:  [phone] & follow prompts after 5pm & weekends Office Phone:  [phone] Office Fax:  [phone]

## 2018-10-18 ENCOUNTER — Other Ambulatory Visit: Payer: Medicare Other

## 2018-10-18 ENCOUNTER — Other Ambulatory Visit: Payer: Self-pay

## 2018-10-18 DIAGNOSIS — E782 Mixed hyperlipidemia: Secondary | ICD-10-CM

## 2018-10-18 DIAGNOSIS — R635 Abnormal weight gain: Secondary | ICD-10-CM

## 2018-10-18 DIAGNOSIS — K219 Gastro-esophageal reflux disease without esophagitis: Secondary | ICD-10-CM

## 2018-10-18 DIAGNOSIS — R739 Hyperglycemia, unspecified: Secondary | ICD-10-CM

## 2018-10-18 DIAGNOSIS — E559 Vitamin D deficiency, unspecified: Secondary | ICD-10-CM

## 2018-10-19 ENCOUNTER — Encounter: Payer: Self-pay | Admitting: Internal Medicine

## 2018-10-19 LAB — LIPID PANEL
Cholesterol: 230 mg/dL — ABNORMAL HIGH (ref <200)
HDL: 44 mg/dL — ABNORMAL LOW (ref >=50)
LDL Cholesterol (Calc): 155 mg/dL (calc) — ABNORMAL HIGH
Non-HDL Cholesterol (Calc): 186 mg/dL (calc) — ABNORMAL HIGH (ref <130)
Total CHOL/HDL Ratio: 5.2 (calc) — ABNORMAL HIGH (ref <5.0)
Triglycerides: 177 mg/dL — ABNORMAL HIGH (ref <150)

## 2018-10-19 LAB — COMPLETE METABOLIC PANEL WITH GFR
AG Ratio: 1.8 (calc) (ref 1.0–2.5)
ALT: 14 U/L (ref 6–29)
AST: 17 U/L (ref 10–35)
Albumin: 4.2 g/dL (ref 3.6–5.1)
Alkaline phosphatase (APISO): 68 U/L (ref 37–153)
BUN: 18 mg/dL (ref 7–25)
CO2: 24 mmol/L (ref 20–32)
Calcium: 9.4 mg/dL (ref 8.6–10.4)
Chloride: 105 mmol/L (ref 98–110)
Creat: 0.79 mg/dL (ref 0.60–0.88)
GFR, Est African American: 81 mL/min/{1.73_m2} (ref >=60)
GFR, Est Non African American: 70 mL/min/{1.73_m2} (ref >=60)
Globulin: 2.3 g/dL (calc) (ref 1.9–3.7)
Glucose, Bld: 119 mg/dL — ABNORMAL HIGH (ref 65–99)
Potassium: 4 mmol/L (ref 3.5–5.3)
Sodium: 138 mmol/L (ref 135–146)
Total Bilirubin: 0.5 mg/dL (ref 0.2–1.2)
Total Protein: 6.5 g/dL (ref 6.1–8.1)

## 2018-10-19 LAB — CBC WITH DIFFERENTIAL/PLATELET
Absolute Monocytes: 449 cells/uL (ref 200–950)
Basophils Absolute: 31 cells/uL (ref 0–200)
Basophils Relative: 0.6 %
Eosinophils Absolute: 158 cells/uL (ref 15–500)
Eosinophils Relative: 3.1 %
HCT: 45.6 % — ABNORMAL HIGH (ref 35.0–45.0)
Hemoglobin: 15.4 g/dL (ref 11.7–15.5)
Lymphs Abs: 1586 cells/uL (ref 850–3900)
MCH: 29.4 pg (ref 27.0–33.0)
MCHC: 33.8 g/dL (ref 32.0–36.0)
MCV: 87.2 fL (ref 80.0–100.0)
MPV: 10.5 fL (ref 7.5–12.5)
Monocytes Relative: 8.8 %
Neutro Abs: 2876 cells/uL (ref 1500–7800)
Neutrophils Relative %: 56.4 %
Platelets: 258 10*3/uL (ref 140–400)
RBC: 5.23 10*6/uL — ABNORMAL HIGH (ref 3.80–5.10)
RDW: 12.4 % (ref 11.0–15.0)
Total Lymphocyte: 31.1 %
WBC: 5.1 10*3/uL (ref 3.8–10.8)

## 2018-10-19 LAB — VITAMIN D 25 HYDROXY (VIT D DEFICIENCY, FRACTURES): Vit D, 25-Hydroxy: 40 ng/mL (ref 30–100)

## 2018-10-19 LAB — HEMOGLOBIN A1C
Hgb A1c MFr Bld: 6.6 % of total Hgb — ABNORMAL HIGH (ref <5.7)
Mean Plasma Glucose: 143 (calc)
eAG (mmol/L): 7.9 (calc)

## 2018-10-19 LAB — TSH: TSH: 4.84 mIU/L — ABNORMAL HIGH (ref 0.40–4.50)

## 2018-10-19 LAB — VITAMIN B12: Vitamin B-12: 848 pg/mL (ref 200–1100)

## 2018-10-20 ENCOUNTER — Other Ambulatory Visit: Payer: Self-pay

## 2018-10-20 ENCOUNTER — Encounter: Payer: Self-pay | Admitting: Internal Medicine

## 2018-10-20 ENCOUNTER — Ambulatory Visit (INDEPENDENT_AMBULATORY_CARE_PROVIDER_SITE_OTHER): Payer: Medicare Other | Admitting: Internal Medicine

## 2018-10-20 VITALS — BP 120/80 | HR 81 | Temp 98.6°F | Ht 62.0 in | Wt 154.0 lb

## 2018-10-20 DIAGNOSIS — H9313 Tinnitus, bilateral: Secondary | ICD-10-CM

## 2018-10-20 DIAGNOSIS — H6123 Impacted cerumen, bilateral: Secondary | ICD-10-CM | POA: Diagnosis not present

## 2018-10-20 DIAGNOSIS — E785 Hyperlipidemia, unspecified: Secondary | ICD-10-CM

## 2018-10-20 DIAGNOSIS — Z0000 Encounter for general adult medical examination without abnormal findings: Secondary | ICD-10-CM | POA: Diagnosis not present

## 2018-10-20 DIAGNOSIS — R739 Hyperglycemia, unspecified: Secondary | ICD-10-CM

## 2018-10-20 DIAGNOSIS — E559 Vitamin D deficiency, unspecified: Secondary | ICD-10-CM

## 2018-10-20 MED ORDER — ROSUVASTATIN CALCIUM 5 MG PO TABS: ORAL_TABLET | ORAL | 3 refills | Status: DC

## 2018-10-20 MED ORDER — EZETIMIBE 10 MG PO TABS: ORAL_TABLET | ORAL | 3 refills | Status: DC

## 2018-10-20 NOTE — Progress Notes (Signed)
Provider:  Rexene Edison. Mariea Clonts, D.O., C.M.D. Location:   [facility]   Place of Service:   clinic  Previous PCP: Gayland Curry, DO Patient Care Team: Gayland Curry, DO as PCP - General (Geriatric Medicine)  Extended Emergency Contact Information Primary Emergency Contact: Mancel Parsons Address: [place], [place] Phone: [phone] Mobile Phone: [phone] Relation: Sister Secondary Emergency Contact: Maryjo [place] Mobile Phone: [phone] Relation: Friend  Goals of Care: Advanced Directive information Advanced Directives 09/12/2018  Does Patient Have a Medical Advance Directive? Yes  Type of Advance Directive [place]  Does patient want to make changes to medical advance directive? No - Patient declined  Copy of [place] in Chart? No - copy requested  Would patient like information on creating a medical advance directive? -   Chief Complaint  Patient presents with  . Annual Exam    CPE    HPI: Patient is a 85 y.o. female seen today for an annual physical exam.    She is doing well overall since we spoke on her televisit.    Her Vitamin D level is 40 (low normal).  TSH is slightly high.  She has some fatigue and hair thinning but no other obvious symptoms.    Cholesterol is elevated.  She admits she ran out of zetia.  She has also been without her crestor 5mg .  Higher doses cause muscle pains.  Her weight is about the same.  Admits she does not do much exercise.  She sees people out walking and admires them.  She admits she's not motivated.    She did yoga at one time, but quit due to the class size.   There is one near her home.  she's not sure it's open now.  Sugar average has trended up to 6.6 which we discussed is in the diabetic range.    She tries to eat good--she does eat veggies, some meat, avoids pasta, potatoes, etc.  She likes chocolate.   Her mother was diabetic.  Her dad died at 31 of a heart attack.   She had a positive cologuard in 2017.  She then had a virtual colonoscopy in 2018 that was negative.    Past Medical History:  Diagnosis Date  . Allergic rhinitis   . Atypical chest pain 09/10/2015  . Bronchitis, acute   . Dyspnea on exertion   . Exudative pleural effusion 5/09    sterile, benign, inflammatory  . GERD (gastroesophageal reflux disease) 09/10/2015  . Systolic murmur 08/09/5460   Past Surgical History:  Procedure Laterality Date  . APPENDECTOMY    . OOPHORECTOMY Right   . thoracentesis left  5/09    reports that she has never smoked. She has never used smokeless tobacco. She reports that she does not drink alcohol or use drugs.  Functional Status Survey:    Family History  Problem Relation Age of Onset  . Allergies Mother   . Heart disease Mother 46  . Cancer Mother   . Heart disease Father 39  . Thyroid cancer Sister     Health Maintenance  Topic Date Due  . TETANUS/TDAP  02/18/1955  . PNA vac Low Risk Adult (2 of 2 - PPSV23) 04/29/2016  . INFLUENZA VACCINE  12/03/2018  . DEXA SCAN  Completed    Allergies  Allergen Reactions  . Erythromycin Itching  SEVERE ITCHING  . Other Other (See Comments)    NO "ACIDIC" FOODS!! They cause GI upset  . Tomato Other (See Comments)    Causes GI upset    Outpatient Encounter Medications as of 10/20/2018  Medication Sig  . Calcium Carb-Cholecalciferol (CALCIUM + D3 PO) Take 1 tablet by mouth daily.  Marland Kitchen. ezetimibe (ZETIA) 10 MG tablet Take 1 tablet by mouth every other day  . fluticasone (FLONASE) 50 MCG/ACT nasal spray Place 1 spray into both nostrils daily.  . Glucosamine-Chondroit-Vit C-Mn (GLUCOSAMINE 1500 COMPLEX PO) Take 1 tablet by mouth daily as needed (for joint pain).   . Misc Natural Products (FOCUSED MIND PO) Take by mouth as needed.  . Multiple Vitamins-Minerals (HAIR/SKIN/NAILS) TABS Take by mouth as needed.  . Multiple Vitamins-Minerals  (MULTIVITAMIN WOMEN 50+) TABS Take 1 tablet by mouth daily.  . Omega-3 Fatty Acids (FISH OIL) 1000 MG CAPS Take 1 capsule by mouth daily.   Marland Kitchen. omeprazole (PRILOSEC) 20 MG capsule Take 20 mg by mouth daily as needed.   . rosuvastatin (CRESTOR) 5 MG tablet Take 1 tablet by mouth daily   No facility-administered encounter medications on file as of 10/20/2018.     Review of Systems  Constitutional: Negative for chills and fever.       Wt gain  HENT: Positive for tinnitus. Negative for hearing loss.   Eyes: Negative for blurred vision.  Respiratory: Negative for cough and shortness of breath.   Cardiovascular: Negative for chest pain, palpitations and leg swelling.  Gastrointestinal: Negative for abdominal pain, blood in stool, constipation, diarrhea and melena.  Genitourinary: Negative for dysuria.  Musculoskeletal: Negative for back pain, falls and joint pain.  Skin: Negative for itching and rash.  Neurological: Negative for dizziness and loss of consciousness.  Endo/Heme/Allergies: Does not bruise/bleed easily.  Psychiatric/Behavioral: Negative for depression and memory loss. The patient is not nervous/anxious and does not have insomnia.     Vitals:   10/20/18 1402  BP: 120/80  Pulse: 81  Temp: 98.6 F (37 C)  TempSrc: Oral  SpO2: 94%  Weight: 154 lb (69.9 kg)  Height: 5\' 2"  (1.575 m)   Body mass index is 28.17 kg/m. Physical Exam Vitals signs reviewed.  Constitutional:      General: She is not in acute distress.    Appearance: Normal appearance. She is not toxic-appearing.  HENT:     Head: Normocephalic and atraumatic.     Right Ear: External ear normal. There is impacted cerumen.     Left Ear: External ear normal. There is impacted cerumen.     Nose:     Comments: Nose and mouth deferred due to covid masking Eyes:     Extraocular Movements: Extraocular movements intact.     Conjunctiva/sclera: Conjunctivae normal.     Pupils: Pupils are equal, round, and reactive to  light.  Cardiovascular:     Rate and Rhythm: Normal rate and regular rhythm.     Pulses: Normal pulses.     Heart sounds: Normal heart sounds.  Pulmonary:     Effort: Pulmonary effort is normal.     Breath sounds: Normal breath sounds.  Abdominal:     General: Bowel sounds are normal. There is no distension.     Palpations: Abdomen is soft. There is no mass.     Tenderness: There is no abdominal tenderness. There is no guarding or rebound.  Musculoskeletal: Normal range of motion.        General: No swelling or tenderness.  Right lower leg: No edema.     Left lower leg: No edema.  Lymphadenopathy:     Cervical: No cervical adenopathy.  Skin:    General: Skin is warm and dry.     Capillary Refill: Capillary refill takes less than 2 seconds.  Neurological:     General: No focal deficit present.     Mental Status: She is alert and oriented to person, place, and time.     Cranial Nerves: No cranial nerve deficit.     Sensory: No sensory deficit.     Motor: No weakness.     Coordination: Coordination normal.     Gait: Gait normal.     Deep Tendon Reflexes: Reflexes normal.  Psychiatric:        Mood and Affect: Mood normal.        Behavior: Behavior normal.        Thought Content: Thought content normal.        Judgment: Judgment normal.     Labs reviewed: Basic Metabolic Panel: Recent Labs    10/18/18 1035  NA 138  K 4.0  CL 105  CO2 24  GLUCOSE 119*  BUN 18  CREATININE 0.79  CALCIUM 9.4   Liver Function Tests: Recent Labs    10/18/18 1035  AST 17  ALT 14  BILITOT 0.5  PROT 6.5   No results for input(s): LIPASE, AMYLASE in the last 8760 hours. No results for input(s): AMMONIA in the last 8760 hours. CBC: Recent Labs    10/18/18 1035  WBC 5.1  NEUTROABS 2,876  HGB 15.4  HCT 45.6*  MCV 87.2  PLT 258   Cardiac Enzymes: No results for input(s): CKTOTAL, CKMB, CKMBINDEX, TROPONINI in the last 8760 hours. BNP: Invalid input(s): POCBNP Lab Results    Component Value Date   HGBA1C 6.6 (H) 10/18/2018   Lab Results  Component Value Date   TSH 4.84 (H) 10/18/2018   Lab Results  Component Value Date   VITAMINB12 848 10/18/2018   No results found for: FOLATE No results found for: IRON, TIBC, FERRITIN  Assessment/Plan0- 1. Annual physical exam -performed today  2. Hyperlipidemia, unspecified hyperlipidemia type - quite high--had gone up considerably off crestor and zetia--she was out of these--renewed - EKG 12-Lead NSR at 80bpm, no acute ischemia or infarct - rosuvastatin (CRESTOR) 5 MG tablet; Take 1 tablet by mouth daily  Dispense: 90 tablet; Refill: 3 - ezetimibe (ZETIA) 10 MG tablet; Take 1 tablet by mouth every other day  Dispense: 90 tablet; Refill: 3 - Lipid panel; Future  3. Hyperglycemia -elevated glucose--work on diet and try to do some exercise at least yoga - Hemoglobin A1c; Future - Basic metabolic panel; Future  4. Vitamin D deficiency -start vitamin D3 2000 units daily  5. Tinnitus of both ears -ears flushed from cerumen to see if it will help at all  6. Bilateral impacted cerumen -flushed with warm water and peroxide  Get records from [facility] Darci Need[facility]Walter [facility]ut [facility] (saw him before Dr. Salomon FickBanks)--? Pneumovax ever given  Labs/tests ordered:   Lab Orders     Lipid panel     Hemoglobin A1c     Basic metabolic panel  F/u in 4 mos with fasting labs before 02/23/2019  Matison Nuccio L. Heyward Douthit, D.O. Geriatrics [facility] [address] [place], [place] [address] Cell Phone (Mon-Fri 8am-5pm):  [phone] On Call:  [phone] & follow prompts after 5pm & weekends Office Phone:  [phone] Office  Fax:  [phone]

## 2018-10-20 NOTE — Patient Instructions (Signed)
Start Vitamin D3 2000 units daily.  This will help your bone strength, balance and muscle strength over time.

## 2018-12-03 HISTORY — PX: CATARACT EXTRACTION W/ INTRAOCULAR LENS IMPLANT: SHX1309

## 2019-02-20 ENCOUNTER — Other Ambulatory Visit: Payer: Self-pay

## 2019-02-20 ENCOUNTER — Other Ambulatory Visit: Payer: Medicare Other

## 2019-02-20 DIAGNOSIS — R739 Hyperglycemia, unspecified: Secondary | ICD-10-CM

## 2019-02-20 DIAGNOSIS — E785 Hyperlipidemia, unspecified: Secondary | ICD-10-CM

## 2019-02-21 LAB — BASIC METABOLIC PANEL
BUN: 14 mg/dL (ref 7–25)
CO2: 28 mmol/L (ref 20–32)
Calcium: 9.5 mg/dL (ref 8.6–10.4)
Chloride: 106 mmol/L (ref 98–110)
Creat: 0.76 mg/dL (ref 0.60–0.88)
Glucose, Bld: 120 mg/dL — ABNORMAL HIGH (ref 65–99)
Potassium: 4.3 mmol/L (ref 3.5–5.3)
Sodium: 142 mmol/L (ref 135–146)

## 2019-02-21 LAB — LIPID PANEL
Cholesterol: 162 mg/dL (ref <200)
HDL: 43 mg/dL — ABNORMAL LOW (ref >=50)
LDL Cholesterol (Calc): 93 mg/dL (calc)
Non-HDL Cholesterol (Calc): 119 mg/dL (calc) (ref <130)
Total CHOL/HDL Ratio: 3.8 (calc) (ref <5.0)
Triglycerides: 154 mg/dL — ABNORMAL HIGH (ref <150)

## 2019-02-21 LAB — HEMOGLOBIN A1C
Hgb A1c MFr Bld: 6.4 % of total Hgb — ABNORMAL HIGH (ref <5.7)
Mean Plasma Glucose: 137 (calc)
eAG (mmol/L): 7.6 (calc)

## 2019-02-23 ENCOUNTER — Other Ambulatory Visit: Payer: Self-pay

## 2019-02-23 ENCOUNTER — Encounter: Payer: Self-pay | Admitting: Internal Medicine

## 2019-02-23 ENCOUNTER — Ambulatory Visit (INDEPENDENT_AMBULATORY_CARE_PROVIDER_SITE_OTHER): Payer: Medicare Other | Admitting: Internal Medicine

## 2019-02-23 VITALS — BP 136/78 | HR 81 | Temp 97.7°F | Ht 62.0 in | Wt 156.6 lb

## 2019-02-23 DIAGNOSIS — R739 Hyperglycemia, unspecified: Secondary | ICD-10-CM | POA: Diagnosis not present

## 2019-02-23 DIAGNOSIS — Z23 Encounter for immunization: Secondary | ICD-10-CM | POA: Diagnosis not present

## 2019-02-23 DIAGNOSIS — R519 Headache, unspecified: Secondary | ICD-10-CM | POA: Diagnosis not present

## 2019-02-23 DIAGNOSIS — K219 Gastro-esophageal reflux disease without esophagitis: Secondary | ICD-10-CM | POA: Diagnosis not present

## 2019-02-23 DIAGNOSIS — E782 Mixed hyperlipidemia: Secondary | ICD-10-CM

## 2019-02-23 MED ORDER — OMEPRAZOLE 20 MG PO CPDR: 20.0000 mg | DELAYED_RELEASE_CAPSULE | Freq: Every day | ORAL | 1 refills | Status: DC | PRN

## 2019-02-23 NOTE — Patient Instructions (Signed)
Please arrange to get tdap at the pharmacy at your convenience.

## 2019-02-23 NOTE — Progress Notes (Signed)
[facility] Provider:  Christophr Calix L. Renato Gailseed, D.O., C.M.D.  Goals of Care:  Advanced Directives 09/12/2018  Does Patient Have a Medical Advance Directive? Yes  Type of Advance Directive Healthcare Power of Attorney  Does patient want to make changes to medical advance directive? No - Patient declined  Copy of Healthcare Power of Attorney in Chart? No - copy requested  Would patient like information on creating a medical advance directive? -     Chief Complaint  Patient presents with  . Medical Management of Chronic Issues    4 month Follow up. Complains of aching in the Left side. Wants something stronger for reflux medidation.     HPI: Patient is a 85 y.o. female seen today for medical management of chronic diseases.    She got her flu shot today.  Aching of left side of back of her head--sharp pain that does not last long.  She is going to her chiropractor.  It may be something in her neck.  If she thinks about it it aches in that area.  Might act up every two months--has had it three times that she recalls and maybe occasionally over the years before that.  Did have similar feeling when she turned her head once this am.  Asks about aneurysms as cause.    No known family history of aneurysms--her father died suddenly of a heart attack and mother died at 5091 nothing to do with her brain.  She takes probiotics.  She did not get a PPI for her reflux--it is worse.  She was concerned about side effects but now thinks she should take.  She's concerned about her labs.  We reviewed them today with improvements in hba1c from 6.6 to 6.4 and bad cholesterol down to 93 from 155.  She was thinking she'd have to start checking her sugar and take meds.    Past Medical History:  Diagnosis Date  . Allergic rhinitis   . Atypical chest pain 09/10/2015  . Bronchitis, acute   . Dyspnea on exertion   . Exudative pleural effusion 5/09    sterile, benign, inflammatory  . GERD (gastroesophageal  reflux disease) 09/10/2015  . Systolic murmur 09/10/2015    Past Surgical History:  Procedure Laterality Date  . APPENDECTOMY    . OOPHORECTOMY Right   . thoracentesis left  5/09    Allergies  Allergen Reactions  . Erythromycin Itching    SEVERE ITCHING  . Other Other (See Comments)    NO "ACIDIC" FOODS!! They cause GI upset  . Tomato Other (See Comments)    Causes GI upset    Outpatient Encounter Medications as of 02/23/2019  Medication Sig  . Calcium Carb-Cholecalciferol (CALCIUM + D3 PO) Take 1 tablet by mouth daily.  Marland Kitchen. ezetimibe (ZETIA) 10 MG tablet Take 1 tablet by mouth every other day  . fluticasone (FLONASE) 50 MCG/ACT nasal spray Place 1 spray into both nostrils daily.  . Glucosamine-Chondroit-Vit C-Mn (GLUCOSAMINE 1500 COMPLEX PO) Take 1 tablet by mouth daily as needed (for joint pain).   . Misc Natural Products (FOCUSED MIND PO) Take by mouth as needed.  . Multiple Vitamins-Minerals (HAIR/SKIN/NAILS) TABS Take by mouth as needed.  . Multiple Vitamins-Minerals (MULTIVITAMIN WOMEN 50+) TABS Take 1 tablet by mouth daily.  . Omega-3 Fatty Acids (FISH OIL) 1000 MG CAPS Take 1 capsule by mouth daily.   Marland Kitchen. omeprazole (PRILOSEC) 20 MG capsule Take 20 mg by mouth daily as needed.   . rosuvastatin (CRESTOR)  5 MG tablet Take 1 tablet by mouth daily   No facility-administered encounter medications on file as of 02/23/2019.     Review of Systems:  Review of Systems  Constitutional: Negative for fever and malaise/fatigue.  HENT: Negative for hearing loss.   Eyes: Negative for blurred vision and double vision.       Had bilateral cataract surgery since she saw me which went well  Respiratory: Negative for cough and shortness of breath.   Cardiovascular: Negative for chest pain, palpitations, orthopnea and leg swelling.  Gastrointestinal: Positive for heartburn. Negative for abdominal pain, constipation and diarrhea.       Gerd  Genitourinary: Negative for dysuria.   Musculoskeletal: Positive for neck pain. Negative for falls and joint pain.  Skin: Negative for itching and rash.  Neurological: Positive for headaches. Negative for dizziness, sensory change, speech change and loss of consciousness.       Shooting pain into posterior left parietal region for seconds long intervals occasionally; no other symptoms with it  Psychiatric/Behavioral: Negative for depression and memory loss. The patient is nervous/anxious. The patient does not have insomnia.     Health Maintenance  Topic Date Due  . TETANUS/TDAP  02/18/1953  . PNA vac Low Risk Adult (2 of 2 - PPSV23) 04/29/2016  . INFLUENZA VACCINE  12/03/2018  . DEXA SCAN  Completed    Physical Exam: Vitals:   02/23/19 1427  BP: 136/78  Pulse: 81  Temp: 97.7 F (36.5 C)  TempSrc: Oral  SpO2: 95%  Weight: 156 lb 9.6 oz (71 kg)  Height: 5\' 2"  (1.575 m)   Body mass index is 28.64 kg/m. Physical Exam Vitals signs reviewed.  Constitutional:      General: She is not in acute distress.    Appearance: Normal appearance. She is not ill-appearing or toxic-appearing.  HENT:     Head: Normocephalic and atraumatic.  Eyes:     Extraocular Movements: Extraocular movements intact.     Pupils: Pupils are equal, round, and reactive to light.  Neck:     Musculoskeletal: Normal range of motion and neck supple. Muscular tenderness present. No neck rigidity.  Cardiovascular:     Rate and Rhythm: Normal rate and regular rhythm.     Pulses: Normal pulses.     Heart sounds: Normal heart sounds.  Pulmonary:     Effort: Pulmonary effort is normal.     Breath sounds: Normal breath sounds. No wheezing, rhonchi or rales.  Abdominal:     General: Bowel sounds are normal.  Musculoskeletal: Normal range of motion.        General: Tenderness present.     Right lower leg: No edema.     Left lower leg: No edema.     Comments: Trapezius tenderness into neck, worse on left; no tenderness of left parietal area of scalp  and no symptoms experienced during visit  Lymphadenopathy:     Cervical: No cervical adenopathy.  Skin:    General: Skin is warm and dry.     Capillary Refill: Capillary refill takes less than 2 seconds.  Neurological:     General: No focal deficit present.     Mental Status: She is alert and oriented to person, place, and time.  Psychiatric:        Mood and Affect: Mood normal.     Comments: A bit fidgety upon arrival, but calmed down over visit     Labs reviewed: Basic Metabolic Panel: Recent Labs    10/18/18  1035 02/20/19 1045  NA 138 142  K 4.0 4.3  CL 105 106  CO2 24 28  GLUCOSE 119* 120*  BUN 18 14  CREATININE 0.79 0.76  CALCIUM 9.4 9.5  TSH 4.84*  --    Liver Function Tests: Recent Labs    10/18/18 1035  AST 17  ALT 14  BILITOT 0.5  PROT 6.5   No results for input(s): LIPASE, AMYLASE in the last 8760 hours. No results for input(s): AMMONIA in the last 8760 hours. CBC: Recent Labs    10/18/18 1035  WBC 5.1  NEUTROABS 2,876  HGB 15.4  HCT 45.6*  MCV 87.2  PLT 258   Lipid Panel: Recent Labs    10/18/18 1035 02/20/19 1045  CHOL 230* 162  HDL 44* 43*  LDLCALC 155* 93  TRIG 177* 154*  CHOLHDL 5.2* 3.8   Lab Results  Component Value Date   HGBA1C 6.4 (H) 02/20/2019    Assessment/Plan 1. Gastroesophageal reflux disease, unspecified whether esophagitis present -has been hesitant to take the prilosec and did not fill before but symptoms more bothersome now so she's wanting to try again and does not plan on daily use--wants to take for a few day course to calm things down when her symptoms act up and reports this has worked for her (despite mechanism of action and typical use of this med) - omeprazole (PRILOSEC) 20 MG capsule; Take 1 capsule (20 mg total) by mouth daily as needed.  Dispense: 30 capsule; Refill: 1  2. Nonintractable episodic headache, unspecified headache type -suspect neuropathic/cervicogenic etiology due to some neck pain and  trapezius pain she sees chiropractor for -advised to get this addressed and see if headaches persist -if they do, we may need to image her brain/CTA due to aneurysm concern (though rare and unlikely)  3. Hyperglycemia -improved with dietary changes since last visit - continue to monitor--no need for meds at this level - Hemoglobin A1c; Future  4. Mixed hyperlipidemia - bad cholesterol much improved from last visit and triglycerides close to goal--encouraged continued dietary modifications and medication as is - CBC with Differential/Platelet; Future - COMPLETE METABOLIC PANEL WITH GFR; Future - Lipid panel; Future  5. Need for influenza vaccination - Flu Vaccine QUAD High Dose(Fluad) given  6.  Need for tdap:  Go get tdap at her convenience at pharmacy where covered by insurance  Labs/tests ordered:  Lab Orders     CBC with Differential/Platelet     COMPLETE METABOLIC PANEL WITH GFR     Hemoglobin A1c     Lipid panel  Next appt:  6 mos for med mgt, also schedule pneumovax in a couple of weeks (previously had prevnar)   Lynlee Stratton L. Lidia Clavijo, D.O. [facility] [address], [place] [address] Cell Phone (Mon-Fri 8am-5pm):  [phone] On Call:  [phone] & follow prompts after 5pm & weekends Office Phone:  [phone] Office Fax:  [phone]

## 2019-03-09 ENCOUNTER — Ambulatory Visit (INDEPENDENT_AMBULATORY_CARE_PROVIDER_SITE_OTHER): Payer: Medicare Other

## 2019-03-09 ENCOUNTER — Other Ambulatory Visit: Payer: Self-pay

## 2019-03-09 DIAGNOSIS — Z23 Encounter for immunization: Secondary | ICD-10-CM

## 2019-07-20 DIAGNOSIS — H04123 Dry eye syndrome of bilateral lacrimal glands: Secondary | ICD-10-CM | POA: Diagnosis not present

## 2019-07-20 DIAGNOSIS — H35371 Puckering of macula, right eye: Secondary | ICD-10-CM | POA: Diagnosis not present

## 2019-07-20 DIAGNOSIS — Z961 Presence of intraocular lens: Secondary | ICD-10-CM | POA: Diagnosis not present

## 2019-08-07 ENCOUNTER — Encounter (HOSPITAL_COMMUNITY): Payer: Self-pay | Admitting: *Deleted

## 2019-08-07 ENCOUNTER — Emergency Department (HOSPITAL_COMMUNITY)
Admission: EM | Admit: 2019-08-07 | Discharge: 2019-08-07 | Disposition: A | Discharge status: Home / Self Care | Payer: Medicare PPO | Attending: Emergency Medicine | Admitting: Emergency Medicine

## 2019-08-07 ENCOUNTER — Other Ambulatory Visit: Payer: Self-pay

## 2019-08-07 DIAGNOSIS — Z79899 Other long term (current) drug therapy: Secondary | ICD-10-CM | POA: Diagnosis not present

## 2019-08-07 DIAGNOSIS — L299 Pruritus, unspecified: Secondary | ICD-10-CM | POA: Diagnosis not present

## 2019-08-07 DIAGNOSIS — L509 Urticaria, unspecified: Secondary | ICD-10-CM | POA: Diagnosis not present

## 2019-08-07 DIAGNOSIS — L500 Allergic urticaria: Secondary | ICD-10-CM | POA: Insufficient documentation

## 2019-08-07 MED ORDER — PREDNISONE 20 MG PO TABS: 40.0000 mg | ORAL_TABLET | Freq: Once | ORAL | Status: DC

## 2019-08-07 MED ORDER — DIPHENHYDRAMINE HCL 50 MG/ML IJ SOLN
25.0000 mg | Freq: Once | INTRAMUSCULAR | Status: AC
  Administered 2019-08-07: 25 mg via INTRAMUSCULAR
  Filled 2019-08-07: qty 1

## 2019-08-07 MED ORDER — FAMOTIDINE 20 MG PO TABS
20.0000 mg | ORAL_TABLET | Freq: Once | ORAL | Status: AC
  Administered 2019-08-07: 20 mg via ORAL
  Filled 2019-08-07: qty 1

## 2019-08-07 MED ORDER — DEXAMETHASONE SODIUM PHOSPHATE 10 MG/ML IJ SOLN
10.0000 mg | Freq: Once | INTRAMUSCULAR | Status: AC
  Administered 2019-08-07: 10 mg via INTRAMUSCULAR
  Filled 2019-08-07: qty 1

## 2019-08-07 NOTE — ED Provider Notes (Signed)
[facility] EMERGENCY DEPARTMENT Provider Note   CSN: [id number] Arrival date & time: 08/07/19  0247     History Chief Complaint  Patient presents with  . Allergic Reaction    Sara Charles is a 86 y.o. female who presents with an allergic reaction. She states that she ate creamed beef yesterday afternoon and soon after she developed diffuse hives on her neck, trunk, back, arms, and legs. The rash has been gradually worsening and is very itchy. She thinks she is allergic to the creamed beef because she read the label and it has dairy and wheat in it which she states she is allergic too. However she also states she's had creamed beef before without any problems. She denies any new meds, soaps, lotions, detergents. No lightheadedness, syncope, oral swelling, SOB, wheezing. She has not tried anything for her symptoms because she didn't know what to take.   HPI     Past Medical History:  Diagnosis Date  . Allergic rhinitis   . Atypical chest pain 09/10/2015  . Bronchitis, acute   . Dyspnea on exertion   . Exudative pleural effusion 5/09    sterile, benign, inflammatory  . GERD (gastroesophageal reflux disease) 09/10/2015  . Systolic murmur 09/10/2015    Patient Active Problem List   Diagnosis Date Noted  . Tinnitus of both ears 09/12/2018  . Hyperglycemia 09/12/2018  . Weight gain 09/12/2018  . Vitamin D deficiency 09/12/2018  . Atrophic vaginitis 09/12/2018  . Chest pain 09/11/2017  . Mixed hyperlipidemia 09/11/2017  . Systolic murmur 09/10/2015  . Atypical chest pain 09/10/2015  . GERD (gastroesophageal reflux disease) 09/10/2015  . PNEUMONIA 09/13/2007  . EFFUSION, PLEURAL 09/13/2007  . BRONCHITIS, ACUTE 09/12/2007  . Seasonal and perennial allergic rhinitis 04/20/2007  . DYSPNEA ON EXERTION 04/20/2007    Past Surgical History:  Procedure Laterality Date  . APPENDECTOMY    . CATARACT EXTRACTION W/ INTRAOCULAR LENS IMPLANT Bilateral 12/2018  .  OOPHORECTOMY Right   . thoracentesis left  5/09     OB History   No obstetric history on file.     Family History  Problem Relation Age of Onset  . Allergies Mother   . Heart disease Mother 75  . Cancer Mother   . Heart disease Father 18  . Thyroid cancer Sister     Social History   Tobacco Use  . Smoking status: Never Smoker  . Smokeless tobacco: Never Used  Substance Use Topics  . Alcohol use: No  . Drug use: No    Home Medications Prior to Admission medications   Medication Sig Start Date End Date Taking? Authorizing Provider  Calcium Carb-Cholecalciferol (CALCIUM + D3 PO) Take 1 tablet by mouth daily.    [provider]  ezetimibe (ZETIA) 10 MG tablet Take 1 tablet by mouth every other day 10/20/18   Renato Gails, Tiffany L, DO  fluticasone (FLONASE) 50 MCG/ACT nasal spray Place 1 spray into both nostrils daily.    [provider]  Glucosamine-Chondroit-Vit C-Mn (GLUCOSAMINE 1500 COMPLEX PO) Take 1 tablet by mouth daily as needed (for joint pain).     [provider]  Misc Natural Products (FOCUSED MIND PO) Take by mouth as needed.    [provider]  Multiple Vitamins-Minerals (HAIR/SKIN/NAILS) TABS Take by mouth as needed.    [provider]  Multiple Vitamins-Minerals (MULTIVITAMIN WOMEN 50+) TABS Take 1 tablet by mouth daily.    [provider]  Omega-3 Fatty Acids (FISH OIL) 1000  MG CAPS Take 1 capsule by mouth daily.     [provider]  omeprazole (PRILOSEC) 20 MG capsule Take 1 capsule (20 mg total) by mouth daily as needed. 02/23/19   Reed, Tiffany L, DO  rosuvastatin (CRESTOR) 5 MG tablet Take 1 tablet by mouth daily 10/20/18   Reed, Tiffany L, DO    Allergies    Erythromycin, Other, and Tomato  Review of Systems   Review of Systems  Gastrointestinal: Negative for abdominal pain, nausea and vomiting.  Skin: Positive for rash.  Neurological: Negative for syncope and light-headedness.  All other  systems reviewed and are negative.   Physical Exam Updated Vital Signs BP (!) 141/69 (BP Location: Right Arm)   Pulse 94   Temp 98.3 F (36.8 C) (Oral)   Resp 18   SpO2 95%   Physical Exam Vitals and nursing note reviewed.  Constitutional:      General: She is not in acute distress.    Appearance: Normal appearance. She is well-developed. She is not ill-appearing.     Comments: Calm and cooperative  HENT:     Head: Normocephalic and atraumatic.  Eyes:     General: No scleral icterus.       Right eye: No discharge.        Left eye: No discharge.     Conjunctiva/sclera: Conjunctivae normal.     Pupils: Pupils are equal, round, and reactive to light.  Cardiovascular:     Rate and Rhythm: Normal rate and regular rhythm.  Pulmonary:     Effort: Pulmonary effort is normal. No respiratory distress.     Breath sounds: Normal breath sounds.  Abdominal:     General: There is no distension.  Musculoskeletal:     Cervical back: Normal range of motion.  Skin:    General: Skin is warm and dry.     Findings: Rash (diffuse hives on the neck, chest, back, bilateral arms and legs) present.  Neurological:     Mental Status: She is alert and oriented to person, place, and time.  Psychiatric:        Behavior: Behavior normal.     ED Results / Procedures / Treatments   Labs (all labs ordered are listed, but only abnormal results are displayed) Labs Reviewed - No data to display  EKG None  Radiology No results found.  Procedures Procedures (including critical care time)  Medications Ordered in ED Medications  diphenhydrAMINE (BENADRYL) injection 25 mg (25 mg Intramuscular Given 08/07/19 0806)  famotidine (PEPCID) tablet 20 mg (20 mg Oral Given 08/07/19 0803)  dexamethasone (DECADRON) injection 10 mg (10 mg Intramuscular Given 08/07/19 0804)    ED Course  I have reviewed the triage vital signs and the nursing notes.  Pertinent labs & imaging results that were available during  my care of the patient were reviewed by me and considered in my medical decision making (see chart for details).  86 year old female presents with hives after eating cream and beef yesterday.  Her blood pressure is mildly elevated but otherwise vital signs are normal.  She has diffuse hives on exam.  No evidence of anaphylaxis.  Will treat with IM Benadryl, Pepcid, IM Decadron and reassess.  Shared visit with Dr. Sabra Heck.  On reexamination symptoms have completely resolved.  Advised to continue Benadryl as needed every 6 hours for itching or hives.  She is advised to return if worsening.  MDM Rules/Calculators/A&P  Final Clinical Impression(s) / ED Diagnoses Final diagnoses:  Hives    Rx / DC Orders ED Discharge Orders    None       Bethel Born, PA-C 08/07/19 0945    Eber Hong, MD 08/09/19 [phone]

## 2019-08-07 NOTE — ED Provider Notes (Signed)
Medical screening examination/treatment/procedure(s) were conducted as a shared visit with non-physician practitioner(s) and myself.  I personally evaluated the patient during the encounter.  Clinical Impression:   Final diagnoses:  Hives    This patient presents with a complaint of a rash, started yesterday, urticarial, involves almost the entire body and on exam has urticaria covering face neck chest arms and legs.  She has no shortness of breath or swelling of the mouth of the oropharynx, she has no wheezing, she has no nausea or vomiting, she does not appear to be anaphylactic.  Blood pressure is 141/69, we will treat aggressively for urticaria, the patient is not a diabetic, steroids antihistamines and anticipate discharge.  She does recall changing her laundry detergent but this was over a month ago.   Eber Hong, MD 08/09/19 [phone]

## 2019-08-07 NOTE — ED Triage Notes (Signed)
The pt was eating some beef mid day yesterday  She has been itching over her body since then  She came in because it will not go away.  She has not taken anything for the itching   No sob

## 2019-08-07 NOTE — Discharge Instructions (Addendum)
Take Benadryl 25mg  as needed every 6 hours for rash or itching Please return if you are worsening

## 2019-08-07 NOTE — ED Notes (Signed)
Hives are gone, pt is no longer itching

## 2019-08-07 NOTE — ED Notes (Signed)
Pt is itching, red blotchy hives over arms/legs and trunk noted. Ambulated to bathroom without difficulty.

## 2019-08-21 ENCOUNTER — Other Ambulatory Visit: Payer: Medicare PPO

## 2019-08-21 ENCOUNTER — Other Ambulatory Visit: Payer: Self-pay

## 2019-08-21 DIAGNOSIS — E782 Mixed hyperlipidemia: Secondary | ICD-10-CM

## 2019-08-21 DIAGNOSIS — R739 Hyperglycemia, unspecified: Secondary | ICD-10-CM

## 2019-08-22 LAB — CBC WITH DIFFERENTIAL/PLATELET
Absolute Monocytes: 504 cells/uL (ref 200–950)
Basophils Absolute: 50 cells/uL (ref 0–200)
Basophils Relative: 0.7 %
Eosinophils Absolute: 216 cells/uL (ref 15–500)
Eosinophils Relative: 3 %
HCT: 46.6 % — ABNORMAL HIGH (ref 35.0–45.0)
Hemoglobin: 15.5 g/dL (ref 11.7–15.5)
Lymphs Abs: 1800 cells/uL (ref 850–3900)
MCH: 29.6 pg (ref 27.0–33.0)
MCHC: 33.3 g/dL (ref 32.0–36.0)
MCV: 88.9 fL (ref 80.0–100.0)
MPV: 10.2 fL (ref 7.5–12.5)
Monocytes Relative: 7 %
Neutro Abs: 4630 cells/uL (ref 1500–7800)
Neutrophils Relative %: 64.3 %
Platelets: 287 10*3/uL (ref 140–400)
RBC: 5.24 10*6/uL — ABNORMAL HIGH (ref 3.80–5.10)
RDW: 12.2 % (ref 11.0–15.0)
Total Lymphocyte: 25 %
WBC: 7.2 10*3/uL (ref 3.8–10.8)

## 2019-08-22 LAB — LIPID PANEL
Cholesterol: 162 mg/dL (ref <200)
HDL: 46 mg/dL — ABNORMAL LOW (ref >=50)
LDL Cholesterol (Calc): 92 mg/dL (calc)
Non-HDL Cholesterol (Calc): 116 mg/dL (calc) (ref <130)
Total CHOL/HDL Ratio: 3.5 (calc) (ref <5.0)
Triglycerides: 143 mg/dL (ref <150)

## 2019-08-22 LAB — HEMOGLOBIN A1C
Hgb A1c MFr Bld: 6.7 % of total Hgb — ABNORMAL HIGH (ref <5.7)
Mean Plasma Glucose: 146 (calc)
eAG (mmol/L): 8.1 (calc)

## 2019-08-22 LAB — COMPLETE METABOLIC PANEL WITH GFR
AG Ratio: 1.6 (calc) (ref 1.0–2.5)
ALT: 13 U/L (ref 6–29)
AST: 15 U/L (ref 10–35)
Albumin: 4 g/dL (ref 3.6–5.1)
Alkaline phosphatase (APISO): 71 U/L (ref 37–153)
BUN: 18 mg/dL (ref 7–25)
CO2: 26 mmol/L (ref 20–32)
Calcium: 9.6 mg/dL (ref 8.6–10.4)
Chloride: 105 mmol/L (ref 98–110)
Creat: 0.81 mg/dL (ref 0.60–0.88)
GFR, Est African American: 78 mL/min/{1.73_m2} (ref >=60)
GFR, Est Non African American: 67 mL/min/{1.73_m2} (ref >=60)
Globulin: 2.5 g/dL (calc) (ref 1.9–3.7)
Glucose, Bld: 128 mg/dL — ABNORMAL HIGH (ref 65–99)
Potassium: 4 mmol/L (ref 3.5–5.3)
Sodium: 140 mmol/L (ref 135–146)
Total Bilirubin: 0.5 mg/dL (ref 0.2–1.2)
Total Protein: 6.5 g/dL (ref 6.1–8.1)

## 2019-08-22 NOTE — Progress Notes (Signed)
Bad cholesterol is about the same.  We could consider using a new medication that is a combination with zetia b/c she does not want to take statins. Triglycerides (starchy fats) did improve to normal range. Kidneys, liver, electrolytes were all ok.   Blood counts are on the higher side.

## 2019-08-24 ENCOUNTER — Encounter: Payer: Self-pay | Admitting: Internal Medicine

## 2019-08-24 ENCOUNTER — Ambulatory Visit (INDEPENDENT_AMBULATORY_CARE_PROVIDER_SITE_OTHER): Payer: Medicare PPO | Admitting: Internal Medicine

## 2019-08-24 ENCOUNTER — Other Ambulatory Visit: Payer: Self-pay

## 2019-08-24 VITALS — BP 122/78 | HR 77 | Temp 97.7°F | Ht 62.0 in | Wt 155.0 lb

## 2019-08-24 DIAGNOSIS — E119 Type 2 diabetes mellitus without complications: Secondary | ICD-10-CM | POA: Diagnosis not present

## 2019-08-24 DIAGNOSIS — K219 Gastro-esophageal reflux disease without esophagitis: Secondary | ICD-10-CM | POA: Diagnosis not present

## 2019-08-24 DIAGNOSIS — J302 Other seasonal allergic rhinitis: Secondary | ICD-10-CM

## 2019-08-24 DIAGNOSIS — E782 Mixed hyperlipidemia: Secondary | ICD-10-CM

## 2019-08-24 MED ORDER — FLUTICASONE PROPIONATE 50 MCG/ACT NA SUSP: 1.0000 | Freq: Every day | NASAL | 1 refills | Status: DC

## 2019-08-24 MED ORDER — OMEPRAZOLE 20 MG PO CPDR: 20.0000 mg | DELAYED_RELEASE_CAPSULE | Freq: Every day | ORAL | 1 refills | Status: DC | PRN

## 2019-08-24 NOTE — Patient Instructions (Signed)
Try to snack on healthier things:  hummus and veggies or fruit, greek yogurt, other fresh fruit

## 2019-08-24 NOTE — Progress Notes (Signed)
Location:  [facility] clinic Provider:  Modene Andy L. Renato Gails, D.O., C.M.D.  Goals of Care:  Advanced Directives 08/07/2019  Does Patient Have a Medical Advance Directive? No  Type of Advance Directive -  Does patient want to make changes to medical advance directive? -  Copy of Healthcare Power of Attorney in Chart? -  Would patient like information on creating a medical advance directive? -     Chief Complaint  Patient presents with  . Medical Management of Chronic Issues    6 month follow up w prior labs    HPI: Patient is a 86 y.o. female seen today for medical management of chronic diseases.    She feels like she is doing ok.  She was told she was breathing heavy when walking fast.  She thinks she breathes through her mouth.  Not really tight in her chest.  Nose feels stopped up.  Not coughing.  Occasional sneeze.  Has had allergies.  Was on shots at one time, but they improved and she quit that.  She does not take her cholesterol medication faithfully due to muscle pain.  Also takes zetia.    Sugar average trended up to 6.7 from 6.4.  She had brought in a bunch of flowers, then ate a bunch of milk and wheat products, then broke out in a rash--had hives.  She was given two shots and a square pink pill.  Then took benadryl to stop itching--took for 5 days and that ended April 10th.  Did have decadron shots.   Past Medical History:  Diagnosis Date  . Allergic rhinitis   . Atypical chest pain 09/10/2015  . Bronchitis, acute   . Dyspnea on exertion   . Exudative pleural effusion 5/09    sterile, benign, inflammatory  . GERD (gastroesophageal reflux disease) 09/10/2015  . Systolic murmur 09/10/2015    Past Surgical History:  Procedure Laterality Date  . APPENDECTOMY    . CATARACT EXTRACTION W/ INTRAOCULAR LENS IMPLANT Bilateral 12/2018  . OOPHORECTOMY Right   . thoracentesis left  5/09    Allergies  Allergen Reactions  . Erythromycin Itching    SEVERE ITCHING  . Other Other (See  Comments)    NO "ACIDIC" FOODS!! They cause GI upset  . Tomato Other (See Comments)    Causes GI upset    Outpatient Encounter Medications as of 08/24/2019  Medication Sig  . Calcium Carb-Cholecalciferol (CALCIUM + D3 PO) Take 1 tablet by mouth daily.  Marland Kitchen ezetimibe (ZETIA) 10 MG tablet Take 1 tablet by mouth every other day  . fluticasone (FLONASE) 50 MCG/ACT nasal spray Place 1 spray into both nostrils daily.  . Glucosamine-Chondroit-Vit C-Mn (GLUCOSAMINE 1500 COMPLEX PO) Take 1 tablet by mouth daily as needed (for joint pain).   . Misc Natural Products (FOCUSED MIND PO) Take by mouth as needed.  . Multiple Vitamins-Minerals (HAIR/SKIN/NAILS) TABS Take by mouth as needed.  . Multiple Vitamins-Minerals (MULTIVITAMIN WOMEN 50+) TABS Take 1 tablet by mouth daily.  . Omega-3 Fatty Acids (FISH OIL) 1000 MG CAPS Take 1 capsule by mouth daily.   Marland Kitchen omeprazole (PRILOSEC) 20 MG capsule Take 1 capsule (20 mg total) by mouth daily as needed.  . rosuvastatin (CRESTOR) 5 MG tablet Take 1 tablet by mouth daily   No facility-administered encounter medications on file as of 08/24/2019.    Review of Systems:  Review of Systems  Constitutional: Negative for chills, fever and malaise/fatigue.  HENT: Negative for hearing loss.   Eyes: Negative for  blurred vision.  Respiratory: Negative for cough and shortness of breath.   Cardiovascular: Negative for chest pain, palpitations and leg swelling.  Gastrointestinal: Negative for abdominal pain.  Genitourinary: Negative for dysuria.  Musculoskeletal: Positive for myalgias. Negative for falls.  Skin: Negative for itching and rash.  Neurological: Negative for dizziness and loss of consciousness.  Endo/Heme/Allergies: Positive for environmental allergies.  Psychiatric/Behavioral: Negative for depression and memory loss. The patient is nervous/anxious. The patient does not have insomnia.     Health Maintenance  Topic Date Due  . COVID-19 Vaccine (1) Never  done  . TETANUS/TDAP  Never done  . INFLUENZA VACCINE  12/03/2019  . DEXA SCAN  Completed  . PNA vac Low Risk Adult  Completed    Physical Exam: Vitals:   08/24/19 1532  BP: 122/78  Pulse: 77  Temp: 97.7 F (36.5 C)  TempSrc: Temporal  SpO2: 92%  Weight: 155 lb (70.3 kg)  Height: 5\' 2"  (1.575 m)   Body mass index is 28.35 kg/m. Physical Exam Vitals reviewed.  Constitutional:      Appearance: Normal appearance.  HENT:     Right Ear: There is no impacted cerumen.     Left Ear: There is no impacted cerumen.  Cardiovascular:     Rate and Rhythm: Normal rate and regular rhythm.     Pulses: Normal pulses.     Heart sounds: Normal heart sounds.  Pulmonary:     Effort: Pulmonary effort is normal.     Breath sounds: Normal breath sounds. No wheezing, rhonchi or rales.  Abdominal:     General: Bowel sounds are normal.  Musculoskeletal:        General: Normal range of motion.  Skin:    General: Skin is warm and dry.  Neurological:     General: No focal deficit present.     Mental Status: She is alert and oriented to person, place, and time.  Psychiatric:     Comments: A bit anxious and jittery     Labs reviewed: Basic Metabolic Panel: Recent Labs    10/18/18 1035 02/20/19 1045 08/21/19 1046  NA 138 142 140  K 4.0 4.3 4.0  CL 105 106 105  CO2 24 28 26   GLUCOSE 119* 120* 128*  BUN 18 14 18   CREATININE 0.79 0.76 0.81  CALCIUM 9.4 9.5 9.6  TSH 4.84*  --   --    Liver Function Tests: Recent Labs    10/18/18 1035 08/21/19 1046  AST 17 15  ALT 14 13  BILITOT 0.5 0.5  PROT 6.5 6.5   No results for input(s): LIPASE, AMYLASE in the last 8760 hours. No results for input(s): AMMONIA in the last 8760 hours. CBC: Recent Labs    10/18/18 1035 08/21/19 1046  WBC 5.1 7.2  NEUTROABS 2,876 4,630  HGB 15.4 15.5  HCT 45.6* 46.6*  MCV 87.2 88.9  PLT 258 287   Lipid Panel: Recent Labs    10/18/18 1035 02/20/19 1045 08/21/19 1046  CHOL 230* 162 162  HDL  44* 43* 46*  LDLCALC 10/20/18* 93 92  TRIG 177* 154* 143  CHOLHDL 5.2* 3.8 3.5   Lab Results  Component Value Date   HGBA1C 6.7 (H) 08/21/2019   Assessment/Plan 1. Controlled type 2 diabetes mellitus without complication, without long-term current use of insulin (HCC) -hba1c has jumped up into diabetic range now -discussed dietary changes extensively and she will try to cut down on the candy bars and eat more healthy snacks in  their place -f/u next time -she agrees to medication if hba1c not trending down  2. Gastroesophageal reflux disease, unspecified whether esophagitis present - omeprazole (PRILOSEC) 20 MG capsule; Take 1 capsule (20 mg total) by mouth daily as needed.  Dispense: 30 capsule; Refill: 1  3. Seasonal allergies - bothersome recently with some dyspnea, but no wheezing or lung findings--sounds stuffy in the nose - fluticasone (FLONASE) 50 MCG/ACT nasal spray; Place 1 spray into both nostrils daily.  Dispense: 1 g; Refill: 1  4. Mixed hyperlipidemia -try new medication for this:  nexlizet daily--samples provided -she will call her insurance and find out cost/coverage situation -stop crestor and zetia   Labs/tests ordered:    Next appt:  12/25/2019   Damarian Priola L. Cassidi Modesitt, D.O. [facility] [address], [place] [address] Cell Phone (Mon-Fri 8am-5pm):  [phone] On Call:  [phone] & follow prompts after 5pm & weekends Office Phone:  [phone] Office Fax:  [phone]

## 2019-08-25 MED ORDER — NEXLIZET 180-10 MG PO TABS: 1.0000 | ORAL_TABLET | Freq: Every day | ORAL | 0 refills | Status: DC

## 2019-09-14 ENCOUNTER — Telehealth: Payer: Self-pay | Admitting: Internal Medicine

## 2019-09-14 NOTE — Telephone Encounter (Signed)
Wants to discuss diabetic med options & maybe reck sugar again  Thanks, Misty Stanley

## 2019-09-15 NOTE — Telephone Encounter (Signed)
I called patient back and she just wanted to review labwork and understand it alittle better.  Stated that she is going to work on her diet. Sweets are her down fall but will work on eliminating them from her diet. Stated that she will wait till her appointment in August that way it gives her a chance to work on the diet. Will call sooner if any questions or concerns.

## 2019-11-09 ENCOUNTER — Ambulatory Visit (INDEPENDENT_AMBULATORY_CARE_PROVIDER_SITE_OTHER): Payer: Medicare PPO | Admitting: Nurse Practitioner

## 2019-11-09 ENCOUNTER — Encounter: Payer: Self-pay | Admitting: Nurse Practitioner

## 2019-11-09 ENCOUNTER — Telehealth: Payer: Self-pay

## 2019-11-09 ENCOUNTER — Other Ambulatory Visit: Payer: Self-pay

## 2019-11-09 DIAGNOSIS — Z0000 Encounter for general adult medical examination without abnormal findings: Secondary | ICD-10-CM | POA: Diagnosis not present

## 2019-11-09 DIAGNOSIS — E2839 Other primary ovarian failure: Secondary | ICD-10-CM | POA: Diagnosis not present

## 2019-11-09 NOTE — Telephone Encounter (Signed)
Ms. Sara Charles, Sara Charles are scheduled for a virtual visit with your provider today.    Just as we do with appointments in the office, we must obtain your consent to participate.  Your consent will be active for this visit and any virtual visit you may have with one of our providers in the next 365 days.    If you have a MyChart account, I can also send a copy of this consent to you electronically.  All virtual visits are billed to your insurance company just like a traditional visit in the office.  As this is a virtual visit, video technology does not allow for your provider to perform a traditional examination.  This may limit your provider's ability to fully assess your condition.  If your provider identifies any concerns that need to be evaluated in person or the need to arrange testing such as labs, EKG, etc, we will make arrangements to do so.    Although advances in technology are sophisticated, we cannot ensure that it will always work on either your end or our end.  If the connection with a video visit is poor, we may have to switch to a telephone visit.  With either a video or telephone visit, we are not always able to ensure that we have a secure connection.   I need to obtain your verbal consent now.   Are you willing to proceed with your visit today?   Sara Charles has provided verbal consent on 11/09/2019 for a virtual visit (video or telephone).   Elveria Royals, CMA 11/09/2019  11:23 AM

## 2019-11-09 NOTE — Progress Notes (Signed)
Subjective:   Sara Charles is a 86 y.o. female who presents for Medicare Annual (Subsequent) preventive examination.  Review of Systems     Cardiac Risk Factors include: advanced age (>6455men, 1>65 women);family history of premature cardiovascular disease;diabetes mellitus     Objective:    There were no vitals filed for this visit. There is no height or weight on file to calculate BMI.  Advanced Directives 11/09/2019 08/07/2019 09/12/2018 09/11/2017  Does Patient Have a Medical Advance Directive? Yes No Yes No  Type of Advance Directive Healthcare Power of Attorney - Healthcare Power of Attorney -  Does patient want to make changes to medical advance directive? No - Patient declined - No - Patient declined -  Copy of Healthcare Power of Attorney in Chart? No - copy requested - No - copy requested -  Would patient like information on creating a medical advance directive? - - - No - Patient declined    Current Medications (verified) Outpatient Encounter Medications as of 11/09/2019  Medication Sig   Bempedoic Acid-Ezetimibe (NEXLIZET) 180-10 MG TABS Take 1 tablet by mouth daily.   Calcium Carb-Cholecalciferol (CALCIUM + D3 PO) Take 1 tablet by mouth daily.   ezetimibe (ZETIA) 10 MG tablet Take 10 mg by mouth daily. 5 mg 1/2 tablet daily. May take every other day   Glucosamine-Chondroit-Vit C-Mn (GLUCOSAMINE 1500 COMPLEX PO) Take 1 tablet by mouth daily as needed (for joint pain).    Misc Natural Products (FOCUSED MIND PO) Take by mouth as needed.   Multiple Vitamins-Minerals (HAIR/SKIN/NAILS) TABS Take by mouth as needed.   Multiple Vitamins-Minerals (MULTIVITAMIN WOMEN 50+) TABS Take 1 tablet by mouth daily.   Omega-3 Fatty Acids (FISH OIL) 1000 MG CAPS Take 1 capsule by mouth daily.    Probiotic Product (UP4 PROBIOTICS PO) Take by mouth daily. 1 or two as needed   rosuvastatin (CRESTOR) 5 MG tablet Take 5 mg by mouth daily. May take every other day   Soft Lens Products  (RA SALINE SOLUTION) SOLN by Does not apply route.   [DISCONTINUED] fluticasone (FLONASE) 50 MCG/ACT nasal spray Place 1 spray into both nostrils daily.   [DISCONTINUED] omeprazole (PRILOSEC) 20 MG capsule Take 1 capsule (20 mg total) by mouth daily as needed.   No facility-administered encounter medications on file as of 11/09/2019.    Allergies (verified) Statins, Erythromycin, Other, and Tomato   History: Past Medical History:  Diagnosis Date   Allergic rhinitis    Atypical chest pain 09/10/2015   Bronchitis, acute    Dyspnea on exertion    Exudative pleural effusion 5/09    sterile, benign, inflammatory   GERD (gastroesophageal reflux disease) 09/10/2015   Systolic murmur 09/10/2015   Past Surgical History:  Procedure Laterality Date   APPENDECTOMY     CATARACT EXTRACTION W/ INTRAOCULAR LENS IMPLANT Bilateral 12/2018   OOPHORECTOMY Right    thoracentesis left  5/09   Family History  Problem Relation Age of Onset   Allergies Mother    Heart disease Mother 3689   Cancer Mother    Heart disease Father 2363   Thyroid cancer Sister    Social History   Socioeconomic History   Marital status: Single    Spouse name: Not on file   Number of children: Not on file   Years of education: Not on file   Highest education level: Not on file  Occupational History   Occupation: retired    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       Tobacco use, amount per day now: N/A   Past tobacco use, amount per day: N/A   How many years did you use tobacco: N/A   Alcohol use (drinks per week): N/A   Diet:   Do you drink/eat things with  caffeine: CHOCOLATE   Marital status:   SINGLE                               What year were you married?   Do you live in a house, apartment, assisted living, condo, trailer, etc.? HOUSE   Is it one or more stories? ONE   How many persons live in your home? 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            NO                      Type and how often?   Do you have a living will? YES   Do you have a DNR form?                                   If not, do you want to discuss one?   Do you have signed POA/HPOA forms?     YES                   If so, please bring to you appointment   Social Determinants of Health   Financial Resource Strain:    Difficulty of Paying Living Expenses:   Food Insecurity:    Worried About Programme researcher, broadcasting/film/video in the Last Year:    Barista in the Last Year:   Transportation Needs:    Freight forwarder (Medical):    Lack of Transportation (Non-Medical):   Physical Activity:    Days of Exercise per Week:    Minutes of Exercise per Session:   Stress:    Feeling of Stress :   Social Connections:    Frequency of Communication with Friends and Family:    Frequency of Social Gatherings with Friends and Family:    Attends Religious Services:    Active Member of Clubs or Organizations:    Attends Engineer, structural:    Marital Status:     Tobacco Counseling Counseling given: Not Answered   Clinical Intake:  Pre-visit preparation completed: Yes  Pain : No/denies pain     BMI - recorded: 28 Nutritional Status: BMI 25 -29 Overweight Nutritional Risks: None Diabetes: Yes CBG done?: No Did pt. bring in CBG monitor from home?: No  How often do you need to have someone help you when you read instructions, pamphlets, or other written materials from your doctor or pharmacy?: 1 -  Never  Diabetic?yes  Activities of Daily Living In your present state of health, do you have any difficulty performing the following activities: 11/09/2019  Hearing? N  Vision? N  Difficulty concentrating or making decisions? N  Walking or climbing stairs? N  Dressing or bathing? N  Doing errands, shopping? N  Preparing Food and eating ? N  Using the Toilet? N  In the past six months, have you accidently leaked urine? N  Do you have problems with loss of bowel control? N  Managing your Medications? N  Managing your Finances? N  Housekeeping or managing your Housekeeping? N  Some recent data might be hidden    Patient Care Team: Kermit Balo, DO as PCP - General (Geriatric Medicine) [name], MD as Consulting Physician (Ophthalmology)  Indicate any recent Medical Services you may have received from other than [facility] providers in the past year (date may be approximate).     Assessment:   This is a routine wellness examination for Sara Charles.  Hearing/Vision screen  Hearing Screening   125Hz  250Hz  500Hz  1000Hz  2000Hz  3000Hz  4000Hz  6000Hz  8000Hz   Right ear:           Left ear:           Comments: No hearing issues  Vision Screening Comments: Patient wears reading glasses. Patient had eye exam within the past year (before cataract surgery)  Dietary issues and exercise activities discussed: Current Exercise Habits: The patient does not participate in regular exercise at present  Goals   None    Depression Screen PHQ 2/9 Scores 11/09/2019 10/20/2018 09/12/2018 03/11/2017  PHQ - 2 Score 0 0 0 0    Fall Risk Fall Risk  11/09/2019 10/20/2018 09/12/2018 03/11/2017  Falls in the past year? 0 0 0 No  Number falls in past yr: 0 0 0 -  Injury with Fall? 0 0 0 -    Any stairs in or around the home? Yes  If so, are there any without handrails? Yes  Home free of loose throw rugs in walkways, pet beds, electrical cords, etc? Yes  Adequate lighting in your home to reduce  risk of falls? Yes   ASSISTIVE DEVICES UTILIZED TO PREVENT FALLS:  Life alert? No  Use of a cane, walker or w/c? No  Grab bars in the bathroom? Yes  Shower chair or bench in shower? No  Elevated toilet seat or a handicapped toilet? Yes   TIMED UP AND GO:na  Cognitive Function:     6CIT Screen 11/09/2019  What Year? 0 points  What month? 0 points  What time? 0 points  Count back from 20 0 points  Months in reverse 0 points  Repeat phrase 2 points  Total Score 2    Immunizations Immunization History  Administered Date(s) Administered   Fluad Quad(high Dose 65+) 02/23/2019   Influenza Split 02/02/2011, 04/03/2012   Influenza, High Dose Seasonal PF 03/12/2017, 04/11/2018   PFIZER SARS-COV-2 Vaccination 06/26/2019, 07/28/2019   Pneumococcal Conjugate-13 04/30/2015   Pneumococcal Polysaccharide-23 03/09/2019    TDAP status: Due, Education has been provided regarding the importance of this vaccine. Advised may receive this vaccine at local pharmacy or Health Dept. Aware to provide a copy of the vaccination record if obtained from local pharmacy or Health Dept. Verbalized acceptance and understanding. Flu Vaccine status: Up to date Pneumococcal vaccine status: Up to date Covid-19 vaccine status: Completed vaccines  Qualifies for Shingles Vaccine? Yes   Zostavax completed Yes   Shingrix Completed?: Yes  Screening Tests Health  Maintenance  Topic Date Due   URINE MICROALBUMIN  Never done   TETANUS/TDAP  Never done   INFLUENZA VACCINE  12/03/2019   DEXA SCAN  Completed   COVID-19 Vaccine  Completed   PNA vac Low Risk Adult  Completed    Health Maintenance  Health Maintenance Due  Topic Date Due   URINE MICROALBUMIN  Never done   TETANUS/TDAP  Never done    Colorectal cancer screening: No longer required.  Mammogram status: No longer required.  Bone Density status: Ordered today. Pt provided with contact info and advised to call to schedule  appt.  Lung Cancer Screening: (Low Dose CT Chest recommended if Age 58-80 years, 30 pack-year currently smoking OR have quit w/in 15years.) does not qualify.   Lung Cancer Screening Referral: na  Additional Screening:  Hepatitis C Screening: does not qualify; Completed na  Vision Screening: Recommended annual ophthalmology exams for early detection of glaucoma and other disorders of the eye. Is the patient up to date with their annual eye exam?  Yes  Who is the provider or what is the name of the office in which the patient attends annual eye exams? DR Hazle Quant If pt is not established with a provider, would they like to be referred to a provider to establish care? No .   Dental Screening: Recommended annual dental exams for proper oral hygiene  Community Resource Referral / Chronic Care Management: CRR required this visit?  No   CCM required this visit?  No      Plan:     I have personally reviewed and noted the following in the patients chart:    Medical and social history  Use of alcohol, tobacco or illicit drugs   Current medications and supplements  Functional ability and status  Nutritional status  Physical activity  Advanced directives  List of other physicians  Hospitalizations, surgeries, and ER visits in previous 12 months  Vitals  Screenings to include cognitive, depression, and falls  Referrals and appointments  In addition, I have reviewed and discussed with patient certain preventive protocols, quality metrics, and best practice recommendations. A written personalized care plan for preventive services as well as general preventive health recommendations were provided to patient.     Sharon Seller, NP   11/09/2019

## 2019-11-09 NOTE — Progress Notes (Signed)
This service is provided via telemedicine  No vital signs collected/recorded due to the encounter was a telemedicine visit.   Location of patient (ex: home, work):  Home  Patient consents to a telephone visit:  Yes, see telephone encounter dated 11/09/2019  Location of the provider (ex: office, home): [employer]  Name of any referring provider:  Tiffany reed, DO  Names of all persons participating in the telemedicine service and their role in the encounter:  Abbey Chatters, Nurse Practitioner, Elveria Royals, CMA, and patient.   Time spent on call:  29 minutes with medical assistant

## 2019-11-09 NOTE — Patient Instructions (Signed)
Sara Charles , Thank you for taking time to come for your Medicare Wellness Visit. I appreciate your ongoing commitment to your health goals. Please review the following plan we discussed and let me know if I can assist you in the future.   Screening recommendations/referrals: Colonoscopy aged out Mammogram aged out Bone Density ordered today Recommended yearly ophthalmology/optometry visit for glaucoma screening and checkup Recommended yearly dental visit for hygiene and checkup  Vaccinations: Influenza vaccine up to date Pneumococcal vaccine up to date Tdap vaccine DUE- recommended to get vaccine at your local pharmacy  Shingles vaccine DUE- to get shingrix at local pharmacy     Advanced directives: to complete and bring medical POA, living will, and DNR to office to place on file.   Conditions/risks identified: advanced age, weakness, memory loss   Next appointment: 1 year    Preventive Care 84 Years and Older, Female Preventive care refers to lifestyle choices and visits with your health care provider that can promote health and wellness. What does preventive care include?  A yearly physical exam. This is also called an annual well check.  Dental exams once or twice a year.  Routine eye exams. Ask your health care provider how often you should have your eyes checked.  Personal lifestyle choices, including:  Daily care of your teeth and gums.  Regular physical activity.  Eating a healthy diet.  Avoiding tobacco and drug use.  Limiting alcohol use.  Practicing safe sex.  Taking low-dose aspirin every day.  Taking vitamin and mineral supplements as recommended by your health care provider. What happens during an annual well check? The services and screenings done by your health care provider during your annual well check will depend on your age, overall health, lifestyle risk factors, and family history of disease. Counseling  Your health care provider may ask you  questions about your:  Alcohol use.  Tobacco use.  Drug use.  Emotional well-being.  Home and relationship well-being.  Sexual activity.  Eating habits.  History of falls.  Memory and ability to understand (cognition).  Work and work Astronomer.  Reproductive health. Screening  You may have the following tests or measurements:  Height, weight, and BMI.  Blood pressure.  Lipid and cholesterol levels. These may be checked every 5 years, or more frequently if you are over 55 years old.  Skin check.  Lung cancer screening. You may have this screening every year starting at age 66 if you have a 30-pack-year history of smoking and currently smoke or have quit within the past 15 years.  Fecal occult blood test (FOBT) of the stool. You may have this test every year starting at age 59.  Flexible sigmoidoscopy or colonoscopy. You may have a sigmoidoscopy every 5 years or a colonoscopy every 10 years starting at age 3.  Hepatitis C blood test.  Hepatitis B blood test.  Sexually transmitted disease (STD) testing.  Diabetes screening. This is done by checking your blood sugar (glucose) after you have not eaten for a while (fasting). You may have this done every 1-3 years.  Bone density scan. This is done to screen for osteoporosis. You may have this done starting at age 17.  Mammogram. This may be done every 1-2 years. Talk to your health care provider about how often you should have regular mammograms. Talk with your health care provider about your test results, treatment options, and if necessary, the need for more tests. Vaccines  Your health care provider may recommend certain  vaccines, such as:  Influenza vaccine. This is recommended every year.  Tetanus, diphtheria, and acellular pertussis (Tdap, Td) vaccine. You may need a Td booster every 10 years.  Zoster vaccine. You may need this after age 46.  Pneumococcal 13-valent conjugate (PCV13) vaccine. One dose is  recommended after age 72.  Pneumococcal polysaccharide (PPSV23) vaccine. One dose is recommended after age 20. Talk to your health care provider about which screenings and vaccines you need and how often you need them. This information is not intended to replace advice given to you by your health care provider. Make sure you discuss any questions you have with your health care provider. Document Released: 05/17/2015 Document Revised: 01/08/2016 Document Reviewed: 02/19/2015 Elsevier Interactive Patient Education  2017 [employer].  Fall Prevention in the Home Falls can cause injuries. They can happen to people of all ages. There are many things you can do to make your home safe and to help prevent falls. What can I do on the outside of my home?  Regularly fix the edges of walkways and driveways and fix any cracks.  Remove anything that might make you trip as you walk through a door, such as a raised step or threshold.  Trim any bushes or trees on the path to your home.  Use bright outdoor lighting.  Clear any walking paths of anything that might make someone trip, such as rocks or tools.  Regularly check to see if handrails are loose or broken. Make sure that both sides of any steps have handrails.  Any raised decks and porches should have guardrails on the edges.  Have any leaves, snow, or ice cleared regularly.  Use sand or salt on walking paths during winter.  Clean up any spills in your garage right away. This includes oil or grease spills. What can I do in the bathroom?  Use night lights.  Install grab bars by the toilet and in the tub and shower. Do not use towel bars as grab bars.  Use non-skid mats or decals in the tub or shower.  If you need to sit down in the shower, use a plastic, non-slip stool.  Keep the floor dry. Clean up any water that spills on the floor as soon as it happens.  Remove soap buildup in the tub or shower regularly.  Attach bath mats  securely with double-sided non-slip rug tape.  Do not have throw rugs and other things on the floor that can make you trip. What can I do in the bedroom?  Use night lights.  Make sure that you have a light by your bed that is easy to reach.  Do not use any sheets or blankets that are too big for your bed. They should not hang down onto the floor.  Have a firm chair that has side arms. You can use this for support while you get dressed.  Do not have throw rugs and other things on the floor that can make you trip. What can I do in the kitchen?  Clean up any spills right away.  Avoid walking on wet floors.  Keep items that you use a lot in easy-to-reach places.  If you need to reach something above you, use a strong step stool that has a grab bar.  Keep electrical cords out of the way.  Do not use floor polish or wax that makes floors slippery. If you must use wax, use non-skid floor wax.  Do not have throw rugs and other things  on the floor that can make you trip. What can I do with my stairs?  Do not leave any items on the stairs.  Make sure that there are handrails on both sides of the stairs and use them. Fix handrails that are broken or loose. Make sure that handrails are as long as the stairways.  Check any carpeting to make sure that it is firmly attached to the stairs. Fix any carpet that is loose or worn.  Avoid having throw rugs at the top or bottom of the stairs. If you do have throw rugs, attach them to the floor with carpet tape.  Make sure that you have a light switch at the top of the stairs and the bottom of the stairs. If you do not have them, ask someone to add them for you. What else can I do to help prevent falls?  Wear shoes that:  Do not have high heels.  Have rubber bottoms.  Are comfortable and fit you well.  Are closed at the toe. Do not wear sandals.  If you use a stepladder:  Make sure that it is fully opened. Do not climb a closed  stepladder.  Make sure that both sides of the stepladder are locked into place.  Ask someone to hold it for you, if possible.  Clearly mark and make sure that you can see:  Any grab bars or handrails.  First and last steps.  Where the edge of each step is.  Use tools that help you move around (mobility aids) if they are needed. These include:  Canes.  Walkers.  Scooters.  Crutches.  Turn on the lights when you go into a dark area. Replace any light bulbs as soon as they burn out.  Set up your furniture so you have a clear path. Avoid moving your furniture around.  If any of your floors are uneven, fix them.  If there are any pets around you, be aware of where they are.  Review your medicines with your doctor. Some medicines can make you feel dizzy. This can increase your chance of falling. Ask your doctor what other things that you can do to help prevent falls. This information is not intended to replace advice given to you by your health care provider. Make sure you discuss any questions you have with your health care provider. Document Released: 02/14/2009 Document Revised: 09/26/2015 Document Reviewed: 05/25/2014 Elsevier Interactive Patient Education  2017 [employer].

## 2019-11-10 ENCOUNTER — Ambulatory Visit: Payer: Medicare PPO | Admitting: Family

## 2019-11-20 DIAGNOSIS — Z809 Family history of malignant neoplasm, unspecified: Secondary | ICD-10-CM | POA: Diagnosis not present

## 2019-11-20 DIAGNOSIS — Z833 Family history of diabetes mellitus: Secondary | ICD-10-CM | POA: Diagnosis not present

## 2019-11-20 DIAGNOSIS — Z7722 Contact with and (suspected) exposure to environmental tobacco smoke (acute) (chronic): Secondary | ICD-10-CM | POA: Diagnosis not present

## 2019-11-20 DIAGNOSIS — R030 Elevated blood-pressure reading, without diagnosis of hypertension: Secondary | ICD-10-CM | POA: Diagnosis not present

## 2019-11-20 DIAGNOSIS — Z8249 Family history of ischemic heart disease and other diseases of the circulatory system: Secondary | ICD-10-CM | POA: Diagnosis not present

## 2019-11-20 DIAGNOSIS — G8929 Other chronic pain: Secondary | ICD-10-CM | POA: Diagnosis not present

## 2019-11-20 DIAGNOSIS — E785 Hyperlipidemia, unspecified: Secondary | ICD-10-CM | POA: Diagnosis not present

## 2019-11-20 DIAGNOSIS — Z87891 Personal history of nicotine dependence: Secondary | ICD-10-CM | POA: Diagnosis not present

## 2019-12-21 ENCOUNTER — Other Ambulatory Visit: Payer: Self-pay

## 2019-12-21 ENCOUNTER — Other Ambulatory Visit: Payer: Medicare PPO

## 2019-12-21 DIAGNOSIS — E782 Mixed hyperlipidemia: Secondary | ICD-10-CM | POA: Diagnosis not present

## 2019-12-21 DIAGNOSIS — E119 Type 2 diabetes mellitus without complications: Secondary | ICD-10-CM | POA: Diagnosis not present

## 2019-12-22 LAB — BASIC METABOLIC PANEL
BUN: 18 mg/dL (ref 7–25)
CO2: 29 mmol/L (ref 20–32)
Calcium: 9.8 mg/dL (ref 8.6–10.4)
Chloride: 104 mmol/L (ref 98–110)
Creat: 0.84 mg/dL (ref 0.60–0.88)
Glucose, Bld: 131 mg/dL — ABNORMAL HIGH (ref 65–99)
Potassium: 4.2 mmol/L (ref 3.5–5.3)
Sodium: 140 mmol/L (ref 135–146)

## 2019-12-22 LAB — LIPID PANEL
Cholesterol: 149 mg/dL (ref <200)
HDL: 51 mg/dL (ref >=50)
LDL Cholesterol (Calc): 78 mg/dL (calc)
Non-HDL Cholesterol (Calc): 98 mg/dL (calc) (ref <130)
Total CHOL/HDL Ratio: 2.9 (calc) (ref <5.0)
Triglycerides: 121 mg/dL (ref <150)

## 2019-12-22 LAB — HEMOGLOBIN A1C
Hgb A1c MFr Bld: 6.3 % of total Hgb — ABNORMAL HIGH (ref <5.7)
Mean Plasma Glucose: 134 (calc)
eAG (mmol/L): 7.4 (calc)

## 2019-12-22 NOTE — Progress Notes (Signed)
Good news--sugar average has trended down to 6.3 from 6.7 4 mos ago. Cholesterol has improved--bad remains a little above normal at 78 (ideally less than 70 with diabetes).  I looked at the last lab note and it said we were going to try her on the nexlizet; however, her voicemail was full at that time.  Now zetia and nexlizet and her every other day crestor are on her med list.  Which ones is she taking?  Did she actually ever get nexlizet or was it too expensive? Electrolytes and kidneys were normal.

## 2019-12-25 ENCOUNTER — Ambulatory Visit (INDEPENDENT_AMBULATORY_CARE_PROVIDER_SITE_OTHER): Payer: Medicare PPO | Admitting: Internal Medicine

## 2019-12-25 ENCOUNTER — Encounter: Payer: Self-pay | Admitting: Internal Medicine

## 2019-12-25 ENCOUNTER — Other Ambulatory Visit: Payer: Self-pay

## 2019-12-25 VITALS — BP 126/82 | HR 86 | Temp 97.5°F | Ht 62.0 in | Wt 151.0 lb

## 2019-12-25 DIAGNOSIS — K219 Gastro-esophageal reflux disease without esophagitis: Secondary | ICD-10-CM

## 2019-12-25 DIAGNOSIS — Z818 Family history of other mental and behavioral disorders: Secondary | ICD-10-CM | POA: Insufficient documentation

## 2019-12-25 DIAGNOSIS — E119 Type 2 diabetes mellitus without complications: Secondary | ICD-10-CM

## 2019-12-25 DIAGNOSIS — E782 Mixed hyperlipidemia: Secondary | ICD-10-CM | POA: Diagnosis not present

## 2019-12-25 NOTE — Patient Instructions (Addendum)
Influenza vaccine can be given here when we get high dose in stock.  Call in 2 weeks.    Shingrix series for shingles--you may get at [facility].  One shot, then the second will be 2-6 months later.    Covid booster will be due in Nov.

## 2019-12-25 NOTE — Progress Notes (Signed)
Location:  [facility] clinic Provider:  Jamilya Sarrazin L. Renato Gails, D.O., C.M.D.  Goals of Care:  Advanced Directives 12/25/2019  Does Patient Have a Medical Advance Directive? Yes  Type of Advance Directive Healthcare Power of Attorney  Does patient want to make changes to medical advance directive? No - Patient declined  Copy of Healthcare Power of Attorney in Chart? -  Would patient like information on creating a medical advance directive? -     Chief Complaint  Patient presents with  . Medical Management of Chronic Issues    4 month follow up   . Health Maintenance    Urine  Microalbumin, Influenza vaccine is not in stock   . Acute Visit    ears feel stomped up    HPI: Patient is a 86 y.o. female seen today for medical management of chronic diseases.    Left ear builds up wax and she requests that I check her ears.  Hyperlipidemia:  She opted not to take the nexlizet b/c she was fearful it could cause gout--she's never had gout and her parents didn't either.  Is taking crestor qod and zetia and she's doing ok with those--LDL 78.    Sugar average is up to 6.3 after taking prednisone for hives.   She's taking zinc/thiamine/chromium/cinnamon/carbocytic acid supplement to help her sugar.    Reviewed her vaccination needs.    Probiotics help her indigestion so much.    Her sister has dementia, her mother did and several family members have.  She says her family members live a long time.  Pt forgot the address she was givne on the [facility] screen.    Past Medical History:  Diagnosis Date  . Allergic rhinitis   . Atypical chest pain 09/10/2015  . Bronchitis, acute   . Dyspnea on exertion   . Exudative pleural effusion 5/09    sterile, benign, inflammatory  . GERD (gastroesophageal reflux disease) 09/10/2015  . Systolic murmur 09/10/2015    Past Surgical History:  Procedure Laterality Date  . APPENDECTOMY    . CATARACT EXTRACTION W/ INTRAOCULAR LENS IMPLANT Bilateral 12/2018  . OOPHORECTOMY  Right   . thoracentesis left  5/09    Allergies  Allergen Reactions  . Statins Other (See Comments)    Myalgias with crestor even when taken only a few times per week  . Erythromycin Itching    SEVERE ITCHING  . Other Other (See Comments)    NO "ACIDIC" FOODS!! They cause GI upset  . Tomato Other (See Comments)    Causes GI upset    Outpatient Encounter Medications as of 12/25/2019  Medication Sig  . Bempedoic Acid-Ezetimibe (NEXLIZET) 180-10 MG TABS Take 1 tablet by mouth daily.  . Calcium Carb-Cholecalciferol (CALCIUM + D3 PO) Take 1 tablet by mouth daily.  . Docosahexaenoic Acid (OCEAN BLUE OPTIMAL HEALTH PO) Take by mouth. Take one every other day.... OTC Striction D a natural way to support health blood sugar  . ezetimibe (ZETIA) 10 MG tablet Take 10 mg by mouth daily. 5 mg 1/2 tablet daily. May take every other day  . Glucosamine-Chondroit-Vit C-Mn (GLUCOSAMINE 1500 COMPLEX PO) Take 1 tablet by mouth daily as needed (for joint pain).   . Glycerin-Hypromellose-PEG 400 (DRY EYE RELIEF DROPS OP) Apply to eye.  . Misc Natural Products (FOCUSED MIND PO) Take by mouth as needed.  . Multiple Vitamins-Minerals (HAIR/SKIN/NAILS) TABS Take by mouth as needed.  . Multiple Vitamins-Minerals (MULTIVITAMIN WOMEN 50+) TABS Take 1 tablet by mouth daily.  Marland Kitchen  Omega-3 Fatty Acids (FISH OIL) 1000 MG CAPS Take 1 capsule by mouth daily.   . Probiotic Product (UP4 PROBIOTICS PO) Take by mouth daily. 1 or two as needed  . rosuvastatin (CRESTOR) 5 MG tablet Take 5 mg by mouth daily. May take every other day  . [DISCONTINUED] Soft Lens Products (RA SALINE SOLUTION) SOLN by Does not apply route.   No facility-administered encounter medications on file as of 12/25/2019.    Review of Systems:  Review of Systems  Constitutional: Negative for chills, fever and malaise/fatigue.  HENT:       Ear pressure  Eyes: Negative for blurred vision.  Respiratory: Negative for cough and shortness of breath.    Cardiovascular: Negative for chest pain, palpitations and leg swelling.  Gastrointestinal: Positive for heartburn. Negative for abdominal pain, blood in stool, constipation, diarrhea and melena.  Genitourinary: Negative for dysuria.  Musculoskeletal: Negative for falls and joint pain.  Skin: Negative for rash.  Neurological: Negative for dizziness and loss of consciousness.  Endo/Heme/Allergies: Does not bruise/bleed easily.  Psychiatric/Behavioral: Negative for depression and memory loss. The patient is not nervous/anxious and does not have insomnia.     Health Maintenance  Topic Date Due  . URINE MICROALBUMIN  Never done  . INFLUENZA VACCINE  12/03/2019  . TETANUS/TDAP  11/15/2029  . DEXA SCAN  Completed  . COVID-19 Vaccine  Completed  . PNA vac Low Risk Adult  Completed    Physical Exam: Vitals:   12/25/19 1457  BP: 126/82  Pulse: 86  Temp: (!) 97.5 F (36.4 C)  TempSrc: Temporal  SpO2: 94%  Weight: 151 lb (68.5 kg)  Height: 5\' 2"  (1.575 m)   Body mass index is 27.62 kg/m. Physical Exam Vitals reviewed.  Constitutional:      General: She is not in acute distress.    Appearance: Normal appearance. She is not toxic-appearing.  HENT:     Head: Normocephalic and atraumatic.     Right Ear: Tympanic membrane, ear canal and external ear normal.     Left Ear: Tympanic membrane, ear canal and external ear normal.     Ears:     Comments: Small amount of cerumen in each ear, but no impaction and TMs normal Eyes:     Extraocular Movements: Extraocular movements intact.     Pupils: Pupils are equal, round, and reactive to light.  Cardiovascular:     Rate and Rhythm: Normal rate and regular rhythm.     Pulses: Normal pulses.     Heart sounds: Normal heart sounds. No murmur heard.   Pulmonary:     Effort: Pulmonary effort is normal.     Breath sounds: Normal breath sounds. No wheezing, rhonchi or rales.  Abdominal:     General: Bowel sounds are normal.   Musculoskeletal:        General: Normal range of motion.     Right lower leg: No edema.     Left lower leg: No edema.  Neurological:     General: No focal deficit present.     Mental Status: She is alert and oriented to person, place, and time.  Psychiatric:        Mood and Affect: Mood normal.        Behavior: Behavior normal.        Thought Content: Thought content normal.        Judgment: Judgment normal.     Labs reviewed: Basic Metabolic Panel: Recent Labs    02/20/19 1045 08/21/19  1046 12/21/19 1026  NA 142 140 140  K 4.3 4.0 4.2  CL 106 105 104  CO2 28 26 29   GLUCOSE 120* 128* 131*  BUN 14 18 18   CREATININE 0.76 0.81 0.84  CALCIUM 9.5 9.6 9.8   Liver Function Tests: Recent Labs    08/21/19 1046  AST 15  ALT 13  BILITOT 0.5  PROT 6.5   No results for input(s): LIPASE, AMYLASE in the last 8760 hours. No results for input(s): AMMONIA in the last 8760 hours. CBC: Recent Labs    08/21/19 1046  WBC 7.2  NEUTROABS 4,630  HGB 15.5  HCT 46.6*  MCV 88.9  PLT 287   Lipid Panel: Recent Labs    02/20/19 1045 08/21/19 1046 12/21/19 1026  CHOL 162 162 149  HDL 43* 46* 51  LDLCALC 93 92 78  TRIG 154* 143 121  CHOLHDL 3.8 3.5 2.9   Lab Results  Component Value Date   HGBA1C 6.3 (H) 12/21/2019     Assessment/Plan 1. Controlled type 2 diabetes mellitus without complication, without long-term current use of insulin (HCC) -improved from last time, cont to work on diet and exercise - Microalbumin / creatinine urine ratio - CBC with Differential/Platelet; Future - BASIC METABOLIC PANEL WITH GFR; Future - Hemoglobin A1c; Future  2. Mixed hyperlipidemia -cont zetia and qod crestor--cannot tolerate nightly -cholesterol improved considerably from last time - BASIC METABOLIC PANEL WITH GFR; Future - Lipid panel; Future  3. Gastroesophageal reflux disease, unspecified whether esophagitis present -cont probiotic, avoid triggers and follow conservative  therapy  4. Family history of dementia -notable, she is still doing well cognitively, monitor carefully, should have MMSE with annual exam  Labs/tests ordered:   Lab Orders     Microalbumin / creatinine urine ratio     CBC with Differential/Platelet     BASIC METABOLIC PANEL WITH GFR     Hemoglobin A1c     Lipid panel  Next appt:  6 mos with fasting labs before   Citlally Captain L. Joshuajames Moehring, D.O. Geriatrics 12/23/19 [facility] [address] [place], [address] [place] Cell Phone (Mon-Fri 8am-5pm):  [phone] On Call:  [phone] & follow prompts after 5pm & weekends Office Phone:  [phone] Office Fax:  [phone]

## 2019-12-26 LAB — MICROALBUMIN / CREATININE URINE RATIO
Creatinine, Urine: 169 mg/dL (ref 20–275)
Microalb Creat Ratio: 6 mcg/mg creat (ref <30)
Microalb, Ur: 1 mg/dL

## 2019-12-26 NOTE — Progress Notes (Signed)
No significant protein in urine.

## 2020-02-23 ENCOUNTER — Other Ambulatory Visit: Payer: Self-pay

## 2020-02-23 ENCOUNTER — Ambulatory Visit
Admission: RE | Admit: 2020-02-23 | Discharge: 2020-02-23 | Disposition: A | Discharge status: Home / Self Care | Payer: Medicare PPO | Source: Ambulatory Visit | Attending: Nurse Practitioner | Admitting: Nurse Practitioner

## 2020-02-23 DIAGNOSIS — Z780 Asymptomatic menopausal state: Secondary | ICD-10-CM | POA: Diagnosis not present

## 2020-02-23 DIAGNOSIS — E2839 Other primary ovarian failure: Secondary | ICD-10-CM

## 2020-02-26 NOTE — Progress Notes (Signed)
Good news--bone density remains normal.  :)

## 2020-03-25 ENCOUNTER — Other Ambulatory Visit: Payer: Self-pay | Admitting: Internal Medicine

## 2020-03-25 DIAGNOSIS — E785 Hyperlipidemia, unspecified: Secondary | ICD-10-CM

## 2020-03-25 NOTE — Telephone Encounter (Signed)
Patient is requesting refill on medication "Ezetimibe" and "Rosuvastatin". Last refill for "Ezetimibe" was 11/09/2019 and 'Rosuvastatin" was 11/09/2019. Neither medication has additional refills. I tried to send medication but Allergy/Contraindiacations came up. Medications pend and sent to PCP Renato Gails, Tiffany L, DO . Please Advise.

## 2020-06-24 ENCOUNTER — Encounter: Payer: Self-pay | Admitting: Internal Medicine

## 2020-06-24 ENCOUNTER — Other Ambulatory Visit: Payer: Self-pay

## 2020-06-24 ENCOUNTER — Other Ambulatory Visit: Payer: Medicare PPO

## 2020-06-24 DIAGNOSIS — E119 Type 2 diabetes mellitus without complications: Secondary | ICD-10-CM | POA: Diagnosis not present

## 2020-06-24 DIAGNOSIS — E782 Mixed hyperlipidemia: Secondary | ICD-10-CM

## 2020-06-25 LAB — BASIC METABOLIC PANEL WITH GFR
BUN/Creatinine Ratio: 17 (calc) (ref 6–22)
BUN: 16 mg/dL (ref 7–25)
CO2: 28 mmol/L (ref 20–32)
Calcium: 9.8 mg/dL (ref 8.6–10.4)
Chloride: 104 mmol/L (ref 98–110)
Creat: 0.94 mg/dL — ABNORMAL HIGH (ref 0.60–0.88)
GFR, Est African American: 65 mL/min/{1.73_m2} (ref >=60)
GFR, Est Non African American: 56 mL/min/{1.73_m2} — ABNORMAL LOW (ref >=60)
Glucose, Bld: 132 mg/dL — ABNORMAL HIGH (ref 65–99)
Potassium: 4.8 mmol/L (ref 3.5–5.3)
Sodium: 142 mmol/L (ref 135–146)

## 2020-06-25 LAB — CBC WITH DIFFERENTIAL/PLATELET
Absolute Monocytes: 568 cells/uL (ref 200–950)
Basophils Absolute: 57 cells/uL (ref 0–200)
Basophils Relative: 0.8 %
Eosinophils Absolute: 199 cells/uL (ref 15–500)
Eosinophils Relative: 2.8 %
HCT: 47.1 % — ABNORMAL HIGH (ref 35.0–45.0)
Hemoglobin: 16.1 g/dL — ABNORMAL HIGH (ref 11.7–15.5)
Lymphs Abs: 1910 cells/uL (ref 850–3900)
MCH: 30.5 pg (ref 27.0–33.0)
MCHC: 34.2 g/dL (ref 32.0–36.0)
MCV: 89.2 fL (ref 80.0–100.0)
MPV: 10.5 fL (ref 7.5–12.5)
Monocytes Relative: 8 %
Neutro Abs: 4367 cells/uL (ref 1500–7800)
Neutrophils Relative %: 61.5 %
Platelets: 294 10*3/uL (ref 140–400)
RBC: 5.28 10*6/uL — ABNORMAL HIGH (ref 3.80–5.10)
RDW: 12.1 % (ref 11.0–15.0)
Total Lymphocyte: 26.9 %
WBC: 7.1 10*3/uL (ref 3.8–10.8)

## 2020-06-25 LAB — HEMOGLOBIN A1C
Hgb A1c MFr Bld: 6.7 % of total Hgb — ABNORMAL HIGH (ref <5.7)
Mean Plasma Glucose: 146 mg/dL
eAG (mmol/L): 8.1 mmol/L

## 2020-06-25 LAB — LIPID PANEL
Cholesterol: 171 mg/dL (ref <200)
HDL: 52 mg/dL (ref >=50)
LDL Cholesterol (Calc): 94 mg/dL (calc)
Non-HDL Cholesterol (Calc): 119 mg/dL (calc) (ref <130)
Total CHOL/HDL Ratio: 3.3 (calc) (ref <5.0)
Triglycerides: 149 mg/dL (ref <150)

## 2020-06-25 NOTE — Progress Notes (Signed)
Bad cholesterol trended up and so did sugar average. Kidney functions lightly down--must hydrate with water. Blood counts look higher too so this may be due to inadequate hydration at time of labs

## 2020-06-27 ENCOUNTER — Encounter: Payer: Self-pay | Admitting: Internal Medicine

## 2020-06-27 ENCOUNTER — Other Ambulatory Visit: Payer: Self-pay

## 2020-06-27 ENCOUNTER — Ambulatory Visit (INDEPENDENT_AMBULATORY_CARE_PROVIDER_SITE_OTHER): Payer: Medicare PPO | Admitting: Internal Medicine

## 2020-06-27 VITALS — BP 118/80 | HR 79 | Temp 97.5°F | Ht 63.0 in | Wt 158.8 lb

## 2020-06-27 DIAGNOSIS — E782 Mixed hyperlipidemia: Secondary | ICD-10-CM

## 2020-06-27 DIAGNOSIS — R635 Abnormal weight gain: Secondary | ICD-10-CM

## 2020-06-27 DIAGNOSIS — E119 Type 2 diabetes mellitus without complications: Secondary | ICD-10-CM | POA: Diagnosis not present

## 2020-06-27 DIAGNOSIS — D751 Secondary polycythemia: Secondary | ICD-10-CM

## 2020-06-27 MED ORDER — EZETIMIBE 10 MG PO TABS
10.0000 mg | ORAL_TABLET | Freq: Every day | ORAL | 3 refills | Status: DC
Start: 2020-06-27 — End: 2021-07-03

## 2020-06-27 NOTE — Progress Notes (Signed)
Location:  [facility] clinic Provider:  Jaren Vanetten L. Renato Gails, D.O., C.M.D.  Goals of Care:  Advanced Directives 12/25/2019  Does Patient Have a Medical Advance Directive? Yes  Type of Advance Directive Healthcare Power of Attorney  Does patient want to make changes to medical advance directive? No - Patient declined  Copy of Healthcare Power of Attorney in Chart? -  Would patient like information on creating a medical advance directive? -     Chief Complaint  Patient presents with  . Medical Management of Chronic Issues    6 month follow-up and discuss labs. Discuss need for eye exam. Foot exam today. Patient c/o sleep disturbances.     HPI: Patient is a 87 y.o. female seen today for medical management of chronic diseases.    She's had some weight gain and labs have gone up with it.  Bad cholesterol trended up some and sugar average went up again.  Says she can't keep it down.  She had hardly been out to walk in the winter.  Has been taking crestor 10mg  every 3 days and zetia every 3 days.    She normally drinks a lot of water.    Does get her diabetic eye exam annually.    She had all of her covid vaccines. She got her shingrix shots.  She had her flu shot also.  She went and got her tdap at [facility].    Says she does not sleep the best.  She dreams a lot, she thinks.    She worries b/c her sister has dementia and she's concerned about her own memory.  Takes a focus vitamin for her memory.    Past Medical History:  Diagnosis Date  . Allergic rhinitis   . Atypical chest pain 09/10/2015  . Bronchitis, acute   . Dyspnea on exertion   . Exudative pleural effusion 5/09    sterile, benign, inflammatory  . GERD (gastroesophageal reflux disease) 09/10/2015  . Systolic murmur 09/10/2015    Past Surgical History:  Procedure Laterality Date  . APPENDECTOMY    . CATARACT EXTRACTION W/ INTRAOCULAR LENS IMPLANT Bilateral 12/2018  . OOPHORECTOMY Right   . thoracentesis left  5/09    Allergies   Allergen Reactions  . Statins Other (See Comments)    Myalgias with crestor even when taken only a few times per week  . Dairycare [Lactase-Lactobacillus]     Stomach upset   . Erythromycin Itching    SEVERE ITCHING  . Other Other (See Comments)    NO "ACIDIC" FOODS!! They cause GI upset  . Tomato Other (See Comments)    Causes GI upset    Outpatient Encounter Medications as of 06/27/2020  Medication Sig  . Calcium Carb-Cholecalciferol (CALCIUM + D3 PO) Take 1 tablet by mouth daily.  . Docosahexaenoic Acid (OCEAN BLUE OPTIMAL HEALTH PO) Take one every other day.... OTC Striction D a natural way to support health blood sugar  . ezetimibe (ZETIA) 10 MG tablet Take 10 mg by mouth as directed. Every 3 days  . Glucosamine-Chondroit-Vit C-Mn (GLUCOSAMINE 1500 COMPLEX PO) Take 1 tablet by mouth daily as needed (for joint pain).  . Glycerin-Hypromellose-PEG 400 (DRY EYE RELIEF DROPS OP) Apply to eye.  . Misc Natural Products (FOCUSED MIND PO) Take by mouth as needed.  . Multiple Vitamins-Minerals (HAIR/SKIN/NAILS) TABS Take by mouth as needed.  . Multiple Vitamins-Minerals (MULTIVITAMIN WOMEN 50+) TABS Take 1 tablet by mouth daily.  . Omega-3 Fatty Acids (FISH OIL) 1000 MG CAPS Take  1 capsule by mouth daily.  . Probiotic Product (UP4 PROBIOTICS PO) Take 1 tablet by mouth daily.  . rosuvastatin (CRESTOR) 5 MG tablet Take 5 mg by mouth as directed. Every 3 days due to muscle cramps  . [DISCONTINUED] ezetimibe (ZETIA) 10 MG tablet TAKE 1 TABLET BY MOUTH EVERY OTHER DAY  . [DISCONTINUED] rosuvastatin (CRESTOR) 5 MG tablet TAKE 1 TABLET BY MOUTH EVERY DAY   No facility-administered encounter medications on file as of 06/27/2020.    Review of Systems:  Review of Systems  Constitutional: Negative for chills and fever.  HENT: Negative for congestion and sore throat.   Respiratory: Negative for cough and shortness of breath.   Cardiovascular: Negative for chest pain, palpitations and leg  swelling.  Gastrointestinal: Negative for abdominal pain, blood in stool, constipation, diarrhea and melena.  Genitourinary: Negative for dysuria.  Musculoskeletal: Negative for falls and joint pain.  Skin: Negative for itching and rash.  Neurological: Negative for dizziness, tingling, sensory change and loss of consciousness.  Endo/Heme/Allergies: Bruises/bleeds easily.  Psychiatric/Behavioral: Positive for memory loss. Negative for depression. The patient is not nervous/anxious and does not have insomnia.        Dreams a lot    Health Maintenance  Topic Date Due  . FOOT EXAM  Never done  . OPHTHALMOLOGY EXAM  Never done  . HEMOGLOBIN A1C  12/22/2020  . URINE MICROALBUMIN  12/24/2020  . TETANUS/TDAP  11/15/2029  . INFLUENZA VACCINE  Completed  . DEXA SCAN  Completed  . COVID-19 Vaccine  Completed  . PNA vac Low Risk Adult  Completed    Physical Exam: Vitals:   06/27/20 1453  BP: 118/80  Pulse: 79  Temp: (!) 97.5 F (36.4 C)  TempSrc: Temporal  SpO2: 95%  Weight: 158 lb 12.8 oz (72 kg)  Height: 5\' 3"  (1.6 m)   Body mass index is 28.13 kg/m. Physical Exam Vitals reviewed.  Constitutional:      General: She is not in acute distress.    Appearance: Normal appearance. She is not toxic-appearing.  HENT:     Head: Normocephalic and atraumatic.  Cardiovascular:     Rate and Rhythm: Normal rate and regular rhythm.     Pulses: Normal pulses.     Heart sounds: Normal heart sounds.  Pulmonary:     Effort: Pulmonary effort is normal.     Breath sounds: Normal breath sounds. No wheezing, rhonchi or rales.  Abdominal:     General: Bowel sounds are normal.  Musculoskeletal:        General: Normal range of motion.     Right lower leg: No edema.     Left lower leg: No edema.     Comments: Diabetic foot exam was performed with the following findings:   No deformities, ulcerations, or other skin breakdown Normal sensation of 10g monofilament Intact posterior tibialis and  dorsalis pedis pulses     Neurological:     General: No focal deficit present.     Mental Status: She is alert and oriented to person, place, and time.     Motor: No weakness.     Gait: Gait normal.  Psychiatric:        Mood and Affect: Mood normal.        Behavior: Behavior normal.        Thought Content: Thought content normal.        Judgment: Judgment normal.     Labs reviewed: Basic Metabolic Panel: Recent Labs  08/21/19 1046 12/21/19 1026 06/24/20 1023  NA 140 140 142  K 4.0 4.2 4.8  CL 105 104 104  CO2 26 29 28   GLUCOSE 128* 131* 132*  BUN 18 18 16   CREATININE 0.81 0.84 0.94*  CALCIUM 9.6 9.8 9.8   Liver Function Tests: Recent Labs    08/21/19 1046  AST 15  ALT 13  BILITOT 0.5  PROT 6.5   No results for input(s): LIPASE, AMYLASE in the last 8760 hours. No results for input(s): AMMONIA in the last 8760 hours. CBC: Recent Labs    08/21/19 1046 06/24/20 1023  WBC 7.2 7.1  NEUTROABS 4,630 4,367  HGB 15.5 16.1*  HCT 46.6* 47.1*  MCV 88.9 89.2  PLT 287 294   Lipid Panel: Recent Labs    08/21/19 1046 12/21/19 1026 06/24/20 1023  CHOL 162 149 171  HDL 46* 51 52  LDLCALC 92 78 94  TRIG 143 121 149  CHOLHDL 3.5 2.9 3.3   Lab Results  Component Value Date   HGBA1C 6.7 (H) 06/24/2020    Procedures since last visit: No results found.  Assessment/Plan 1. Controlled type 2 diabetes mellitus without complication, without long-term current use of insulin (HCC) - control seems worse for her in winter, educated on diet and exercise  -no med changes -f/u in 6 mos - Hemoglobin A1c; Future  2. Mixed hyperlipidemia - cont zetia, but take daily (thought she was but she'd reduced it to take like the crestor), cont crestor 10mg  three days per week DUE to muscle cramps - CBC with Differential/Platelet; Future - COMPLETE METABOLIC PANEL WITH GFR; Future - Lipid panel; Future  3. Weight gain -over winter when less active, not walking, going to get  back on track  4. Polycythemia - has had mild for some time -monitor this and if trends upward, would refer to hematology for further eval - CBC with Differential/Platelet; Future  Labs/tests ordered:   Lab Orders     CBC with Differential/Platelet     COMPLETE METABOLIC PANEL WITH GFR     Lipid panel     Hemoglobin A1c   Next appt:  6 mos with fasting labs before  Declan Adamson L. Kenna Seward, D.O. Geriatrics 07-20-1983 [facility] [address], [facility] [address] Cell Phone (Mon-Fri 8am-5pm):  [phone] On Call:  [phone] & follow prompts after 5pm & weekends Office Phone:  [phone] Office Fax:  [phone]

## 2020-06-27 NOTE — Patient Instructions (Addendum)
Take your zetia 10mg  every day.  Take the crestor 10mg  every 3 days.    Please get the dates for your shingles vaccines form [facility].

## 2020-09-24 ENCOUNTER — Other Ambulatory Visit: Payer: Self-pay

## 2020-09-24 ENCOUNTER — Ambulatory Visit: Payer: Medicare PPO | Admitting: Orthopedic Surgery

## 2020-09-24 ENCOUNTER — Encounter: Payer: Self-pay | Admitting: Orthopedic Surgery

## 2020-09-24 VITALS — BP 128/80 | HR 92 | Temp 97.5°F | Ht 63.0 in | Wt 152.0 lb

## 2020-09-24 DIAGNOSIS — H6121 Impacted cerumen, right ear: Secondary | ICD-10-CM

## 2020-09-24 DIAGNOSIS — R21 Rash and other nonspecific skin eruption: Secondary | ICD-10-CM

## 2020-09-24 MED ORDER — CETIRIZINE HCL 10 MG PO TABS: 10.0000 mg | ORAL_TABLET | Freq: Every day | ORAL | 11 refills | Status: DC

## 2020-09-24 MED ORDER — DEBROX 6.5 % OT SOLN: 5.0000 [drp] | Freq: Two times a day (BID) | OTIC | 0 refills | Status: DC

## 2020-09-24 MED ORDER — HYDROCORTISONE 1 % EX OINT: 1.0000 "application " | TOPICAL_OINTMENT | Freq: Two times a day (BID) | CUTANEOUS | 0 refills | Status: DC

## 2020-09-24 NOTE — Patient Instructions (Signed)
Please use Debrox ear drops for 7 days- apply 5 drops twice daily.  Apply hydrocortisone cream to rash twice daily for 1 week Use zyrtec allergy medication x 14 days- one pill every night   Earwax Buildup, Adult The ears produce a substance called earwax that helps keep bacteria out of the ear and protects the skin in the ear canal. Occasionally, earwax can build up in the ear and cause discomfort or hearing loss. What are the causes? This condition is caused by a buildup of earwax. Ear canals are self-cleaning. Ear wax is made in the outer part of the ear canal and generally falls out in small amounts over time. When the self-cleaning mechanism is not working, earwax builds up and can cause decreased hearing and discomfort. Attempting to clean ears with cotton swabs can push the earwax deep into the ear canal and cause decreased hearing and pain. What increases the risk? This condition is more likely to develop in people who:  Clean their ears often with cotton swabs.  Pick at their ears.  Use earplugs or in-ear headphones often, or wear hearing aids. The following factors may also make you more likely to develop this condition:  Being female.  Being of older age.  Naturally producing more earwax.  Having narrow ear canals.  Having earwax that is overly thick or sticky.  Having excess hair in the ear canal.  Having eczema.  Being dehydrated. What are the signs or symptoms? Symptoms of this condition include:  Reduced or muffled hearing.  A feeling of fullness in the ear or feeling that the ear is plugged.  Fluid coming from the ear.  Ear pain or an itchy ear.  Ringing in the ear.  Coughing.  Balance problems.  An obvious piece of earwax that can be seen inside the ear canal. How is this diagnosed? This condition may be diagnosed based on:  Your symptoms.  Your medical history.  An ear exam. During the exam, your health care provider will look into your ear  with an instrument called an otoscope. You may have tests, including a hearing test. How is this treated? This condition may be treated by:  Using ear drops to soften the earwax.  Having the earwax removed by a health care provider. The health care provider may: ? Flush the ear with water. ? Use an instrument that has a loop on the end (curette). ? Use a suction device.  Having surgery to remove the wax buildup. This may be done in severe cases. Follow these instructions at home:  Take over-the-counter and prescription medicines only as told by your health care provider.  Do not put any objects, including cotton swabs, into your ear. You can clean the opening of your ear canal with a washcloth or facial tissue.  Follow instructions from your health care provider about cleaning your ears. Do not overclean your ears.  Drink enough fluid to keep your urine pale yellow. This will help to thin the earwax.  Keep all follow-up visits as told. If earwax builds up in your ears often or if you use hearing aids, consider seeing your health care provider for routine, preventive ear cleanings. Ask your health care provider how often you should schedule your cleanings.  If you have hearing aids, clean them according to instructions from the manufacturer and your health care provider.   Contact a health care provider if:  You have ear pain.  You develop a fever.  You have pus or  other fluid coming from your ear.  You have hearing loss.  You have ringing in your ears that does not go away.  You feel like the room is spinning (vertigo).  Your symptoms do not improve with treatment. Get help right away if:  You have bleeding from the affected ear.  You have severe ear pain. Summary  Earwax can build up in the ear and cause discomfort or hearing loss.  The most common symptoms of this condition include reduced or muffled hearing, a feeling of fullness in the ear, or feeling that the  ear is plugged.  This condition may be diagnosed based on your symptoms, your medical history, and an ear exam.  This condition may be treated by using ear drops to soften the earwax or by having the earwax removed by a health care provider.  Do not put any objects, including cotton swabs, into your ear. You can clean the opening of your ear canal with a washcloth or facial tissue. This information is not intended to replace advice given to you by your health care provider. Make sure you discuss any questions you have with your health care provider. Document Revised: 08/08/2019 Document Reviewed: 08/08/2019 Elsevier Patient Education  2021 [employer].

## 2020-09-24 NOTE — Progress Notes (Signed)
Careteam: Patient Care Team: Octavia Heir, NP as PCP - General (Adult Health Nurse Practitioner) [name], MD as Consulting Physician (Ophthalmology)  Seen by: [name], AGNP-C  PLACE OF SERVICE:  [facility]  Advanced Directive information Does Patient Have a Medical Advance Directive?: Yes, Type of Advance Directive: Healthcare Power of Attorney, Does patient want to make changes to medical advance directive?: No - Patient declined  Allergies  Allergen Reactions  . Statins Other (See Comments)    Myalgias with crestor even when taken only a few times per week  . Dairycare [Lactase-Lactobacillus]     Stomach upset   . Erythromycin Itching    SEVERE ITCHING  . Other Other (See Comments)    NO "ACIDIC" FOODS!! They cause GI upset  . Tomato Other (See Comments)    Causes GI upset    Chief Complaint  Patient presents with  . Acute Visit    Possible allergic reaction to cedar trees in yard x 2 weeks ago, patient took benadryl.  Patient also c/o right ear fullness.      HPI: Patient is a 87 y.o. female seen today for acute visit for rash and right ear pain.   She came in contact with cedar trees about 2 weeks ago and developed a rash on her forearms. Itching has subsided. She describes rash as small red bumps, no drainage. She has been taking 1-2 benadryl daily for the past 2 weeks. Denies anaphylaxis reaction.   Right ear pain began a few days ago. She states" my ear feels full with fluid." She denies any changes to ear habits. Denies changes to hearing. History of wax buildup in past. Does not use Q-tips.     Review of Systems:  Review of Systems  Constitutional: Negative for chills and fever.  HENT: Negative for ear discharge, ear pain, hearing loss and tinnitus.        Ear fullness  Respiratory: Negative for cough, shortness of breath and wheezing.   Cardiovascular: Negative for chest pain and leg swelling.  Skin: Positive for rash.  Psychiatric/Behavioral:  Negative for depression. The patient is not nervous/anxious.     Past Medical History:  Diagnosis Date  . Allergic rhinitis   . Atypical chest pain 09/10/2015  . Bronchitis, acute   . Dyspnea on exertion   . Exudative pleural effusion 5/09    sterile, benign, inflammatory  . GERD (gastroesophageal reflux disease) 09/10/2015  . Systolic murmur 09/10/2015   Past Surgical History:  Procedure Laterality Date  . APPENDECTOMY    . CATARACT EXTRACTION W/ INTRAOCULAR LENS IMPLANT Bilateral 12/2018  . OOPHORECTOMY Right   . thoracentesis left  5/09   Social History:   reports that she has never smoked. She has never used smokeless tobacco. She reports that she does not drink alcohol and does not use drugs.  Family History  Problem Relation Age of Onset  . Allergies Mother   . Heart disease Mother 40  . Cancer Mother   . Dementia Mother   . Heart disease Father 47  . Thyroid cancer Sister   . Dementia Sister 14    Medications: Patient's Medications  New Prescriptions   No medications on file  Previous Medications   CALCIUM CARB-CHOLECALCIFEROL (CALCIUM + D3 PO)    Take 1 tablet by mouth daily.   DOCOSAHEXAENOIC ACID (OCEAN BLUE OPTIMAL HEALTH PO)    Take one every other day.... OTC Striction D a natural way to support health blood sugar  EZETIMIBE (ZETIA) 10 MG TABLET    Take 1 tablet (10 mg total) by mouth daily.   GLUCOSAMINE-CHONDROIT-VIT C-MN (GLUCOSAMINE 1500 COMPLEX PO)    Take 1 tablet by mouth daily as needed (for joint pain).   GLYCERIN-HYPROMELLOSE-PEG 400 (DRY EYE RELIEF DROPS OP)    Apply to eye.   MISC NATURAL PRODUCTS (FOCUSED MIND PO)    Take by mouth as needed.   MULTIPLE VITAMINS-MINERALS (HAIR/SKIN/NAILS) TABS    Take by mouth as needed.   MULTIPLE VITAMINS-MINERALS (MULTIVITAMIN WOMEN 50+) TABS    Take 1 tablet by mouth daily.   OMEGA-3 FATTY ACIDS (FISH OIL) 1000 MG CAPS    Take 1 capsule by mouth daily.   PROBIOTIC PRODUCT (UP4 PROBIOTICS PO)    Take 1 tablet by  mouth daily.   ROSUVASTATIN (CRESTOR) 5 MG TABLET    Take 10 mg by mouth as directed. Every 3 days due to muscle cramps  Modified Medications   No medications on file  Discontinued Medications   No medications on file    Physical Exam:  Vitals:   09/24/20 1351  BP: 128/80  Pulse: 92  Temp: (!) 97.5 F (36.4 C)  TempSrc: Temporal  SpO2: 96%  Weight: 152 lb (68.9 kg)  Height: 5\' 3"  (1.6 m)   Body mass index is 26.93 kg/m. Wt Readings from Last 3 Encounters:  09/24/20 152 lb (68.9 kg)  06/27/20 158 lb 12.8 oz (72 kg)  12/25/19 151 lb (68.5 kg)    Physical Exam Vitals reviewed.  Constitutional:      General: She is not in acute distress. HENT:     Head: Normocephalic.     Right Ear: There is impacted cerumen.     Nose: Nose normal.     Mouth/Throat:     Mouth: Mucous membranes are moist.     Pharynx: No posterior oropharyngeal erythema.  Eyes:     General:        Right eye: No discharge.        Left eye: No discharge.  Cardiovascular:     Rate and Rhythm: Normal rate and regular rhythm.     Pulses: Normal pulses.     Heart sounds: Normal heart sounds. No murmur heard.   Pulmonary:     Effort: Pulmonary effort is normal. No respiratory distress.     Breath sounds: Normal breath sounds. No wheezing.  Musculoskeletal:     Cervical back: Normal range of motion.  Lymphadenopathy:     Cervical: No cervical adenopathy.  Skin:    General: Skin is warm and dry.     Capillary Refill: Capillary refill takes less than 2 seconds.     Comments: Maculopapular rash to bilateral forearms. No drainage or scratch marks. Almost healed.   Neurological:     General: No focal deficit present.     Mental Status: She is alert and oriented to person, place, and time.  Psychiatric:        Mood and Affect: Mood normal.        Behavior: Behavior normal.     Labs reviewed: Basic Metabolic Panel: Recent Labs    12/21/19 1026 06/24/20 1023  NA 140 142  K 4.2 4.8  CL 104 104   CO2 29 28  GLUCOSE 131* 132*  BUN 18 16  CREATININE 0.84 0.94*  CALCIUM 9.8 9.8   Liver Function Tests: No results for input(s): AST, ALT, ALKPHOS, BILITOT, PROT, ALBUMIN in the last 8760 hours. No results for  input(s): LIPASE, AMYLASE in the last 8760 hours. No results for input(s): AMMONIA in the last 8760 hours. CBC: Recent Labs    06/24/20 1023  WBC 7.1  NEUTROABS 4,367  HGB 16.1*  HCT 47.1*  MCV 89.2  PLT 294   Lipid Panel: Recent Labs    12/21/19 1026 06/24/20 1023  CHOL 149 171  HDL 51 52  LDLCALC 78 94  TRIG 121 149  CHOLHDL 2.9 3.3   TSH: No results for input(s): TSH in the last 8760 hours. A1C: Lab Results  Component Value Date   HGBA1C 6.7 (H) 06/24/2020     Assessment/Plan 1. Rash and nonspecific skin eruption - suspect due to contact with cedar trees - recommend stopping benadryl 1-2 times daily - recommend starting zyrtec - recommend hydrocortisone 1% cream for rash- bid x 7 days - cetirizine (ZYRTEC) 10 MG tablet; Take 1 tablet (10 mg total) by mouth at bedtime.  Dispense: 30 tablet; Refill: 11 - hydrocortisone 1 % ointment; Apply 1 application topically 2 (two) times daily.  Dispense: 30 g; Refill: 0  2. Impacted cerumen of right ear - cannot visualize TM - recommend Debrox drops x 7 days - advised to call office for ear lavage after using debrox drops for one week - carbamide peroxide (DEBROX) 6.5 % OTIC solution; Place 5 drops into the right ear 2 (two) times daily. For 7 days  Dispense: 15 mL; Refill: 0  Total time: 20 minutes. Greater than 50% of total time spent doing patient education and coordination of care regarding allergic reactions and ear wax prevention.   Next appt: 01/02/2021 [name], Juel Burrow  [facility] [phone]

## 2020-11-12 ENCOUNTER — Ambulatory Visit: Payer: Medicare PPO | Admitting: Nurse Practitioner

## 2020-11-13 ENCOUNTER — Ambulatory Visit (INDEPENDENT_AMBULATORY_CARE_PROVIDER_SITE_OTHER): Payer: Medicare PPO | Admitting: Nurse Practitioner

## 2020-11-13 ENCOUNTER — Encounter: Payer: Self-pay | Admitting: Nurse Practitioner

## 2020-11-13 ENCOUNTER — Other Ambulatory Visit: Payer: Self-pay

## 2020-11-13 ENCOUNTER — Telehealth: Payer: Self-pay

## 2020-11-13 DIAGNOSIS — Z0000 Encounter for general adult medical examination without abnormal findings: Secondary | ICD-10-CM | POA: Diagnosis not present

## 2020-11-13 NOTE — Progress Notes (Signed)
This service is provided via telemedicine  No vital signs collected/recorded due to the encounter was a telemedicine visit.   Location of patient (ex: home, work):  Home  Patient consents to a telephone visit:  Yes, see encounter dated 11/13/2020  Location of the provider (ex: office, home):  [facility]  Name of any referring provider:  [name], NP  Names of all persons participating in the telemedicine service and their role in the encounter:  Abbey Chatters, Nurse Practitioner, Elveria Royals, CMA, and patient.   Time spent on call:  20 minutes with medical assistant

## 2020-11-13 NOTE — Patient Instructions (Signed)
Sara Charles , Thank you for taking time to come for your Medicare Wellness Visit. I appreciate your ongoing commitment to your health goals. Please review the following plan we discussed and let me know if I can assist you in the future.   Screening recommendations/referrals: Colonoscopy aged out Mammogram aged out Bone Density up to date Recommended yearly ophthalmology/optometry visit for glaucoma screening and checkup Recommended yearly dental visit for hygiene and checkup  Vaccinations: Influenza vaccine up to date Pneumococcal vaccine up date  Tdap vaccine up to date Shingles vaccine- to bring record     Advanced directives: on file   Conditions/risks identified: advanced age.   Next appointment: 1 year for AWV    Preventive Care 85 Years and Older, Female Preventive care refers to lifestyle choices and visits with your health care provider that can promote health and wellness. What does preventive care include? A yearly physical exam. This is also called an annual well check. Dental exams once or twice a year. Routine eye exams. Ask your health care provider how often you should have your eyes checked. Personal lifestyle choices, including: Daily care of your teeth and gums. Regular physical activity. Eating a healthy diet. Avoiding tobacco and drug use. Limiting alcohol use. Practicing safe sex. Taking low-dose aspirin every day. Taking vitamin and mineral supplements as recommended by your health care provider. What happens during an annual well check? The services and screenings done by your health care provider during your annual well check will depend on your age, overall health, lifestyle risk factors, and family history of disease. Counseling  Your health care provider may ask you questions about your: Alcohol use. Tobacco use. Drug use. Emotional well-being. Home and relationship well-being. Sexual activity. Eating habits. History of falls. Memory and  ability to understand (cognition). Work and work Astronomer. Reproductive health. Screening  You may have the following tests or measurements: Height, weight, and BMI. Blood pressure. Lipid and cholesterol levels. These may be checked every 5 years, or more frequently if you are over 25 years old. Skin check. Lung cancer screening. You may have this screening every year starting at age 55 if you have a 30-pack-year history of smoking and currently smoke or have quit within the past 15 years. Fecal occult blood test (FOBT) of the stool. You may have this test every year starting at age 64. Flexible sigmoidoscopy or colonoscopy. You may have a sigmoidoscopy every 5 years or a colonoscopy every 10 years starting at age 58. Hepatitis C blood test. Hepatitis B blood test. Sexually transmitted disease (STD) testing. Diabetes screening. This is done by checking your blood sugar (glucose) after you have not eaten for a while (fasting). You may have this done every 1-3 years. Bone density scan. This is done to screen for osteoporosis. You may have this done starting at age 42. Mammogram. This may be done every 1-2 years. Talk to your health care provider about how often you should have regular mammograms. Talk with your health care provider about your test results, treatment options, and if necessary, the need for more tests. Vaccines  Your health care provider may recommend certain vaccines, such as: Influenza vaccine. This is recommended every year. Tetanus, diphtheria, and acellular pertussis (Tdap, Td) vaccine. You may need a Td booster every 10 years. Zoster vaccine. You may need this after age 49. Pneumococcal 13-valent conjugate (PCV13) vaccine. One dose is recommended after age 78. Pneumococcal polysaccharide (PPSV23) vaccine. One dose is recommended after age 16. Talk to  your health care provider about which screenings and vaccines you need and how often you need them. This information is  not intended to replace advice given to you by your health care provider. Make sure you discuss any questions you have with your health care provider. Document Released: 05/17/2015 Document Revised: 01/08/2016 Document Reviewed: 02/19/2015 Elsevier Interactive Patient Education  2017 [place] Prevention in the Home Falls can cause injuries. They can happen to people of all ages. There are many things you can do to make your home safe and to help prevent falls. What can I do on the outside of my home? Regularly fix the edges of walkways and driveways and fix any cracks. Remove anything that might make you trip as you walk through a door, such as a raised step or threshold. Trim any bushes or trees on the path to your home. Use bright outdoor lighting. Clear any walking paths of anything that might make someone trip, such as rocks or tools. Regularly check to see if handrails are loose or broken. Make sure that both sides of any steps have handrails. Any raised decks and porches should have guardrails on the edges. Have any leaves, snow, or ice cleared regularly. Use sand or salt on walking paths during winter. Clean up any spills in your garage right away. This includes oil or grease spills. What can I do in the bathroom? Use night lights. Install grab bars by the toilet and in the tub and shower. Do not use towel bars as grab bars. Use non-skid mats or decals in the tub or shower. If you need to sit down in the shower, use a plastic, non-slip stool. Keep the floor dry. Clean up any water that spills on the floor as soon as it happens. Remove soap buildup in the tub or shower regularly. Attach bath mats securely with double-sided non-slip rug tape. Do not have throw rugs and other things on the floor that can make you trip. What can I do in the bedroom? Use night lights. Make sure that you have a light by your bed that is easy to reach. Do not use any sheets or blankets that  are too big for your bed. They should not hang down onto the floor. Have a firm chair that has side arms. You can use this for support while you get dressed. Do not have throw rugs and other things on the floor that can make you trip. What can I do in the kitchen? Clean up any spills right away. Avoid walking on wet floors. Keep items that you use a lot in easy-to-reach places. If you need to reach something above you, use a strong step stool that has a grab bar. Keep electrical cords out of the way. Do not use floor polish or wax that makes floors slippery. If you must use wax, use non-skid floor wax. Do not have throw rugs and other things on the floor that can make you trip. What can I do with my stairs? Do not leave any items on the stairs. Make sure that there are handrails on both sides of the stairs and use them. Fix handrails that are broken or loose. Make sure that handrails are as long as the stairways. Check any carpeting to make sure that it is firmly attached to the stairs. Fix any carpet that is loose or worn. Avoid having throw rugs at the top or bottom of the stairs. If you do have throw rugs, attach  them to the floor with carpet tape. Make sure that you have a light switch at the top of the stairs and the bottom of the stairs. If you do not have them, ask someone to add them for you. What else can I do to help prevent falls? Wear shoes that: Do not have high heels. Have rubber bottoms. Are comfortable and fit you well. Are closed at the toe. Do not wear sandals. If you use a stepladder: Make sure that it is fully opened. Do not climb a closed stepladder. Make sure that both sides of the stepladder are locked into place. Ask someone to hold it for you, if possible. Clearly mark and make sure that you can see: Any grab bars or handrails. First and last steps. Where the edge of each step is. Use tools that help you move around (mobility aids) if they are needed. These  include: Canes. Walkers. Scooters. Crutches. Turn on the lights when you go into a dark area. Replace any light bulbs as soon as they burn out. Set up your furniture so you have a clear path. Avoid moving your furniture around. If any of your floors are uneven, fix them. If there are any pets around you, be aware of where they are. Review your medicines with your doctor. Some medicines can make you feel dizzy. This can increase your chance of falling. Ask your doctor what other things that you can do to help prevent falls. This information is not intended to replace advice given to you by your health care provider. Make sure you discuss any questions you have with your health care provider. Document Released: 02/14/2009 Document Revised: 09/26/2015 Document Reviewed: 05/25/2014 Elsevier Interactive Patient Education  2017 [employer].

## 2020-11-13 NOTE — Telephone Encounter (Signed)
Ms. ruqayya, ventress are scheduled for a virtual visit with your provider today.    Just as we do with appointments in the office, we must obtain your consent to participate.  Your consent will be active for this visit and any virtual visit you may have with one of our providers in the next 365 days.    If you have a MyChart account, I can also send a copy of this consent to you electronically.  All virtual visits are billed to your insurance company just like a traditional visit in the office.  As this is a virtual visit, video technology does not allow for your provider to perform a traditional examination.  This may limit your provider's ability to fully assess your condition.  If your provider identifies any concerns that need to be evaluated in person or the need to arrange testing such as labs, EKG, etc, we will make arrangements to do so.    Although advances in technology are sophisticated, we cannot ensure that it will always work on either your end or our end.  If the connection with a video visit is poor, we may have to switch to a telephone visit.  With either a video or telephone visit, we are not always able to ensure that we have a secure connection.   I need to obtain your verbal consent now.   Are you willing to proceed with your visit today?   Aftyn Yaniah Thiemann has provided verbal consent on 11/13/2020 for a virtual visit (video or telephone).   Elveria Royals, [facility] 11/13/2020  2:47 PM

## 2020-11-13 NOTE — Progress Notes (Signed)
Subjective:   Sara Charles is a 87 y.o. female who presents for Medicare Annual (Subsequent) preventive examination.  Review of Systems     Cardiac Risk Factors include: diabetes mellitus;advanced age (>2155men, 60>65 women)     Objective:    There were no vitals filed for this visit. There is no height or weight on file to calculate BMI.  Advanced Directives 11/13/2020 09/24/2020 12/25/2019 11/09/2019 08/07/2019 09/12/2018 09/11/2017  Does Patient Have a Medical Advance Directive? - Yes Yes Yes No Yes No  Type of Sales promotion account executiveAdvance Directive Healthcare Power of Attorney Healthcare Power of [employer] Healthcare Power of [employer] Healthcare Power of Attorney - Healthcare Power of Attorney -  Does patient want to make changes to medical advance directive? No - Patient declined No - Patient declined No - Patient declined No - Patient declined - No - Patient declined -  Copy of Healthcare Power of Attorney in Chart? Yes - validated most recent copy scanned in chart (See row information) Yes - validated most recent copy scanned in chart (See row information) - No - copy requested - No - copy requested -  Would patient like information on creating a medical advance directive? - - - - - - No - Patient declined    Current Medications (verified) Outpatient Encounter Medications as of 11/13/2020  Medication Sig   Calcium Carb-Cholecalciferol (CALCIUM + D3 PO) Take 1 tablet by mouth daily.   Docosahexaenoic Acid (OCEAN BLUE OPTIMAL HEALTH PO) Take one every other day.... OTC Striction D a natural way to support health blood sugar   ezetimibe (ZETIA) 10 MG tablet Take 1 tablet (10 mg total) by mouth daily. (Patient taking differently: Take 10 mg by mouth every other day.)   Glucosamine-Chondroit-Vit C-Mn (GLUCOSAMINE 1500 COMPLEX PO) Take 1 tablet by mouth daily as needed (for joint pain).   Glycerin-Hypromellose-PEG 400 (DRY EYE RELIEF DROPS OP) Apply to eye.   hydrocortisone 1 % ointment Apply 1 application  topically 2 (two) times daily. (Patient taking differently: Apply 1 application topically as needed.)   Misc Natural Products (FOCUSED MIND PO) Take by mouth as needed.   Multiple Vitamins-Minerals (HAIR/SKIN/NAILS) TABS Take by mouth as needed.   Multiple Vitamins-Minerals (MULTIVITAMIN WOMEN 50+) TABS Take 1 tablet by mouth daily.   Probiotic Product (UP4 PROBIOTICS PO) Take 1 tablet by mouth daily.   carbamide peroxide (DEBROX) 6.5 % OTIC solution Place 5 drops into the right ear 2 (two) times daily. For 7 days (Patient not taking: Reported on 11/13/2020)   Omega-3 Fatty Acids (FISH OIL) 1000 MG CAPS Take 1 capsule by mouth daily. (Patient not taking: No sig reported)   rosuvastatin (CRESTOR) 5 MG tablet Take 10 mg by mouth as directed. Every 3 days due to muscle cramps   [DISCONTINUED] cetirizine (ZYRTEC) 10 MG tablet Take 1 tablet (10 mg total) by mouth at bedtime.   No facility-administered encounter medications on file as of 11/13/2020.    Allergies (verified) Statins, Dairycare [lactase-lactobacillus], Erythromycin, Other, Pollen extract-tree extract [pollen extract], and Tomato   History: Past Medical History:  Diagnosis Date   Allergic rhinitis    Atypical chest pain 09/10/2015   Bronchitis, acute    Dyspnea on exertion    Exudative pleural effusion 5/09    sterile, benign, inflammatory   GERD (gastroesophageal reflux disease) 09/10/2015   Systolic murmur 09/10/2015   Past Surgical History:  Procedure Laterality Date   APPENDECTOMY     CATARACT EXTRACTION W/ INTRAOCULAR LENS IMPLANT Bilateral 12/2018  OOPHORECTOMY Right    thoracentesis left  5/09   Family History  Problem Relation Age of Onset   Allergies Mother    Heart disease Mother 22   Cancer Mother    Dementia Mother    Heart disease Father 14   Thyroid cancer Sister    Dementia Sister 23   Social History   Socioeconomic History   Marital status: Single    Spouse name: Not on file   Number of children: Not  on file   Years of education: Not on file   Highest education level: Not on file  Occupational History   Occupation: retired    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       Tobacco use, amount per day now: N/A   Past tobacco use, amount per day: N/A   How many years did you use tobacco: N/A   Alcohol use (drinks per week): N/A   Diet:   Do you drink/eat things with caffeine: CHOCOLATE   Marital status:   SINGLE                               What year were you married?   Do you live in a house, apartment, assisted living, condo, trailer, etc.? HOUSE   Is it one or more stories? ONE   How many persons live in your home? 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            NO                      Type and how often?   Do you have a living will? YES   Do you have a DNR form?                                   If not, do you want to discuss one?   Do you have signed POA/HPOA forms?     YES                   If so, please bring to you appointment   Social Determinants of Health   Financial Resource Strain: Not on file  Food Insecurity: Not on file  Transportation Needs: Not on file  Physical Activity: Not on file  Stress: Not on file  Social Connections: Not on file    Tobacco Counseling Counseling given: Not Answered   Clinical Intake:  Pre-visit preparation completed: Yes  Pain : No/denies pain     BMI - recorded: 26 Nutritional Status: BMI 25 -29 Overweight Nutritional Risks: None Diabetes: Yes  How often do  you need to have someone help you when you read instructions, pamphlets, or other written materials from your doctor or pharmacy?: 1 - Never  Diabetic?yes  Activities of Daily Living In your present state of health, do you have any difficulty performing the following activities: 11/13/2020  Hearing? Y  Vision? N  Difficulty concentrating or making decisions? N  Walking or climbing stairs? N  Dressing or bathing? N  Doing errands, shopping? N  Preparing Food and eating ? N  Using the Toilet? N  In the past six months, have you accidently leaked urine? N  Do you have problems with loss of bowel control? N  Managing your Medications? N  Managing your Finances? N  Housekeeping or managing your Housekeeping? N  Some recent data might be hidden    Patient Care Team: Octavia Heir, NP as PCP - General (Adult Health Nurse Practitioner) [name], MD as Consulting Physician (Ophthalmology)  Indicate any recent Medical Services you may have received from other than [facility] providers in the past year (date may be approximate).     Assessment:   This is a routine wellness examination for Sara Charles.  Hearing/Vision screen Hearing Screening - Comments:: Patient has tinnitus.  Vision Screening - Comments:: Patient wears reading glasses. Patient has not had recent eye exam. Patient sees Dr. Hazle Quant  Dietary issues and exercise activities discussed: Current Exercise Habits: The patient does not participate in regular exercise at present, Exercise limited by: Other - see comments   Goals Addressed             This Visit's Progress    Patient Stated       Maintain current level of health         Depression Screen PHQ 2/9 Scores 11/13/2020 06/27/2020 12/25/2019 11/09/2019 10/20/2018 09/12/2018 03/11/2017  PHQ - 2 Score 0 0 0 0 0 0 0    Fall Risk Fall Risk  11/13/2020 06/27/2020 12/25/2019 11/09/2019 10/20/2018  Falls in the past year? 0 0 0 0 0  Number falls in past yr: 0 0 0 0 0   Injury with Fall? 0 0 0 0 0  Risk for fall due to : No Fall Risks - - - -  Follow up Falls evaluation completed - - - -    FALL RISK PREVENTION PERTAINING TO THE HOME:  Any stairs in or around the home? Yes  If so, are there any without handrails? Yes  Home free of loose throw rugs in walkways, pet beds, electrical cords, etc? Yes  Adequate lighting in your home to reduce risk of falls? Yes   ASSISTIVE DEVICES UTILIZED TO PREVENT FALLS:  Life alert? No  Use of a cane, walker or w/c? No  Grab bars in the bathroom? Yes  Shower chair or bench in shower? Yes  Elevated toilet seat or a handicapped toilet? Yes   TIMED UP AND GO:  Was the test performed? No .   Cognitive Function:     6CIT Screen 11/13/2020 11/09/2019  What Year? 0 points 0 points  What month? 0 points 0 points  What time? 0 points 0 points  Count back from 20 0 points 0 points  Months in reverse 0 points 0 points  Repeat phrase 2 points 2 points  Total Score 2 2    Immunizations Immunization History  Administered Date(s) Administered   DTaP 11/16/2019   Fluad Quad(high Dose 65+) 02/23/2019   Influenza Split 02/02/2011, 04/03/2012   Influenza, High Dose Seasonal PF 03/12/2017, 04/11/2018, 03/04/2020   PFIZER(Purple Top)SARS-COV-2 Vaccination 06/26/2019, 07/28/2019, 02/09/2020, 10/14/2020   Pneumococcal Conjugate-13 04/30/2015   Pneumococcal Polysaccharide-23 03/09/2019   Tdap 11/16/2019  TDAP status: Up to date  Flu Vaccine status: Up to date  Pneumococcal vaccine status: Up to date  Covid-19 vaccine status: Completed vaccines  Qualifies for Shingles Vaccine? Yes   Zostavax completed No   Shingrix Completed?: Yes  Screening Tests Health Maintenance  Topic Date Due   OPHTHALMOLOGY EXAM  Never done   Zoster Vaccines- Shingrix (1 of 2) Never done   URINE MICROALBUMIN  12/24/2020   INFLUENZA VACCINE  12/02/2020   HEMOGLOBIN A1C  12/22/2020   FOOT EXAM  06/27/2021   TETANUS/TDAP  11/15/2029    DEXA SCAN  Completed   COVID-19 Vaccine  Completed   PNA vac Low Risk Adult  Completed   HPV VACCINES  Aged Out    Health Maintenance  Health Maintenance Due  Topic Date Due   OPHTHALMOLOGY EXAM  Never done   Zoster Vaccines- Shingrix (1 of 2) Never done   URINE MICROALBUMIN  12/24/2020    Colorectal cancer screening: No longer required.   Mammogram status: No longer required due to aged out.  Bone Density status: Completed 2021. Results reflect: Bone density results: NORMAL. Repeat every 2 years.  Lung Cancer Screening: (Low Dose CT Chest recommended if Age 44-80 years, 30 pack-year currently smoking OR have quit w/in 15years.) does not qualify.   Lung Cancer Screening Referral: na  Additional Screening:  Hepatitis C Screening: does not qualify;   Vision Screening: Recommended annual ophthalmology exams for early detection of glaucoma and other disorders of the eye. Is the patient up to date with their annual eye exam?  No  Who is the provider or what is the name of the office in which the patient attends annual eye exams? Dr Hazle Quant  If pt is not established with a provider, would they like to be referred to a provider to establish care? No .   Dental Screening: Recommended annual dental exams for proper oral hygiene  Community Resource Referral / Chronic Care Management: CRR required this visit?  No   CCM required this visit?  No      Plan:     I have personally reviewed and noted the following in the patient's chart:   Medical and social history Use of alcohol, tobacco or illicit drugs  Current medications and supplements including opioid prescriptions.  Functional ability and status Nutritional status Physical activity Advanced directives List of other physicians Hospitalizations, surgeries, and ER visits in previous 12 months Vitals Screenings to include cognitive, depression, and falls Referrals and appointments  In addition, I have reviewed and  discussed with patient certain preventive protocols, quality metrics, and best practice recommendations. A written personalized care plan for preventive services as well as general preventive health recommendations were provided to patient.     Sharon Seller, NP   11/13/2020    Virtual Visit via Telephone Note  I connected with pt on 11/13/20 at  2:15 PM EDT by telephone and verified that I am speaking with the correct person using two identifiers.  Location: Patient: home Provider: [facility]   I discussed the limitations, risks, security and privacy concerns of performing an evaluation and management service by telephone and the availability of in person appointments. I also discussed with the patient that there may be a patient responsible charge related to this service. The patient expressed understanding and agreed to proceed.   I discussed the assessment and treatment plan with the patient. The patient was provided an opportunity to ask questions and all were answered. The patient  agreed with the plan and demonstrated an understanding of the instructions.   The patient was advised to call back or seek an in-person evaluation if the symptoms worsen or if the condition fails to improve as anticipated.  I provided 15 minutes of non-face-to-face time during this encounter.  Sara Charles. [address] printed and mailed

## 2020-12-09 ENCOUNTER — Other Ambulatory Visit: Payer: Self-pay

## 2020-12-09 DIAGNOSIS — E119 Type 2 diabetes mellitus without complications: Secondary | ICD-10-CM

## 2020-12-09 DIAGNOSIS — E782 Mixed hyperlipidemia: Secondary | ICD-10-CM

## 2020-12-09 DIAGNOSIS — R739 Hyperglycemia, unspecified: Secondary | ICD-10-CM

## 2020-12-31 ENCOUNTER — Other Ambulatory Visit: Payer: Self-pay

## 2020-12-31 ENCOUNTER — Other Ambulatory Visit: Payer: Medicare PPO

## 2020-12-31 DIAGNOSIS — R739 Hyperglycemia, unspecified: Secondary | ICD-10-CM

## 2020-12-31 DIAGNOSIS — E119 Type 2 diabetes mellitus without complications: Secondary | ICD-10-CM

## 2020-12-31 DIAGNOSIS — E782 Mixed hyperlipidemia: Secondary | ICD-10-CM

## 2021-01-01 ENCOUNTER — Encounter: Payer: Self-pay | Admitting: Orthopedic Surgery

## 2021-01-01 LAB — COMPLETE METABOLIC PANEL WITH GFR
AG Ratio: 1.6 (calc) (ref 1.0–2.5)
ALT: 18 U/L (ref 6–29)
AST: 17 U/L (ref 10–35)
Albumin: 4.1 g/dL (ref 3.6–5.1)
Alkaline phosphatase (APISO): 73 U/L (ref 37–153)
BUN: 19 mg/dL (ref 7–25)
CO2: 24 mmol/L (ref 20–32)
Calcium: 9.9 mg/dL (ref 8.6–10.4)
Chloride: 105 mmol/L (ref 98–110)
Creat: 0.81 mg/dL (ref 0.60–0.95)
Globulin: 2.6 g/dL (calc) (ref 1.9–3.7)
Glucose, Bld: 120 mg/dL — ABNORMAL HIGH (ref 65–99)
Potassium: 4 mmol/L (ref 3.5–5.3)
Sodium: 140 mmol/L (ref 135–146)
Total Bilirubin: 0.6 mg/dL (ref 0.2–1.2)
Total Protein: 6.7 g/dL (ref 6.1–8.1)
eGFR: 72 mL/min/{1.73_m2} (ref >=60)

## 2021-01-01 LAB — CBC WITH DIFFERENTIAL/PLATELET
Absolute Monocytes: 581 cells/uL (ref 200–950)
Basophils Absolute: 53 cells/uL (ref 0–200)
Basophils Relative: 0.8 %
Eosinophils Absolute: 218 cells/uL (ref 15–500)
Eosinophils Relative: 3.3 %
HCT: 46.1 % — ABNORMAL HIGH (ref 35.0–45.0)
Hemoglobin: 15.7 g/dL — ABNORMAL HIGH (ref 11.7–15.5)
Lymphs Abs: 1822 cells/uL (ref 850–3900)
MCH: 29.8 pg (ref 27.0–33.0)
MCHC: 34.1 g/dL (ref 32.0–36.0)
MCV: 87.6 fL (ref 80.0–100.0)
MPV: 10.1 fL (ref 7.5–12.5)
Monocytes Relative: 8.8 %
Neutro Abs: 3927 cells/uL (ref 1500–7800)
Neutrophils Relative %: 59.5 %
Platelets: 280 10*3/uL (ref 140–400)
RBC: 5.26 10*6/uL — ABNORMAL HIGH (ref 3.80–5.10)
RDW: 12.3 % (ref 11.0–15.0)
Total Lymphocyte: 27.6 %
WBC: 6.6 10*3/uL (ref 3.8–10.8)

## 2021-01-01 LAB — LIPID PANEL
Cholesterol: 177 mg/dL (ref <200)
HDL: 53 mg/dL (ref >=50)
LDL Cholesterol (Calc): 104 mg/dL (calc) — ABNORMAL HIGH
Non-HDL Cholesterol (Calc): 124 mg/dL (calc) (ref <130)
Total CHOL/HDL Ratio: 3.3 (calc) (ref <5.0)
Triglycerides: 105 mg/dL (ref <150)

## 2021-01-01 LAB — HEMOGLOBIN A1C
Hgb A1c MFr Bld: 6.4 % of total Hgb — ABNORMAL HIGH (ref <5.7)
Mean Plasma Glucose: 137 mg/dL
eAG (mmol/L): 7.6 mmol/L

## 2021-01-02 ENCOUNTER — Ambulatory Visit (INDEPENDENT_AMBULATORY_CARE_PROVIDER_SITE_OTHER): Payer: Medicare PPO | Admitting: Orthopedic Surgery

## 2021-01-02 ENCOUNTER — Other Ambulatory Visit: Payer: Self-pay

## 2021-01-02 ENCOUNTER — Encounter: Payer: Self-pay | Admitting: Orthopedic Surgery

## 2021-01-02 VITALS — BP 120/78 | HR 90 | Temp 97.5°F | Resp 16 | Ht 63.0 in | Wt 152.6 lb

## 2021-01-02 DIAGNOSIS — Z23 Encounter for immunization: Secondary | ICD-10-CM | POA: Diagnosis not present

## 2021-01-02 DIAGNOSIS — E782 Mixed hyperlipidemia: Secondary | ICD-10-CM

## 2021-01-02 DIAGNOSIS — K219 Gastro-esophageal reflux disease without esophagitis: Secondary | ICD-10-CM

## 2021-01-02 DIAGNOSIS — E119 Type 2 diabetes mellitus without complications: Secondary | ICD-10-CM

## 2021-01-02 DIAGNOSIS — R413 Other amnesia: Secondary | ICD-10-CM | POA: Diagnosis not present

## 2021-01-02 DIAGNOSIS — Z1283 Encounter for screening for malignant neoplasm of skin: Secondary | ICD-10-CM

## 2021-01-02 DIAGNOSIS — J302 Other seasonal allergic rhinitis: Secondary | ICD-10-CM

## 2021-01-02 NOTE — Patient Instructions (Addendum)
Please check on shingles vaccine   Will plan to do memory test next routine visit  May try flonase or saline nasal spray for allergies  Dermatology referral made- Dr. Terri Piedra

## 2021-01-02 NOTE — Progress Notes (Signed)
Careteam: Patient Care Team: Sara Heir, NP as PCP - General (Adult Health Nurse Practitioner) Sara Chimes, MD as Consulting Physician (Ophthalmology)  Seen by: Sara Charles, AGNP-C  PLACE OF SERVICE:  [facility]  Advanced Directive information Does Patient Have a Medical Advance Directive?: Yes, Type of Advance Directive: Healthcare Power of Attorney, Does patient want to make changes to medical advance directive?: No - Patient declined  Allergies  Allergen Reactions   Statins Other (See Comments)    Myalgias with crestor even when taken only a few times per week   Dairycare [Lactase-Lactobacillus]     Stomach upset    Erythromycin Itching    SEVERE ITCHING   Other Other (See Comments)    NO "ACIDIC" FOODS!! They cause GI upset   Pollen Extract-Tree Extract [Pollen Extract]    Tomato Other (See Comments)    Causes GI upset    Chief Complaint  Patient presents with   Medical Management of Chronic Issues    6 month follow up.   Health Maintenance    Discuss the need for urine microalbumin, and eye exam.   Immunizations    Discuss the need for influenza vaccine, and shingrix vaccine.     HPI: Patient is a 87 y.o. female seen today for medical management of chronic conditions.   Labs discussed with patient.   She has been enjoying her summer. Reports eating healthier foods and enjoying the outdoors more. She does complain of some seasonal allergies and symptoms of runny nose and sneezing.   Continues to take Zetia daily and follow low fat diet.   Denies heartburn, continues to take probiotics.   Plans to have diabetic eye exam with Dr. Abran Duke in September. Denies changes to vision.   No recent falls or injuries.   She believes she had her shingles in the past. She is going to ask local pharmacy and/or insurance.   Requesting flu vaccine today.   Requesting dermatology referral for annual skin check.   Reports she is forgetful at times, worries she will  get dementia like her sister. Mother and other family members also had dementia. Discussed having MMSE next visit.    Review of Systems:  Review of Systems  Constitutional:  Negative for chills, fever, malaise/fatigue and weight loss.  HENT:  Negative for hearing loss and sore throat.   Eyes:  Negative for blurred vision and double vision.  Respiratory:  Negative for cough, shortness of breath and wheezing.   Cardiovascular:  Negative for chest pain and leg swelling.  Gastrointestinal:  Negative for abdominal pain, blood in stool, constipation, diarrhea, heartburn, nausea and vomiting.  Genitourinary:  Negative for dysuria, frequency and hematuria.  Musculoskeletal:  Negative for falls.       Left knee pain  Skin: Negative.   Neurological:  Negative for dizziness, weakness and headaches.  Psychiatric/Behavioral:  Negative for depression. The patient is not nervous/anxious and does not have insomnia.    Past Medical History:  Diagnosis Date   Allergic rhinitis    Atypical chest pain 09/10/2015   Bronchitis, acute    Dyspnea on exertion    Exudative pleural effusion 5/09    sterile, benign, inflammatory   GERD (gastroesophageal reflux disease) 09/10/2015   Systolic murmur 09/10/2015   Past Surgical History:  Procedure Laterality Date   APPENDECTOMY     CATARACT EXTRACTION W/ INTRAOCULAR LENS IMPLANT Bilateral 12/2018   OOPHORECTOMY Right    thoracentesis left  5/09   Social History:  reports that she has never smoked. She has never used smokeless tobacco. She reports that she does not drink alcohol and does not use drugs.  Family History  Problem Relation Age of Onset   Allergies Mother    Heart disease Mother 21   Cancer Mother    Dementia Mother    Heart disease Father 92   Thyroid cancer Sister    Dementia Sister 15    Medications: Patient's Medications  New Prescriptions   No medications on file  Previous Medications   CALCIUM CARB-CHOLECALCIFEROL (CALCIUM + D3 PO)     Take 1 tablet by mouth daily.   CARBAMIDE PEROXIDE (DEBROX) 6.5 % OTIC SOLUTION    Place 5 drops into the right ear 2 (two) times daily. For 7 days   DOCOSAHEXAENOIC ACID (OCEAN BLUE OPTIMAL HEALTH PO)    Take one every other day.... OTC Striction D a natural way to support health blood sugar   EZETIMIBE (ZETIA) 10 MG TABLET    Take 1 tablet (10 mg total) by mouth daily.   GLUCOSAMINE-CHONDROIT-VIT C-MN (GLUCOSAMINE 1500 COMPLEX PO)    Take 1 tablet by mouth daily as needed (for joint pain).   GLYCERIN-HYPROMELLOSE-PEG 400 (DRY EYE RELIEF DROPS OP)    Apply to eye.   HYDROCORTISONE 1 % OINTMENT    Apply 1 application topically 2 (two) times daily.   MISC NATURAL PRODUCTS (FOCUSED MIND PO)    Take by mouth as needed.   MULTIPLE VITAMINS-MINERALS (HAIR/SKIN/NAILS) TABS    Take by mouth as needed.   MULTIPLE VITAMINS-MINERALS (MULTIVITAMIN WOMEN 50+) TABS    Take 1 tablet by mouth daily.   OMEGA-3 FATTY ACIDS (FISH OIL) 1000 MG CAPS    Take 1 capsule by mouth daily.   PROBIOTIC PRODUCT (UP4 PROBIOTICS PO)    Take 1 tablet by mouth daily.   ROSUVASTATIN (CRESTOR) 5 MG TABLET    Take 10 mg by mouth as directed. Every 3 days due to muscle cramps  Modified Medications   No medications on file  Discontinued Medications   No medications on file    Physical Exam:  There were no vitals filed for this visit. There is no height or weight on file to calculate BMI. Wt Readings from Last 3 Encounters:  09/24/20 152 lb (68.9 kg)  06/27/20 158 lb 12.8 oz (72 kg)  12/25/19 151 lb (68.5 kg)    Physical Exam Vitals reviewed.  Constitutional:      General: She is not in acute distress. HENT:     Head: Normocephalic.     Right Ear: There is no impacted cerumen.     Left Ear: There is no impacted cerumen.     Nose: Nose normal.     Mouth/Throat:     Mouth: Mucous membranes are moist.  Eyes:     General:        Right eye: No discharge.        Left eye: No discharge.  Neck:     Vascular: No  carotid bruit.  Cardiovascular:     Rate and Rhythm: Normal rate and regular rhythm.     Pulses: Normal pulses.     Heart sounds: Normal heart sounds. No murmur heard. Pulmonary:     Effort: Pulmonary effort is normal. No respiratory distress.     Breath sounds: Normal breath sounds. No wheezing.  Abdominal:     General: Bowel sounds are normal. There is no distension.     Palpations: Abdomen is  soft.     Tenderness: There is no abdominal tenderness.  Musculoskeletal:     Cervical back: Normal range of motion.     Right lower leg: No edema.     Left lower leg: No edema.  Lymphadenopathy:     Cervical: No cervical adenopathy.  Skin:    General: Skin is warm and dry.     Capillary Refill: Capillary refill takes less than 2 seconds.  Neurological:     General: No focal deficit present.     Mental Status: She is alert and oriented to person, place, and time.  Psychiatric:        Mood and Affect: Mood normal.        Behavior: Behavior normal.    Labs reviewed: Basic Metabolic Panel: Recent Labs    06/24/20 1023 12/31/20 1030  NA 142 140  K 4.8 4.0  CL 104 105  CO2 28 24  GLUCOSE 132* 120*  BUN 16 19  CREATININE 0.94* 0.81  CALCIUM 9.8 9.9   Liver Function Tests: Recent Labs    12/31/20 1030  AST 17  ALT 18  BILITOT 0.6  PROT 6.7   No results for input(s): LIPASE, AMYLASE in the last 8760 hours. No results for input(s): AMMONIA in the last 8760 hours. CBC: Recent Labs    06/24/20 1023 12/31/20 1030  WBC 7.1 6.6  NEUTROABS 4,367 3,927  HGB 16.1* 15.7*  HCT 47.1* 46.1*  MCV 89.2 87.6  PLT 294 280   Lipid Panel: Recent Labs    06/24/20 1023 12/31/20 1030  CHOL 171 177  HDL 52 53  LDLCALC 94 104*  TRIG 149 105  CHOLHDL 3.3 3.3   TSH: No results for input(s): TSH in the last 8760 hours. A1C: Lab Results  Component Value Date   HGBA1C 6.4 (H) 12/31/2020     Assessment/Plan 1. Screening exam for skin cancer - Ambulatory referral to  Dermatology  2. Controlled type 2 diabetes mellitus without complication, without long-term current use of insulin (HCC) - a1c 6.4 12/31/2020 - Microalbumin/Creatinine Ratio, Urine- 20 01/02/2021 - plans to have diabetic eye exam with Dr. Abran Duke 01/2021 - cont diet low in carbs and sugars - cont exercise, recommend 150 min/week   3. Need for influenza vaccination - Flu Vaccine QUAD High Dose(Fluad)  4. Impaired memory - reports being forgetful - multiple family members had dementia- sister/mother - MMSE- future  5. Mixed hyperlipidemia - LDL 104 12/31/2020 - cont zetia - cont diet low in fat and avoid fried foods  6. Gastroesophageal reflux disease, unspecified whether esophagitis present - stable with probiotic  7. Seasonal allergies - symptoms include runny nose and sneezing - not interested in Claritin and Zyrtec - discussed Flonase OTC   Total time: 32 minutes. Greater than 50% of total time spent doing patient education regarding health maintenance, lab results and medication management.   Next appt: Visit date not found  Akasha Melena Scherry Ran  [facility] [phone]

## 2021-01-03 ENCOUNTER — Ambulatory Visit: Payer: Medicare PPO | Admitting: Orthopedic Surgery

## 2021-01-03 LAB — MICROALBUMIN / CREATININE URINE RATIO
Creatinine, Urine: 20 mg/dL (ref 20–275)
Microalb Creat Ratio: 10 mcg/mg creat (ref <30)
Microalb, Ur: 0.2 mg/dL

## 2021-03-31 ENCOUNTER — Telehealth: Payer: Self-pay

## 2021-03-31 MED ORDER — FLUTICASONE PROPIONATE 50 MCG/ACT NA SUSP: 1.0000 | Freq: Every day | NASAL | 11 refills | Status: DC

## 2021-03-31 NOTE — Telephone Encounter (Signed)
Left detailed message informing patient of Amy's response

## 2021-03-31 NOTE — Telephone Encounter (Signed)
Flonase daily- 1 spray to each nostril is fine.

## 2021-03-31 NOTE — Telephone Encounter (Signed)
Incoming fax received from [facility] [address] for flonase 50 mcg spray, 1 spray in both nostrils daily  Called placed to patient to inquire about request, as medication is not on active medication list.  Patient states the nurse with [facility] recommended due to stuffy and runny nose (allergies).  Patient questions if you will approve

## 2021-06-27 ENCOUNTER — Other Ambulatory Visit: Payer: Self-pay | Admitting: Orthopedic Surgery

## 2021-06-27 DIAGNOSIS — K219 Gastro-esophageal reflux disease without esophagitis: Secondary | ICD-10-CM

## 2021-06-27 DIAGNOSIS — E782 Mixed hyperlipidemia: Secondary | ICD-10-CM

## 2021-06-27 DIAGNOSIS — E119 Type 2 diabetes mellitus without complications: Secondary | ICD-10-CM

## 2021-06-30 ENCOUNTER — Other Ambulatory Visit: Payer: Medicare PPO

## 2021-06-30 ENCOUNTER — Other Ambulatory Visit: Payer: Self-pay

## 2021-06-30 DIAGNOSIS — E782 Mixed hyperlipidemia: Secondary | ICD-10-CM

## 2021-06-30 DIAGNOSIS — E119 Type 2 diabetes mellitus without complications: Secondary | ICD-10-CM

## 2021-06-30 DIAGNOSIS — K219 Gastro-esophageal reflux disease without esophagitis: Secondary | ICD-10-CM | POA: Diagnosis not present

## 2021-07-01 LAB — CBC WITH DIFFERENTIAL/PLATELET
Absolute Monocytes: 527 cells/uL (ref 200–950)
Basophils Absolute: 50 cells/uL (ref 0–200)
Basophils Relative: 0.8 %
Eosinophils Absolute: 267 cells/uL (ref 15–500)
Eosinophils Relative: 4.3 %
HCT: 46.3 % — ABNORMAL HIGH (ref 35.0–45.0)
Hemoglobin: 15.4 g/dL (ref 11.7–15.5)
Lymphs Abs: 2114 cells/uL (ref 850–3900)
MCH: 29.5 pg (ref 27.0–33.0)
MCHC: 33.3 g/dL (ref 32.0–36.0)
MCV: 88.7 fL (ref 80.0–100.0)
MPV: 10.3 fL (ref 7.5–12.5)
Monocytes Relative: 8.5 %
Neutro Abs: 3243 cells/uL (ref 1500–7800)
Neutrophils Relative %: 52.3 %
Platelets: 302 10*3/uL (ref 140–400)
RBC: 5.22 10*6/uL — ABNORMAL HIGH (ref 3.80–5.10)
RDW: 12.2 % (ref 11.0–15.0)
Total Lymphocyte: 34.1 %
WBC: 6.2 10*3/uL (ref 3.8–10.8)

## 2021-07-01 LAB — COMPREHENSIVE METABOLIC PANEL
AG Ratio: 1.6 (calc) (ref 1.0–2.5)
ALT: 19 U/L (ref 6–29)
AST: 18 U/L (ref 10–35)
Albumin: 4.1 g/dL (ref 3.6–5.1)
Alkaline phosphatase (APISO): 76 U/L (ref 37–153)
BUN: 17 mg/dL (ref 7–25)
CO2: 25 mmol/L (ref 20–32)
Calcium: 9.7 mg/dL (ref 8.6–10.4)
Chloride: 106 mmol/L (ref 98–110)
Creat: 0.84 mg/dL (ref 0.60–0.95)
Globulin: 2.5 g/dL (calc) (ref 1.9–3.7)
Glucose, Bld: 113 mg/dL — ABNORMAL HIGH (ref 65–99)
Potassium: 4.2 mmol/L (ref 3.5–5.3)
Sodium: 142 mmol/L (ref 135–146)
Total Bilirubin: 0.5 mg/dL (ref 0.2–1.2)
Total Protein: 6.6 g/dL (ref 6.1–8.1)

## 2021-07-01 LAB — LIPID PANEL
Cholesterol: 201 mg/dL — ABNORMAL HIGH (ref <200)
HDL: 51 mg/dL (ref >=50)
LDL Cholesterol (Calc): 124 mg/dL (calc) — ABNORMAL HIGH
Non-HDL Cholesterol (Calc): 150 mg/dL (calc) — ABNORMAL HIGH (ref <130)
Total CHOL/HDL Ratio: 3.9 (calc) (ref <5.0)
Triglycerides: 143 mg/dL (ref <150)

## 2021-07-01 LAB — HEMOGLOBIN A1C
Hgb A1c MFr Bld: 6.5 % of total Hgb — ABNORMAL HIGH (ref <5.7)
Mean Plasma Glucose: 140 mg/dL
eAG (mmol/L): 7.7 mmol/L

## 2021-07-02 ENCOUNTER — Encounter: Payer: Self-pay | Admitting: Orthopedic Surgery

## 2021-07-03 ENCOUNTER — Encounter: Payer: Self-pay | Admitting: Nurse Practitioner

## 2021-07-03 ENCOUNTER — Ambulatory Visit: Payer: Medicare PPO | Admitting: Nurse Practitioner

## 2021-07-03 ENCOUNTER — Other Ambulatory Visit: Payer: Self-pay

## 2021-07-03 VITALS — BP 120/60 | Temp 97.0°F | Ht 63.0 in | Wt 156.0 lb

## 2021-07-03 DIAGNOSIS — E782 Mixed hyperlipidemia: Secondary | ICD-10-CM

## 2021-07-03 DIAGNOSIS — E1140 Type 2 diabetes mellitus with diabetic neuropathy, unspecified: Secondary | ICD-10-CM

## 2021-07-03 DIAGNOSIS — R030 Elevated blood-pressure reading, without diagnosis of hypertension: Secondary | ICD-10-CM

## 2021-07-03 DIAGNOSIS — M19042 Primary osteoarthritis, left hand: Secondary | ICD-10-CM | POA: Diagnosis not present

## 2021-07-03 DIAGNOSIS — H6123 Impacted cerumen, bilateral: Secondary | ICD-10-CM

## 2021-07-03 DIAGNOSIS — M19041 Primary osteoarthritis, right hand: Secondary | ICD-10-CM | POA: Diagnosis not present

## 2021-07-03 DIAGNOSIS — J302 Other seasonal allergic rhinitis: Secondary | ICD-10-CM | POA: Diagnosis not present

## 2021-07-03 MED ORDER — EZETIMIBE 10 MG PO TABS: 10.0000 mg | ORAL_TABLET | ORAL | 1 refills | Status: DC

## 2021-07-03 MED ORDER — ROSUVASTATIN CALCIUM 5 MG PO TABS: 10.0000 mg | ORAL_TABLET | ORAL | 1 refills | Status: DC

## 2021-07-03 MED ORDER — FLUTICASONE PROPIONATE 50 MCG/ACT NA SUSP: 1.0000 | Freq: Every day | NASAL | 11 refills | Status: DC

## 2021-07-03 NOTE — Patient Instructions (Signed)
Please sign a record release for ophthalmology on check out. 

## 2021-07-03 NOTE — Progress Notes (Signed)
? ? ?Careteam: ?Patient Care Team: ?Octavia Heir, NP as PCP - General (Adult Health Nurse Practitioner) ?[name], MD as Consulting Physician (Ophthalmology) ? ?PLACE OF SERVICE:  ?[facility]  ?Advanced Directive information ?Does Patient Have a Medical Advance Directive?: Yes, Type of Advance Directive: Healthcare Power of [place];Living will, Does patient want to make changes to medical advance directive?: No - Patient declined ? ?Allergies  ?Allergen Reactions  ? Statins Other (See Comments)  ?  Myalgias with crestor even when taken only a few times per week  ? Dairycare [Lactase-Lactobacillus]   ?  Stomach upset   ? Erythromycin Itching  ?  SEVERE ITCHING  ? Other Other (See Comments)  ?  NO "ACIDIC" FOODS!! They cause GI upset  ? Pollen Extract-Tree Extract [Pollen Extract]   ? Tomato Other (See Comments)  ?  Causes GI upset  ? Zyrtec [Cetirizine Hcl]   ?  Increased sleepiness  ? ? ?Chief Complaint  ?Patient presents with  ? Medical Management of Chronic Issues  ?  6 month follow up. Foot exam today. Discuss the need for eye exam, or post pone if patient refuses. Patient would like to discuss pneumonia vaccine. Discuss lab results.  ? ? ? ?HPI: Patient is a 88 y.o. female for routine follow up ? ?Hyperlipidemia- on zetia and crestor LDL has  increased to 124 on recent lab ? ?DM- seeing [facility], A1c slightly worse on recent lab. She does like to eat candybars ? ?Seasonal allergies- have been bothering her more recently with the early blooms.  ? ?No acute concerns today.  ? ?Labs reviewed in detail with patient today  ? ?Reports she generally has wax build up in ears at time of appt.  ? ?Review of Systems:  ?Review of Systems  ?Constitutional:  Negative for chills, fever and weight loss.  ?HENT:  Negative for tinnitus.   ?Respiratory:  Negative for cough, sputum production and shortness of breath.   ?Cardiovascular:  Negative for chest pain, palpitations and leg swelling.  ?Gastrointestinal:  Negative for  abdominal pain, constipation, diarrhea and heartburn.  ?Genitourinary:  Negative for dysuria, frequency and urgency.  ?Musculoskeletal:  Negative for back pain, falls, joint pain and myalgias.  ?Skin: Negative.   ?Neurological:  Negative for dizziness and headaches.  ?Psychiatric/Behavioral:  Negative for depression and memory loss. The patient does not have insomnia.   ? ?Past Medical History:  ?Diagnosis Date  ? Allergic rhinitis   ? Atypical chest pain 09/10/2015  ? Bronchitis, acute   ? Dyspnea on exertion   ? Exudative pleural effusion 5/09   ? sterile, benign, inflammatory  ? GERD (gastroesophageal reflux disease) 09/10/2015  ? Systolic murmur 09/10/2015  ? ?Past Surgical History:  ?Procedure Laterality Date  ? APPENDECTOMY    ? CATARACT EXTRACTION W/ INTRAOCULAR LENS IMPLANT Bilateral 12/2018  ? OOPHORECTOMY Right   ? thoracentesis left  5/09  ? ?Social History: ?  reports that she has never smoked. She has never used smokeless tobacco. She reports that she does not drink alcohol and does not use drugs. ? ?Family History  ?Problem Relation Age of Onset  ? Allergies Mother   ? Heart disease Mother 22  ? Cancer Mother   ? Dementia Mother   ? Heart disease Father 46  ? Thyroid cancer Sister   ? Dementia Sister 42  ? ? ?Medications: ?Patient's Medications  ?New Prescriptions  ? No medications on file  ?Previous Medications  ? CALCIUM CARB-CHOLECALCIFEROL (CALCIUM + D3  PO)    Take 1 tablet by mouth daily.  ? CARBAMIDE PEROXIDE (DEBROX) 6.5 % OTIC SOLUTION    Place 5 drops into the right ear 2 (two) times daily. For 7 days  ? DOCOSAHEXAENOIC ACID (OCEAN BLUE OPTIMAL HEALTH PO)    Take one every other day.... OTC Striction D a natural way to support health blood sugar  ? EZETIMIBE (ZETIA) 10 MG TABLET    Take 1 tablet (10 mg total) by mouth daily.  ? FLUTICASONE (FLONASE) 50 MCG/ACT NASAL SPRAY    Place 1 spray into both nostrils daily.  ? GLUCOSAMINE-CHONDROIT-VIT C-MN (GLUCOSAMINE 1500 COMPLEX PO)    Take 1 tablet by  mouth daily as needed (for joint pain).  ? GLYCERIN-HYPROMELLOSE-PEG 400 (DRY EYE RELIEF DROPS OP)    Apply to eye.  ? MISC NATURAL PRODUCTS (FOCUSED MIND PO)    Take by mouth as needed.  ? MULTIPLE VITAMINS-MINERALS (MULTIVITAMIN WOMEN 50+) TABS    Take 1 tablet by mouth daily.  ? PROBIOTIC PRODUCT (UP4 PROBIOTICS PO)    Take 1 tablet by mouth daily.  ? ROSUVASTATIN (CRESTOR) 5 MG TABLET    Take 10 mg by mouth as directed. Every 3 days due to muscle cramps  ?Modified Medications  ? No medications on file  ?Discontinued Medications  ? MULTIPLE VITAMINS-MINERALS (HAIR/SKIN/NAILS) TABS    Take by mouth as needed.  ? ? ?Physical Exam: ? ?Vitals:  ? 07/03/21 1539  ?BP: 140/90  ?Temp: (!) 97 ?F (36.1 ?C)  ?SpO2: 94%  ?Weight: 156 lb (70.8 kg)  ?Height: 5\' 3"  (1.6 m)  ? ?Body mass index is 27.63 kg/m?. ?Wt Readings from Last 3 Encounters:  ?07/03/21 156 lb (70.8 kg)  ?01/02/21 152 lb 9.6 oz (69.2 kg)  ?09/24/20 152 lb (68.9 kg)  ? ? ?Physical Exam ?Constitutional:   ?   General: She is not in acute distress. ?   Appearance: She is well-developed. She is not diaphoretic.  ?HENT:  ?   Head: Normocephalic and atraumatic.  ?   Right Ear: There is impacted cerumen.  ?   Left Ear: There is impacted cerumen.  ?   Mouth/Throat:  ?   Pharynx: No oropharyngeal exudate.  ?Eyes:  ?   Conjunctiva/sclera: Conjunctivae normal.  ?   Pupils: Pupils are equal, round, and reactive to light.  ?Cardiovascular:  ?   Rate and Rhythm: Normal rate and regular rhythm.  ?   Heart sounds: Normal heart sounds.  ?Pulmonary:  ?   Effort: Pulmonary effort is normal.  ?   Breath sounds: Normal breath sounds.  ?Abdominal:  ?   General: Bowel sounds are normal.  ?   Palpations: Abdomen is soft.  ?Musculoskeletal:  ?   Cervical back: Normal range of motion and neck supple.  ?   Right lower leg: No edema.  ?   Left lower leg: No edema.  ?Skin: ?   General: Skin is warm and dry.  ?Neurological:  ?   Mental Status: She is alert.  ?Psychiatric:     ?   Mood and  Affect: Mood normal.  ? ? ?Labs reviewed: ?Basic Metabolic Panel: ?Recent Labs  ?  12/31/20 ?1030 06/30/21 ?1015  ?NA 140 142  ?K 4.0 4.2  ?CL 105 106  ?CO2 24 25  ?GLUCOSE 120* 113*  ?BUN 19 17  ?CREATININE 0.81 0.84  ?CALCIUM 9.9 9.7  ? ?Liver Function Tests: ?Recent Labs  ?  12/31/20 ?1030 06/30/21 ?1015  ?AST 17  18  ?ALT 18 19  ?BILITOT 0.6 0.5  ?PROT 6.7 6.6  ? ?No results for input(s): LIPASE, AMYLASE in the last 8760 hours. ?No results for input(s): AMMONIA in the last 8760 hours. ?CBC: ?Recent Labs  ?  12/31/20 ?1030 06/30/21 ?1015  ?WBC 6.6 6.2  ?NEUTROABS 3,927 3,243  ?HGB 15.7* 15.4  ?HCT 46.1* 46.3*  ?MCV 87.6 88.7  ?PLT 280 302  ? ?Lipid Panel: ?Recent Labs  ?  12/31/20 ?1030 06/30/21 ?1015  ?CHOL 177 201*  ?HDL 53 51  ?LDLCALC 104* 124*  ?TRIG 105 143  ?CHOLHDL 3.3 3.9  ? ?TSH: ?No results for input(s): TSH in the last 8760 hours. ?A1C: ?Lab Results  ?Component Value Date  ? HGBA1C 6.5 (H) 06/30/2021  ? ? ? ?Assessment/Plan ?1. Controlled type 2 diabetes mellitus with diabetic neuropathy, without long-term current use of insulin (HCC) ?-Encouraged dietary compliance, routine foot care/monitoring and to keep up with diabetic eye exams through ophthalmology  ? ?2. Mixed hyperlipidemia ?-dietary modifications encouraged ?- rosuvastatin (CRESTOR) 5 MG tablet; Take 2 tablets (10 mg total) by mouth every other day.  Dispense: 90 tablet; Refill: 1 ?- ezetimibe (ZETIA) 10 MG tablet; Take 1 tablet (10 mg total) by mouth every other day.  Dispense: 90 tablet; Refill: 1 ? ?3. Seasonal allergies ?-stable, continues on flonase ?- fluticasone (FLONASE) 50 MCG/ACT nasal spray; Place 1 spray into both nostrils daily.  Dispense: 16 g; Refill: 11 ? ?4. Elevated blood pressure reading ?-improved on recheck, dietary modifications encouraged ? ?5. Primary osteoarthritis of both hands ?-stable, will monitor. ? ?6. Bilateral impacted cerumen ?- Ear Lavage completed and wax removed with curette bilaterally, pt tolerated well.   ? ? ?Return in about 6 months (around 01/03/2022) for routine follow up with Amy.PCP ?Janene Harvey. Janyth Contes, AGNP ? ?[facility] ?[phone]  ?

## 2021-08-19 ENCOUNTER — Encounter: Payer: Self-pay | Admitting: Family

## 2021-08-19 ENCOUNTER — Ambulatory Visit: Payer: Medicare PPO | Admitting: Family

## 2021-08-19 VITALS — BP 150/100 | HR 82 | Temp 96.9°F | Resp 16 | Ht 63.0 in | Wt 153.6 lb

## 2021-08-19 DIAGNOSIS — H6691 Otitis media, unspecified, right ear: Secondary | ICD-10-CM

## 2021-08-19 MED ORDER — ACETAMINOPHEN 500 MG PO TABS: 500.0000 mg | ORAL_TABLET | Freq: Four times a day (QID) | ORAL | 0 refills | Status: DC | PRN

## 2021-08-19 MED ORDER — AMOXICILLIN-POT CLAVULANATE 875-125 MG PO TABS: 1.0000 | ORAL_TABLET | Freq: Two times a day (BID) | ORAL | 0 refills | Status: DC

## 2021-08-19 NOTE — Progress Notes (Signed)
? ?Provider: Richarda Blade FNP-C ? ?Octavia Heir, NP ? ?Patient Care Team: ?Octavia Heir, NP as PCP - General (Adult Health Nurse Practitioner) ?[name], MD as Consulting Physician (Ophthalmology) ? ?Extended Emergency Contact Information ?Primary Emergency Contact: Merrily Brittle ?Address: [address] ?         [place], [place] ?Home Phone: [phone] ?Mobile Phone: [phone] ?Relation: Sister ?Secondary Emergency Contact: Merritt,Tony ? [place] ?Mobile Phone: [phone] ?Relation: Friend ? ?Code Status:  DNR ?Goals of care: Advanced Directive information ? ?  08/19/2021  ?  3:16 PM  ?Advanced Directives  ?Does Patient Have a Medical Advance Directive? Yes  ?Type of Estate agent of [place];Living will  ?Does patient want to make changes to medical advance directive? No - Patient declined  ?Copy of Healthcare Power of Attorney in Chart? Yes - validated most recent copy scanned in chart (See row information)  ? ? ? ?Chief Complaint  ?Patient presents with  ? Acute Visit  ?  Patient complains of right ear ache, eye pain, sore teeth, and fatigue that started 5 days ago.  ? ? ?HPI:  ?Pt is a 88 y.o. female seen today for an acute visit for evaluation of right ear pain,lower toothache,eye pain,sore throat and fatigue x 5 days.Has used ear drops for cerumen removal.Allergy medication helped for a while.No fever or chills. ? ? ?Past Medical History:  ?Diagnosis Date  ? Allergic rhinitis   ? Atypical chest pain 09/10/2015  ? Bronchitis, acute   ? Dyspnea on exertion   ? Exudative pleural effusion 5/09   ? sterile, benign, inflammatory  ? GERD (gastroesophageal reflux disease) 09/10/2015  ? Systolic murmur 09/10/2015  ? ?Past Surgical History:  ?Procedure Laterality Date  ? APPENDECTOMY    ? CATARACT EXTRACTION W/ INTRAOCULAR LENS IMPLANT Bilateral 12/2018  ? OOPHORECTOMY Right   ? thoracentesis left  5/09  ? ? ?Allergies  ?Allergen Reactions  ?  Statins Other (See Comments)  ?  Myalgias with crestor even when taken only a few times per week  ? Dairycare [Lactase-Lactobacillus]   ?  Stomach upset   ? Erythromycin Itching  ?  SEVERE ITCHING  ? Other Other (See Comments)  ?  NO "ACIDIC" FOODS!! They cause GI upset  ? Pollen Extract-Tree Extract [Pollen Extract]   ? Tomato Other (See Comments)  ?  Causes GI upset  ? Zyrtec [Cetirizine Hcl]   ?  Increased sleepiness  ? ? ?Outpatient Encounter Medications as of 08/19/2021  ?Medication Sig  ? Calcium Carb-Cholecalciferol (CALCIUM + D3 PO) Take 1 tablet by mouth daily.  ? carbamide peroxide (DEBROX) 6.5 % OTIC solution 5 drops as needed.  ? Docosahexaenoic Acid (OCEAN BLUE OPTIMAL HEALTH PO) Take one every other day.... OTC Striction D a natural way to support health blood sugar  ? ezetimibe (ZETIA) 10 MG tablet Take 1 tablet (10 mg total) by mouth every other day.  ? fluticasone (FLONASE) 50 MCG/ACT nasal spray Place 1 spray into both nostrils daily.  ? Glucosamine-Chondroit-Vit C-Mn (GLUCOSAMINE 1500 COMPLEX PO) Take 1 tablet by mouth daily as needed (for joint pain).  ? Glycerin-Hypromellose-PEG 400 (DRY EYE RELIEF DROPS OP) Apply to eye.  ? Misc Natural Products (FOCUSED MIND PO) Take by mouth as needed.  ? Multiple Vitamins-Minerals (MULTIVITAMIN WOMEN 50+) TABS Take 1 tablet by mouth daily.  ? Probiotic Product (UP4 PROBIOTICS PO) Take 1 tablet by mouth daily.  ? rosuvastatin (CRESTOR) 5 MG  tablet Take 2 tablets (10 mg total) by mouth every other day.  ? [DISCONTINUED] carbamide peroxide (DEBROX) 6.5 % OTIC solution Place 5 drops into the right ear 2 (two) times daily. For 7 days (Patient not taking: Reported on 07/03/2021)  ? ?No facility-administered encounter medications on file as of 08/19/2021.  ? ? ?Review of Systems  ?Constitutional:  Positive for fatigue. Negative for appetite change, chills, fever and unexpected weight change.  ?HENT:  Positive for ear pain and sore throat. Negative for congestion,  dental problem, ear discharge, facial swelling, hearing loss, nosebleeds, postnasal drip, rhinorrhea, sinus pressure, sinus pain, sneezing, tinnitus and trouble swallowing.   ?Eyes:  Negative for pain, discharge, redness, itching and visual disturbance.  ?Respiratory:  Negative for cough, chest tightness, shortness of breath and wheezing.   ?Cardiovascular:  Negative for chest pain, palpitations and leg swelling.  ?Gastrointestinal:  Negative for abdominal distention, abdominal pain, blood in stool, constipation, diarrhea, nausea and vomiting.  ?Endocrine: Negative for cold intolerance, heat intolerance, polydipsia, polyphagia and polyuria.  ?Genitourinary:  Negative for difficulty urinating, dysuria, flank pain, frequency and urgency.  ?Musculoskeletal:  Negative for arthralgias, back pain, gait problem, joint swelling, myalgias, neck pain and neck stiffness.  ?Skin:  Negative for color change, pallor, rash and wound.  ?Neurological:  Negative for dizziness, syncope, speech difficulty, weakness, light-headedness, numbness and headaches.  ?Hematological:  Does not bruise/bleed easily.  ?Psychiatric/Behavioral:  Negative for agitation, behavioral problems, confusion, hallucinations, self-injury, sleep disturbance and suicidal ideas. The patient is not nervous/anxious.   ? ?Immunization History  ?Administered Date(s) Administered  ? DTaP 11/16/2019  ? Fluad Quad(high Dose 65+) 02/23/2019, 01/02/2021  ? Influenza Split 02/02/2011, 04/03/2012  ? Influenza, High Dose Seasonal PF 03/12/2017, 04/11/2018, 03/04/2020  ? PFIZER(Purple Top)SARS-COV-2 Vaccination 06/26/2019, 07/28/2019, 02/09/2020, 10/14/2020  ? Pneumococcal Conjugate-13 04/30/2015  ? Pneumococcal Polysaccharide-23 03/09/2019  ? Tdap 11/16/2019  ? Zoster Recombinat (Shingrix) 01/24/2020, 03/25/2020  ? ?Pertinent  Health Maintenance Due  ?Topic Date Due  ? OPHTHALMOLOGY EXAM  Never done  ? INFLUENZA VACCINE  12/02/2021  ? HEMOGLOBIN A1C  12/28/2021  ? URINE  MICROALBUMIN  01/02/2022  ? FOOT EXAM  07/04/2022  ? DEXA SCAN  Completed  ? ? ?  06/27/2020  ?  2:58 PM 11/13/2020  ?  2:14 PM 01/01/2021  ?  4:55 PM 07/03/2021  ?  3:39 PM 08/19/2021  ?  3:16 PM  ?Fall Risk  ?Falls in the past year? 0 0 0 0 0  ?Was there an injury with Fall? 0 0 0 0 0  ?Fall Risk Category Calculator 0 0 0 0 0  ?Fall Risk Category Low Low Low Low Low  ?Patient Fall Risk Level Low fall risk Low fall risk Low fall risk Low fall risk Low fall risk  ?Patient at Risk for Falls Due to  No Fall Risks No Fall Risks No Fall Risks No Fall Risks  ?Fall risk Follow up  Falls evaluation completed Falls evaluation completed Falls evaluation completed Falls evaluation completed  ? ?Functional Status Survey: ?  ? ?Vitals:  ? 08/19/21 1506  ?BP: (!) 150/100  ?Pulse: 82  ?Resp: 16  ?Temp: (!) 96.9 ?F (36.1 ?C)  ?SpO2: 93%  ?Weight: 153 lb 9.6 oz (69.7 kg)  ?Height: 5\' 3"  (1.6 m)  ? ?Body mass index is 27.21 kg/m?[place] Kitchen. ?Physical Exam ?Vitals reviewed.  ?Constitutional:   ?   General: She is not in acute distress. ?   Appearance: Normal appearance. She is normal weight. She  is not ill-appearing or diaphoretic.  ?HENT:  ?   Head: Normocephalic.  ?   Right Ear: Ear canal and external ear normal. No drainage, swelling or tenderness. No middle ear effusion. There is no impacted cerumen. Tympanic membrane is injected.  ?   Left Ear: Tympanic membrane, ear canal and external ear normal. There is no impacted cerumen.  ?   Nose: Nose normal. No congestion or rhinorrhea.  ?   Mouth/Throat:  ?   Mouth: Mucous membranes are moist.  ?   Pharynx: Oropharynx is clear. No oropharyngeal exudate or posterior oropharyngeal erythema.  ?Eyes:  ?   General: No scleral icterus.    ?   Right eye: No discharge.     ?   Left eye: No discharge.  ?   Extraocular Movements: Extraocular movements intact.  ?   Conjunctiva/sclera: Conjunctivae normal.  ?   Pupils: Pupils are equal, round, and reactive to light.  ?Neck:  ?   Vascular: No carotid bruit.  ?    Comments: Right tonsillar area tender to palpation  ?Cardiovascular:  ?   Rate and Rhythm: Normal rate and regular rhythm.  ?   Pulses: Normal pulses.  ?   Heart sounds: Normal heart sounds. No murmur heard. ?  No fr

## 2021-11-13 ENCOUNTER — Other Ambulatory Visit: Payer: Self-pay | Admitting: Orthopedic Surgery

## 2021-11-13 DIAGNOSIS — R030 Elevated blood-pressure reading, without diagnosis of hypertension: Secondary | ICD-10-CM

## 2021-11-13 DIAGNOSIS — E1140 Type 2 diabetes mellitus with diabetic neuropathy, unspecified: Secondary | ICD-10-CM

## 2021-11-13 DIAGNOSIS — K219 Gastro-esophageal reflux disease without esophagitis: Secondary | ICD-10-CM

## 2021-11-13 DIAGNOSIS — E782 Mixed hyperlipidemia: Secondary | ICD-10-CM

## 2021-11-20 ENCOUNTER — Encounter: Payer: Self-pay | Admitting: Orthopedic Surgery

## 2021-11-20 ENCOUNTER — Telehealth: Payer: Self-pay

## 2021-11-20 ENCOUNTER — Ambulatory Visit (INDEPENDENT_AMBULATORY_CARE_PROVIDER_SITE_OTHER): Payer: Medicare PPO | Admitting: Orthopedic Surgery

## 2021-11-20 DIAGNOSIS — Z0000 Encounter for general adult medical examination without abnormal findings: Secondary | ICD-10-CM | POA: Diagnosis not present

## 2021-11-20 NOTE — Telephone Encounter (Addendum)
Ms. isatou, agredano are scheduled for a virtual visit with your provider today.    Just as we do with appointments in the office, we must obtain your consent to participate.  Your consent will be active for this visit and any virtual visit you may have with one of our providers in the next 365 days.    If you have a MyChart account, I can also send a copy of this consent to you electronically.  All virtual visits are billed to your insurance company just like a traditional visit in the office.  As this is a virtual visit, video technology does not allow for your provider to perform a traditional examination.  This may limit your provider's ability to fully assess your condition.  If your provider identifies any concerns that need to be evaluated in person or the need to arrange testing such as labs, EKG, etc, we will make arrangements to do so.    Although advances in technology are sophisticated, we cannot ensure that it will always work on either your end or our end.  If the connection with a video visit is poor, we may have to switch to a telephone visit.  With either a video or telephone visit, we are not always able to ensure that we have a secure connection.   I need to obtain your verbal consent now.   Are you willing to proceed with your visit today?   Shahida Oveda Dadamo has provided verbal consent on 11/20/2021 for a virtual visit (video or telephone).  Marcelina Morel, [place]  11/20/21 3:30 PM

## 2021-11-20 NOTE — Progress Notes (Signed)
Subjective:   Sara Charles is a 88 y.o. female who presents for Medicare Annual (Subsequent) preventive examination.   Place of Service: [facility] Provider: [name], AGNP-C   Review of Systems     Cardiac Risk Factors include: advanced age (>87men, >25 women);sedentary lifestyle;diabetes mellitus     Objective:    There were no vitals filed for this visit. There is no height or weight on file to calculate BMI.     11/20/2021    9:45 AM 08/19/2021    3:16 PM 07/03/2021    3:39 PM 07/02/2021    3:28 PM 01/01/2021    4:56 PM 11/13/2020    2:15 PM 09/24/2020    1:54 PM  Advanced Directives  Does Patient Have a Medical Advance Directive? Yes Yes  Yes Yes  Yes  Type of Estate agent of [place];Living will Healthcare Power of [place];Living will Healthcare Power of [place];Living will Healthcare Power of [place];Living will Healthcare Power of [employer] Power of [employer] Power of Attorney  Does patient want to make changes to medical advance directive? No - Patient declined No - Patient declined No - Patient declined No - Patient declined No - Patient declined No - Patient declined No - Patient declined  Copy of Healthcare Power of Attorney in Chart? Yes - validated most recent copy scanned in chart (See row information) Yes - validated most recent copy scanned in chart (See row information) Yes - validated most recent copy scanned in chart (See row information) Yes - validated most recent copy scanned in chart (See row information) Yes - validated most recent copy scanned in chart (See row information) Yes - validated most recent copy scanned in chart (See row information) Yes - validated most recent copy scanned in chart (See row information)    Current Medications (verified) Outpatient Encounter Medications as of 11/20/2021  Medication Sig   Calcium Carb-Cholecalciferol (CALCIUM + D3 PO) Take 1 tablet by mouth daily.    Docosahexaenoic Acid (OCEAN BLUE OPTIMAL HEALTH PO) Take one every other day.... OTC Striction D a natural way to support health blood sugar   ezetimibe (ZETIA) 10 MG tablet Take 1 tablet (10 mg total) by mouth every other day.   fluticasone (FLONASE) 50 MCG/ACT nasal spray Place 1 spray into both nostrils daily.   Glucosamine-Chondroit-Vit C-Mn (GLUCOSAMINE 1500 COMPLEX PO) Take 1 tablet by mouth daily as needed (for joint pain).   Misc Natural Products (FOCUSED MIND PO) Take by mouth as needed.   Multiple Vitamins-Minerals (MULTIVITAMIN WOMEN 50+) TABS Take 1 tablet by mouth daily.   Probiotic Product (UP4 PROBIOTICS PO) Take 1 tablet by mouth daily.   Propylene Glycol (SYSTANE BALANCE OP) Apply to eye daily.   rosuvastatin (CRESTOR) 5 MG tablet Take 2 tablets (10 mg total) by mouth every other day.   [DISCONTINUED] acetaminophen (TYLENOL) 500 MG tablet Take 1 tablet (500 mg total) by mouth every 6 (six) hours as needed.   [DISCONTINUED] amoxicillin-clavulanate (AUGMENTIN) 875-125 MG tablet Take 1 tablet by mouth 2 (two) times daily.   [DISCONTINUED] carbamide peroxide (DEBROX) 6.5 % OTIC solution 5 drops as needed.   [DISCONTINUED] Glycerin-Hypromellose-PEG 400 (DRY EYE RELIEF DROPS OP) Apply to eye.   No facility-administered encounter medications on file as of 11/20/2021.    Allergies (verified) Statins, Dairycare [lactase-lactobacillus], Erythromycin, Other, Pollen extract-tree extract [pollen extract], Tomato, and Zyrtec [cetirizine hcl]   History: Past Medical History:  Diagnosis Date   Allergic rhinitis  Atypical chest pain 09/10/2015   Bronchitis, acute    Dyspnea on exertion    Exudative pleural effusion 5/09    sterile, benign, inflammatory   GERD (gastroesophageal reflux disease) 09/10/2015   Systolic murmur 09/10/2015   Past Surgical History:  Procedure Laterality Date   APPENDECTOMY     CATARACT EXTRACTION W/ INTRAOCULAR LENS IMPLANT Bilateral 12/2018   OOPHORECTOMY  Right    thoracentesis left  5/09   Family History  Problem Relation Age of Onset   Allergies Mother    Heart disease Mother [id number]   Cancer Mother    Dementia Mother    Heart disease Father [id number]   Thyroid cancer Sister    Dementia Sister [id number]   Social History   Socioeconomic History   Marital status: Single    Spouse name: Not on file   Number of children: Not on file   Years of education: Not on file   Highest education level: Not on file  Occupational History   Occupation: retired    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       Tobacco use, amount per day now: N/A   Past tobacco use, amount per day: N/A   How many years did you use tobacco: N/A   Alcohol use (drinks per week): N/A   Diet:   Do you drink/eat things with caffeine: CHOCOLATE   Marital status:   SINGLE                               What year were you married?   Do you live in a house, apartment, assisted living, condo, trailer, etc.? HOUSE   Is it one or more stories? ONE   How many persons live in your home? 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            NO                      Type and how often?   Do you have a living will? YES   Do you have a DNR form?                                   If not, do you want to discuss one?   Do you have signed POA/HPOA forms?     YES                   If so, please bring to you appointment   Social Determinants of Health   Financial Resource Strain: Low Risk  (11/20/2021)   Overall Financial Resource Strain  (CARDIA)    Difficulty of Paying Living Expenses: Not hard at all  Food Insecurity: No Food Insecurity (11/20/2021)   Hunger Vital Sign    Worried About  Running Out of Food in the Last Year: Never true    Ran Out of Food in the Last Year: Never true  Transportation Needs: No Transportation Needs (11/20/2021)   PRAPARE - Administrator, Civil Service (Medical): No    Lack of Transportation (Non-Medical): No  Physical Activity: Inactive (11/20/2021)   Exercise Vital Sign    Days of Exercise per Week: 0 days    Minutes of Exercise per Session: 0 min  Stress: No Stress Concern Present (11/20/2021)   [employer] of Occupational Health - Occupational Stress Questionnaire    Feeling of Stress : Only a little  Social Connections: Moderately Isolated (11/20/2021)   Social Connection and Isolation Panel [NHANES]    Frequency of Communication with Friends and Family: Twice a week    Frequency of Social Gatherings with Friends and Family: More than three times a week    Attends Religious Services: 1 to 4 times per year    Active Member of [employer] or Organizations: No    Attends Engineer, structural: Never    Marital Status: Never married    Tobacco Counseling Counseling given: Not Answered   Clinical Intake:  Pre-visit preparation completed: No  Pain : No/denies pain     BMI - recorded: 27.1 Nutritional Status: BMI 25 -29 Overweight Nutritional Risks: None Diabetes: Yes CBG done?: No Did pt. bring in CBG monitor from home?: No  How often do you need to have someone help you when you read instructions, pamphlets, or other written materials from your doctor or pharmacy?: 2 - Rarely What is the last grade level you completed in school?: Masters degree  Diabetic?Yes  Interpreter Needed?: No      Activities of Daily Living    11/20/2021    3:42 PM  In your present state of health, do you have any difficulty performing the following activities:  Hearing? 0   Vision? 0  Difficulty concentrating or making decisions? 0  Walking or climbing stairs? 0  Dressing or bathing? 0  Doing errands, shopping? 0  Preparing Food and eating ? N  Using the Toilet? N  In the past six months, have you accidently leaked urine? Y  Do you have problems with loss of bowel control? N  Managing your Medications? N  Managing your Finances? N  Housekeeping or managing your Housekeeping? N    Patient Care Team: Octavia Heir, NP as PCP - General (Adult Health Nurse Practitioner) [name], MD as Consulting Physician (Ophthalmology)  Indicate any recent Medical Services you may have received from other than [facility] providers in the past year (date may be approximate).     Assessment:   This is a routine wellness examination for Jenasia.  Hearing/Vision screen No results found.  Dietary issues and exercise activities discussed: Current Exercise Habits: The patient does not participate in regular exercise at present, Exercise limited by: None identified   Goals Addressed             This Visit's Progress    Maintain Mobility and Function   On track    Evidence-based guidance:  Emphasize the importance of physical activity and aerobic exercise as included in treatment plan; assess barriers to adherence; consider patient's abilities and preferences.  Encourage gradual increase in activity or exercise instead of stopping if pain occurs.  Reinforce individual therapy exercise prescription, such as strengthening, stabilization and stretching programs.  Promote optimal body mechanics to stabilize the spine with lifting and functional  activity.  Encourage activity and mobility modifications to facilitate optimal function, such as using a log roll for bed mobility or dressing from a seated position.  Reinforce individual adaptive equipment recommendations to limit excessive spinal movements, such as a Event organiser.  Assess adequacy of sleep; encourage use  of sleep hygiene techniques, such as bedtime routine; use of white noise; dark, cool bedroom; avoiding daytime naps, heavy meals or exercise before bedtime.  Promote positions and modification to optimize sleep and sexual activity; consider pillows or positioning devices to assist in maintaining neutral spine.  Explore options for applying ergonomic principles at work and home, such as frequent position changes, using ergonomically designed equipment and working at optimal height.  Promote modifications to increase comfort with driving such as lumbar support, optimizing seat and steering wheel position, using cruise control and taking frequent rest stops to stretch and walk.   Notes:        Depression Screen    11/20/2021    3:23 PM 11/13/2020    2:12 PM 06/27/2020    2:59 PM 12/25/2019    3:09 PM 11/09/2019   11:07 AM 10/20/2018    2:04 PM 09/12/2018    2:39 PM  PHQ 2/9 Scores  PHQ - 2 Score 0 0 0 0 0 0 0    Fall Risk    11/20/2021    3:42 PM 11/20/2021    3:22 PM 08/19/2021    3:16 PM 07/03/2021    3:39 PM 01/01/2021    4:55 PM  Fall Risk   Falls in the past year? 0 0 0 0 0  Number falls in past yr: 0 0 0 0 0  Injury with Fall? 0 0 0 0 0  Risk for fall due to : No Fall Risks No Fall Risks No Fall Risks No Fall Risks No Fall Risks  Follow up Falls evaluation completed;Education provided;Falls prevention discussed Falls evaluation completed Falls evaluation completed Falls evaluation completed Falls evaluation completed    FALL RISK PREVENTION PERTAINING TO THE HOME:  Any stairs in or around the home? Yes  If so, are there any without handrails? Yes  Home free of loose throw rugs in walkways, pet beds, electrical cords, etc? Yes  Adequate lighting in your home to reduce risk of falls? Yes   ASSISTIVE DEVICES UTILIZED TO PREVENT FALLS:  Life alert? No  Use of a cane, walker or w/c? No  Grab bars in the bathroom? Yes  Shower chair or bench in shower? No  Elevated toilet seat or a  handicapped toilet? Yes   TIMED UP AND GO:  Was the test performed? No .  Length of time to ambulate 10 feet: N/A sec.   Gait slow and steady without use of assistive device  Cognitive Function:        11/20/2021    3:23 PM 11/13/2020    2:15 PM 11/09/2019   11:12 AM  6CIT Screen  What Year? 0 points 0 points 0 points  What month? 0 points 0 points 0 points  What time? 0 points 0 points 0 points  Count back from 20 0 points 0 points 0 points  Months in reverse 0 points 0 points 0 points  Repeat phrase 2 points 2 points 2 points  Total Score 2 points 2 points 2 points    Immunizations Immunization History  Administered Date(s) Administered   DTaP 11/16/2019   Fluad Quad(high Dose 65+) 02/23/2019, 01/02/2021   Influenza Split 02/02/2011, 04/03/2012  Influenza, High Dose Seasonal PF 03/12/2017, 04/11/2018, 03/04/2020   PFIZER(Purple Top)SARS-COV-2 Vaccination 06/26/2019, 07/28/2019, 02/09/2020, 10/14/2020   Pneumococcal Conjugate-13 04/30/2015   Pneumococcal Polysaccharide-23 03/09/2019   Tdap 11/16/2019   Zoster Recombinat (Shingrix) 01/24/2020, 03/25/2020    TDAP status: Up to date  Flu Vaccine status: Up to date  Pneumococcal vaccine status: Up to date  Covid-19 vaccine status: Completed vaccines  Qualifies for Shingles Vaccine? Yes   Zostavax completed No   Shingrix Completed?: Yes  Screening Tests Health Maintenance  Topic Date Due   OPHTHALMOLOGY EXAM  Never done   URINE MICROALBUMIN  01/02/2022   COVID-19 Vaccine (5 - Pfizer series) 03/31/2022 (Originally 12/09/2020)   INFLUENZA VACCINE  12/02/2021   HEMOGLOBIN A1C  12/28/2021   FOOT EXAM  07/04/2022   TETANUS/TDAP  11/15/2029   Pneumonia Vaccine 11+ Years old  Completed   DEXA SCAN  Completed   Zoster Vaccines- Shingrix  Completed   HPV VACCINES  Aged Out    Health Maintenance  Health Maintenance Due  Topic Date Due   OPHTHALMOLOGY EXAM  Never done   URINE MICROALBUMIN  01/02/2022     Colorectal cancer screening: No longer required.   Mammogram status: Completed 2017. Repeat every year  Bone Density status: Completed 02/2020. Results reflect: Bone density results: NORMAL. Repeat every 2 years.  Lung Cancer Screening: (Low Dose CT Chest recommended if Age 15-80 years, 30 pack-year currently smoking OR have quit w/in 15years.) does not qualify.   Lung Cancer Screening Referral: No  Additional Screening:  Hepatitis C Screening: does not qualify; Completed   Vision Screening: Recommended annual ophthalmology exams for early detection of glaucoma and other disorders of the eye. Is the patient up to date with their annual eye exam?  Yes  Who is the provider or what is the name of the office in which the patient attends annual eye exams? Dr. Abran Duke If pt is not established with a provider, would they like to be referred to a provider to establish care? No .   Dental Screening: Recommended annual dental exams for proper oral hygiene  Community Resource Referral / Chronic Care Management: CRR required this visit?  No   CCM required this visit?  No      Plan:     I have personally reviewed and noted the following in the patient's chart:   Medical and social history Use of alcohol, tobacco or illicit drugs  Current medications and supplements including opioid prescriptions.  Functional ability and status Nutritional status Physical activity Advanced directives List of other physicians Hospitalizations, surgeries, and ER visits in previous 12 months Vitals Screenings to include cognitive, depression, and falls Referrals and appointments  In addition, I have reviewed and discussed with patient certain preventive protocols, quality metrics, and best practice recommendations. A written personalized care plan for preventive services as well as general preventive health recommendations were provided to patient.  Telephone Note   I connected with Jema  Crall by telephone and verified that I am speaking with the correct person using two identifiers.   Location: [facility] Patient: Morgaine Kimball Provider: [name], AGNP-C    I discussed the limitations, risks, security and privacy concerns of performing an evaluation and management service by telephone and the availability of in person appointments. I also discussed with the patient that there may be a patient responsible charge related to this service. The patient expressed understanding and agreed to proceed.    I discussed the assessment and treatment  plan with the patient. The patient was provided an opportunity to ask questions and all were answered. The patient agreed with the plan and demonstrated an understanding of the instructions.     The patient was advised to call back or seek an in-person evaluation if the symptoms worsen or if the condition fails to improve as anticipated.   I provided 13 minutes of non-face-to-face time during this encounter.   [name], AGNP  Avs printed and mailed       Octavia Heir, NP   11/20/2021

## 2021-11-20 NOTE — Patient Instructions (Signed)
  Sara Charles , Thank you for taking time to come for your Medicare Wellness Visit. I appreciate your ongoing commitment to your health goals. Please review the following plan we discussed and let me know if I can assist you in the future.   These are the goals we discussed:  Goals      Maintain Mobility and Function     Evidence-based guidance:  Emphasize the importance of physical activity and aerobic exercise as included in treatment plan; assess barriers to adherence; consider patient's abilities and preferences.  Encourage gradual increase in activity or exercise instead of stopping if pain occurs.  Reinforce individual therapy exercise prescription, such as strengthening, stabilization and stretching programs.  Promote optimal body mechanics to stabilize the spine with lifting and functional activity.  Encourage activity and mobility modifications to facilitate optimal function, such as using a log roll for bed mobility or dressing from a seated position.  Reinforce individual adaptive equipment recommendations to limit excessive spinal movements, such as a Event organiser.  Assess adequacy of sleep; encourage use of sleep hygiene techniques, such as bedtime routine; use of white noise; dark, cool bedroom; avoiding daytime naps, heavy meals or exercise before bedtime.  Promote positions and modification to optimize sleep and sexual activity; consider pillows or positioning devices to assist in maintaining neutral spine.  Explore options for applying ergonomic principles at work and home, such as frequent position changes, using ergonomically designed equipment and working at optimal height.  Promote modifications to increase comfort with driving such as lumbar support, optimizing seat and steering wheel position, using cruise control and taking frequent rest stops to stretch and walk.   Notes:      Patient Stated     Maintain current level of health         This is a list of the  screening recommended for you and due dates:  Health Maintenance  Topic Date Due   Eye exam for diabetics  Never done   Urine Protein Check  01/02/2022   COVID-19 Vaccine (5 - Pfizer series) 03/31/2022*   Flu Shot  12/02/2021   Hemoglobin A1C  12/28/2021   Complete foot exam   07/04/2022   Tetanus Vaccine  11/15/2029   Pneumonia Vaccine  Completed   DEXA scan (bone density measurement)  Completed   Zoster (Shingles) Vaccine  Completed   HPV Vaccine  Aged Out  *Topic was postponed. The date shown is not the original due date.

## 2021-11-20 NOTE — Progress Notes (Signed)
This service is provided via telemedicine  No vital signs collected/recorded due to the encounter was a telemedicine visit.   Location of patient (ex: home, work):  Home  Patient consents to a telephone visit:  Yes see telephone/video visit consent dated 11/20/21  Location of the provider (ex: office, home):  [facility]   Name of any referring provider:  n/a  Names of all persons participating in the telemedicine service and their role in the encounter:  Marcelina Morel, CMA, Amy E.Fargo, NP and patient.   Time spent on call:  9 min

## 2021-11-26 ENCOUNTER — Other Ambulatory Visit: Payer: Self-pay

## 2021-11-26 ENCOUNTER — Emergency Department (HOSPITAL_COMMUNITY): Payer: Medicare PPO

## 2021-11-26 ENCOUNTER — Encounter (HOSPITAL_COMMUNITY): Payer: Self-pay | Admitting: Emergency Medicine

## 2021-11-26 ENCOUNTER — Observation Stay (HOSPITAL_COMMUNITY)
Admission: EM | Admit: 2021-11-26 | Discharge: 2021-11-27 | Disposition: A | Discharge status: Home / Self Care | Payer: Medicare PPO | Attending: Internal Medicine | Admitting: Internal Medicine

## 2021-11-26 ENCOUNTER — Observation Stay (HOSPITAL_COMMUNITY): Payer: Medicare PPO

## 2021-11-26 DIAGNOSIS — R0902 Hypoxemia: Secondary | ICD-10-CM | POA: Diagnosis not present

## 2021-11-26 DIAGNOSIS — J9811 Atelectasis: Secondary | ICD-10-CM | POA: Diagnosis not present

## 2021-11-26 DIAGNOSIS — Z79899 Other long term (current) drug therapy: Secondary | ICD-10-CM | POA: Diagnosis not present

## 2021-11-26 DIAGNOSIS — D751 Secondary polycythemia: Secondary | ICD-10-CM | POA: Diagnosis not present

## 2021-11-26 DIAGNOSIS — R0602 Shortness of breath: Secondary | ICD-10-CM | POA: Diagnosis not present

## 2021-11-26 DIAGNOSIS — R600 Localized edema: Secondary | ICD-10-CM | POA: Insufficient documentation

## 2021-11-26 DIAGNOSIS — Z20822 Contact with and (suspected) exposure to covid-19: Secondary | ICD-10-CM | POA: Diagnosis not present

## 2021-11-26 DIAGNOSIS — R0789 Other chest pain: Secondary | ICD-10-CM | POA: Diagnosis not present

## 2021-11-26 DIAGNOSIS — R079 Chest pain, unspecified: Secondary | ICD-10-CM

## 2021-11-26 DIAGNOSIS — E119 Type 2 diabetes mellitus without complications: Secondary | ICD-10-CM | POA: Insufficient documentation

## 2021-11-26 DIAGNOSIS — R030 Elevated blood-pressure reading, without diagnosis of hypertension: Secondary | ICD-10-CM | POA: Diagnosis not present

## 2021-11-26 LAB — RESP PANEL BY RT-PCR (FLU A&B, COVID) ARPGX2
Influenza A by PCR: NEGATIVE
Influenza B by PCR: NEGATIVE
SARS Coronavirus 2 by RT PCR: NEGATIVE

## 2021-11-26 LAB — COMPREHENSIVE METABOLIC PANEL
ALT: 14 U/L (ref 0–44)
AST: 18 U/L (ref 15–41)
Albumin: 3.4 g/dL — ABNORMAL LOW (ref 3.5–5.0)
Alkaline Phosphatase: 56 U/L (ref 38–126)
Anion gap: 6 (ref 5–15)
BUN: 17 mg/dL (ref 8–23)
CO2: 24 mmol/L (ref 22–32)
Calcium: 8.9 mg/dL (ref 8.9–10.3)
Chloride: 108 mmol/L (ref 98–111)
Creatinine, Ser: 0.89 mg/dL (ref 0.44–1.00)
GFR, Estimated: >60 mL/min (ref >60)
Glucose, Bld: 104 mg/dL — ABNORMAL HIGH (ref 70–99)
Potassium: 3.8 mmol/L (ref 3.5–5.1)
Sodium: 138 mmol/L (ref 135–145)
Total Bilirubin: 0.5 mg/dL (ref 0.3–1.2)
Total Protein: 5.6 g/dL — ABNORMAL LOW (ref 6.5–8.1)

## 2021-11-26 LAB — PROTIME-INR
INR: 1 (ref 0.8–1.2)
Prothrombin Time: 13.5 seconds (ref 11.4–15.2)

## 2021-11-26 LAB — APTT: aPTT: 27 seconds (ref 24–36)

## 2021-11-26 LAB — CBC
HCT: 46.4 % — ABNORMAL HIGH (ref 36.0–46.0)
Hemoglobin: 15.6 g/dL — ABNORMAL HIGH (ref 12.0–15.0)
MCH: 29.1 pg (ref 26.0–34.0)
MCHC: 33.6 g/dL (ref 30.0–36.0)
MCV: 86.6 fL (ref 80.0–100.0)
Platelets: 244 10*3/uL (ref 150–400)
RBC: 5.36 MIL/uL — ABNORMAL HIGH (ref 3.87–5.11)
RDW: 12.3 % (ref 11.5–15.5)
WBC: 6.8 10*3/uL (ref 4.0–10.5)
nRBC: 0 % (ref 0.0–0.2)

## 2021-11-26 LAB — BRAIN NATRIURETIC PEPTIDE: B Natriuretic Peptide: 7.3 pg/mL (ref 0.0–100.0)

## 2021-11-26 LAB — TROPONIN I (HIGH SENSITIVITY)
Troponin I (High Sensitivity): 4 ng/L (ref <18)
Troponin I (High Sensitivity): 5 ng/L (ref <18)

## 2021-11-26 MED ORDER — ROSUVASTATIN CALCIUM 5 MG PO TABS
10.0000 mg | ORAL_TABLET | ORAL | Status: DC
  Administered 2021-11-26: 10 mg via ORAL
  Filled 2021-11-26 (×2): qty 2

## 2021-11-26 MED ORDER — POLYVINYL ALCOHOL 1.4 % OP SOLN: 1.0000 [drp] | Freq: Every day | OPHTHALMIC | Status: DC | PRN

## 2021-11-26 MED ORDER — EZETIMIBE 10 MG PO TABS: 10.0000 mg | ORAL_TABLET | ORAL | Status: DC

## 2021-11-26 MED ORDER — ONDANSETRON HCL 4 MG/2ML IJ SOLN: 4.0000 mg | Freq: Four times a day (QID) | INTRAMUSCULAR | Status: DC | PRN

## 2021-11-26 MED ORDER — IOHEXOL 350 MG/ML SOLN
66.0000 mL | Freq: Once | INTRAVENOUS | Status: AC | PRN
  Administered 2021-11-26: 66 mL via INTRAVENOUS

## 2021-11-26 MED ORDER — SACCHAROMYCES BOULARDII 250 MG PO CAPS
250.0000 mg | ORAL_CAPSULE | Freq: Every day | ORAL | Status: DC
  Administered 2021-11-26: 250 mg via ORAL
  Filled 2021-11-26 (×2): qty 1

## 2021-11-26 MED ORDER — ALUM & MAG HYDROXIDE-SIMETH 200-200-20 MG/5ML PO SUSP
30.0000 mL | ORAL | Status: DC | PRN
  Administered 2021-11-26: 30 mL via ORAL
  Filled 2021-11-26: qty 30

## 2021-11-26 MED ORDER — EZETIMIBE 10 MG PO TABS
10.0000 mg | ORAL_TABLET | ORAL | Status: DC
Start: 2021-11-26 — End: 2021-11-27
  Administered 2021-11-26: 10 mg via ORAL
  Filled 2021-11-26 (×2): qty 1

## 2021-11-26 MED ORDER — HYDRALAZINE HCL 10 MG PO TABS: 10.0000 mg | ORAL_TABLET | Freq: Four times a day (QID) | ORAL | Status: DC | PRN

## 2021-11-26 MED ORDER — ACETAMINOPHEN 325 MG PO TABS
650.0000 mg | ORAL_TABLET | ORAL | Status: DC | PRN
Start: 2021-11-26 — End: 2021-11-27

## 2021-11-26 MED ORDER — HEPARIN SODIUM (PORCINE) 5000 UNIT/ML IJ SOLN
5000.0000 [IU] | Freq: Two times a day (BID) | INTRAMUSCULAR | Status: DC
Start: 2021-11-26 — End: 2021-11-27
  Administered 2021-11-26 (×2): 5000 [IU] via SUBCUTANEOUS
  Filled 2021-11-26 (×2): qty 1

## 2021-11-26 MED ORDER — FLUTICASONE PROPIONATE 50 MCG/ACT NA SUSP
1.0000 | Freq: Every day | NASAL | Status: DC
  Administered 2021-11-27: 1 via NASAL
  Filled 2021-11-26: qty 16

## 2021-11-26 NOTE — ED Triage Notes (Signed)
Pt bib ems from home; called out for CP, radiating to posterior and anterior L shoulder, upper chest that began this am; 1 similar episode past few weeks, resolved on own; endorses increased lethargy past few weeks;denies sob, endorses swelling in ankles; 202/104 initially, no hx of htn; 90% RA; lung sounds decrease throughout; 324 asa, 1 nitro given; sats decreased 83% after nitro given, on 6L; pt 87% 15L on arrival to ED; EDP at bedside, RT notified; 20 GA RAC; denies cough, N/V/D

## 2021-11-26 NOTE — H&P (Signed)
History and Physical    Sara Charles JJO:[id number] DOB: 12/13/1933 DOA: 11/26/2021  PCP: Octavia Heir, NP (Confirm with patient/family/NH records and if not entered, this has to be entered at [facility] point of entry) Patient coming from: Home  I have personally briefly reviewed patient's old medical records in [facility]  Chief Complaint: Chest pain  HPI: Sara Charles is a 88 y.o. female with medical history significant of HLD, came with new onset of chest pain.  Symptoms started last night, with dull like, chest pain 3/10, on the left side of chest, radiating to left shoulder, worsening with upper torso movement.  Pain has been constant, denies any shortness of breath, no lightheadedness no nauseous, no palpitations.  Pain lasted about 3 to 4 hours through this morning, and patient decided to call EMS.  EMS arrived and found patient blood pressure elevated SBP more than 200.  And patient was also found to be hypoxic O2 saturation 87%.  Chest pain somewhat improved with nitroglycerin but hypoxia persisted.  Patient reported recently bilateral ankle swelling for 28-month, went to see PCP, was found blood pressure elevated but later normalized during the same visit and PCP decided not to start patient on BP meds.  Instead, patient purchased a home BP Cuff, and used to check blood pressure 1-2 times a week and found blood pressure varies with SBP 120s-140s.  Patient also suspect she has sleep apnea but was never diagnosed.  ED Course: Blood pressure elevated slightly 140/90, profound hypoxia placed on nonrebreather 10 L did not titrated to 4 L with O2 saturation 87%.  Chest pain persisted, CT angiogram negative for PE.  Hemoglobin 15.6.  Troponin negative x1.  Review of Systems: As per HPI otherwise 14 point review of systems negative.    Past Medical History:  Diagnosis Date   Allergic rhinitis    Atypical chest pain 09/10/2015   Bronchitis, acute    Dyspnea on exertion     Exudative pleural effusion 5/09    sterile, benign, inflammatory   GERD (gastroesophageal reflux disease) 09/10/2015   Systolic murmur 09/10/2015    Past Surgical History:  Procedure Laterality Date   APPENDECTOMY     CATARACT EXTRACTION W/ INTRAOCULAR LENS IMPLANT Bilateral 12/2018   OOPHORECTOMY Right    thoracentesis left  5/09     reports that she has never smoked. She has never used smokeless tobacco. She reports that she does not drink alcohol and does not use drugs.  Allergies  Allergen Reactions   Statins Other (See Comments)    Myalgias with crestor even when taken only a few times per week   Dairycare [Lactase-Lactobacillus]     Stomach upset    Erythromycin Itching    SEVERE ITCHING   Other Other (See Comments)    NO "ACIDIC" FOODS!! They cause GI upset   Pollen Extract-Tree Extract [Pollen Extract]    Tomato Other (See Comments)    Causes GI upset   Zyrtec [Cetirizine Hcl]     Increased sleepiness    Family History  Problem Relation Age of Onset   Allergies Mother    Heart disease Mother 37   Cancer Mother    Dementia Mother    Heart disease Father 59   Thyroid cancer Sister    Dementia Sister 81     Prior to Admission medications   Medication Sig Start Date End Date Taking? Authorizing Provider  Calcium Carb-Cholecalciferol (CALCIUM + D3 PO) Take 1 tablet by mouth  daily.   Yes [provider]  ezetimibe (ZETIA) 10 MG tablet Take 1 tablet (10 mg total) by mouth every other day. 07/03/21  Yes Sharon Seller, NP  fluticasone (FLONASE) 50 MCG/ACT nasal spray Place 1 spray into both nostrils daily. Patient taking differently: Place 1 spray into both nostrils daily as needed for allergies. 07/03/21  Yes Sharon Seller, NP  Multiple Vitamins-Minerals (MULTIVITAMIN WOMEN 50+) TABS Take 1 tablet by mouth daily.   Yes [provider]  Probiotic Product (UP4 PROBIOTICS PO) Take 1 tablet by mouth daily.   Yes [provider]  Propylene  Glycol (SYSTANE BALANCE OP) Apply 1 drop to eye daily as needed (dry eyes).   Yes [provider]  rosuvastatin (CRESTOR) 5 MG tablet Take 2 tablets (10 mg total) by mouth every other day. 07/03/21  Yes Sharon Seller, NP    Physical Exam: Vitals:   11/26/21 1130 11/26/21 1200 11/26/21 1300 11/26/21 1330  BP: (!) 156/59 (!) 142/73 (!) 145/69 (!) 175/91  Pulse: 67 70 65 69  Resp: 18 (!) 23 16 11   Temp:      TempSrc:      SpO2: 97% 95% 98% 97%    Constitutional: NAD, calm, comfortable Vitals:   11/26/21 1130 11/26/21 1200 11/26/21 1300 11/26/21 1330  BP: (!) 156/59 (!) 142/73 (!) 145/69 (!) 175/91  Pulse: 67 70 65 69  Resp: 18 (!) 23 16 11   Temp:      TempSrc:      SpO2: 97% 95% 98% 97%   Eyes: PERRL, lids and conjunctivae normal ENMT: Mucous membranes are moist. Posterior pharynx clear of any exudate or lesions.Normal dentition.  Neck: normal, supple, no masses, no thyromegaly Respiratory: clear to auscultation bilaterally, no wheezing, no crackles. Normal respiratory effort. No accessory muscle use. Tenderness on the left front rib cage Cardiovascular: Regular rate and rhythm, no murmurs / rubs / gallops. No extremity edema. 2+ pedal pulses. No carotid bruits.  Abdomen: no tenderness, no masses palpated. No hepatosplenomegaly. Bowel sounds positive.  Musculoskeletal: no clubbing / cyanosis. No joint deformity upper and lower extremities. Good ROM, no contractures. Normal muscle tone.  Skin: no rashes, lesions, ulcers. No induration Neurologic: CN 2-12 grossly intact. Sensation intact, DTR normal. Strength 5/5 in all 4.  Psychiatric: Normal judgment and insight. Alert and oriented x 3. Normal mood.     Labs on Admission: I have personally reviewed following labs and imaging studies  CBC: Recent Labs  Lab 11/26/21 0828  WBC 6.8  HGB 15.6*  HCT 46.4*  MCV 86.6  PLT 244   Basic Metabolic Panel: Recent Labs  Lab 11/26/21 1050  NA 138  K 3.8  CL 108  CO2  24  GLUCOSE 104*  BUN 17  CREATININE 0.89  CALCIUM 8.9   GFR: CrCl cannot be calculated (Unknown ideal weight.). Liver Function Tests: Recent Labs  Lab 11/26/21 1050  AST 18  ALT 14  ALKPHOS 56  BILITOT 0.5  PROT 5.6*  ALBUMIN 3.4*   No results for input(s): "LIPASE", "AMYLASE" in the last 168 hours. No results for input(s): "AMMONIA" in the last 168 hours. Coagulation Profile: Recent Labs  Lab 11/26/21 0828  INR 1.0   Cardiac Enzymes: No results for input(s): "CKTOTAL", "CKMB", "CKMBINDEX", "TROPONINI" in the last 168 hours. BNP (last 3 results) No results for input(s): "PROBNP" in the last 8760 hours. HbA1C: No results for input(s): "HGBA1C" in the last 72 hours. CBG: No results for input(s): "GLUCAP"  in the last 168 hours. Lipid Profile: No results for input(s): "CHOL", "HDL", "LDLCALC", "TRIG", "CHOLHDL", "LDLDIRECT" in the last 72 hours. Thyroid Function Tests: No results for input(s): "TSH", "T4TOTAL", "FREET4", "T3FREE", "THYROIDAB" in the last 72 hours. Anemia Panel: No results for input(s): "VITAMINB12", "FOLATE", "FERRITIN", "TIBC", "IRON", "RETICCTPCT" in the last 72 hours. Urine analysis: No results found for: "COLORURINE", "APPEARANCEUR", "LABSPEC", "PHURINE", "GLUCOSEU", "HGBUR", "BILIRUBINUR", "KETONESUR", "PROTEINUR", "UROBILINOGEN", "NITRITE", "LEUKOCYTESUR"  Radiological Exams on Admission: CT Angio Chest PE W and/or Wo Contrast  Result Date: 11/26/2021 CLINICAL DATA:  Pulmonary embolism (PE) suspected, high prob EXAM: CT ANGIOGRAPHY CHEST WITH CONTRAST TECHNIQUE: Multidetector CT imaging of the chest was performed using the standard protocol during bolus administration of intravenous contrast. Multiplanar CT image reconstructions and MIPs were obtained to evaluate the vascular anatomy. RADIATION DOSE REDUCTION: This exam was performed according to the departmental dose-optimization program which includes automated exposure control, adjustment of the  mA and/or kV according to patient size and/or use of iterative reconstruction technique. CONTRAST:  86mL OMNIPAQUE IOHEXOL 350 MG/ML SOLN COMPARISON:  Chest CT 04/06/2005. CT abdomen 06/18/2016. Chest x-ray 11/26/2021 FINDINGS: Cardiovascular: Satisfactory opacification of the pulmonary arteries to the segmental level. No evidence of pulmonary embolism. Thoracic aorta is nonaneurysmal. Scattered atherosclerotic vascular calcifications of the aorta and coronary arteries. Normal heart size. No pericardial effusion. Mediastinum/Nodes: No enlarged mediastinal, hilar, or axillary lymph nodes. Thyroid gland, trachea, and esophagus demonstrate no significant findings. Small hiatal hernia. Lungs/Pleura: Mild dependent subsegmental atelectasis. Lungs are otherwise clear. No pleural effusion or pneumothorax. Upper Abdomen: 2.0 cm left adrenal gland nodule with internal density of 41 HU, stable from 2018. No acute findings within the included upper abdomen. Musculoskeletal: No chest wall abnormality. No acute or significant osseous findings. Degenerative changes of both glenohumeral joints. Review of the MIP images confirms the above findings. IMPRESSION: 1. Negative for pulmonary embolism or other acute intrathoracic findings. 2. Small hiatal hernia. 3. Benign 2.0 cm left adrenal gland nodule, stable from 2018 and likely representing an adenoma. 4. Aortic atherosclerosis (ICD10-I70.0). Electronically Signed   By: Duanne Guess D.O.   On: 11/26/2021 10:46   DG Chest Port 1 View  Result Date: 11/26/2021 CLINICAL DATA:  88 year old female with chest pain and shortness of breath. EXAM: PORTABLE CHEST 1 VIEW COMPARISON:  Chest radiographs 09/11/2017 and earlier. FINDINGS: Portable AP upright view at 0841 hours. Borderline to mildly hyperinflated lung volumes. Normal cardiac size and mediastinal contours. Visualized tracheal air column is within normal limits. Allowing for portable technique the lungs are clear. No  pneumothorax or pleural effusion. No acute osseous abnormality identified. IMPRESSION: No acute cardiopulmonary abnormality. Electronically Signed   By: Odessa Fleming M.D.   On: 11/26/2021 08:46    EKG: Independently reviewed.  Sinus, first-degree AV block, no significant ST-T changes.  Assessment/Plan Principal Problem:   Chest pain Active Problems:   Atypical chest pain  (please populate well all problems here in Problem List. (For example, if patient is on BP meds at home and you resume or decide to hold them, it is a problem that needs to be her. Same for CAD, COPD, HLD and so on)  Atypical chest pain -ACS ruled out, PE ruled out. -Cause for chest pain appears to be costochondritis given significant tenderness locally. -Echocardiogram, if no significant focal wall motion abnormality, expect patient can be discharged home and follow-up with outpatient cardiology for possible stress test. -Risk factor control, there is a question of undiagnosed hypertension, recommend ambulatory BP monitoring.  Explained to the patient, who expressed understanding and agreed.  She probably also has a undiagnosed OSA, which she agrees to consult outpatient pulmonary after discharge.  Acute hypoxia -Improving, titrated to 2 L with O2 saturation 96%. -No clear etiology, denies any respiratory symptoms such as cough wheezing or shortness of breath.  CT angiogram negative for PE. -Echo to rule out pulmonary hypertension, outpatient sleep study to rule out OSA. -Ambulatory pulse ox x1 tomorrow morning.  Elevated BP without diagnosis of HTN -As needed hydralazine, ambulatory BP monitoring outpatient.  Polycythemia -No clear etiology but suspected OSA.  Repeat H&H tomorrow, outpatient pulmonary follow-up for sleep study.  HLD -Continue statin and Zetia   DVT prophylaxis: Heparin subcu Code Status: Full code Family Communication: None at bedside Disposition Plan: Expect less than 2 midnight hospital  stay Consults called: None Admission status: Telemetry observation   Emeline General MD [facility] Pager [phone]  11/26/2021, 1:43 PM

## 2021-11-26 NOTE — Progress Notes (Signed)
Mobility Specialist Progress Note    11/26/21 1633  Mobility  Activity Ambulated independently in hallway  Level of Assistance Standby assist, set-up cues, supervision of patient - no hands on  Assistive Device None  Distance Ambulated (ft) 200 ft  Activity Response Tolerated well  $Mobility charge 1 Mobility   Pre-Mobility: 70 HR, 97% SpO2 Post-Mobility: 74 HR, 92% SpO2  Pt received in bed and agreeable. No complaints on walk. Maintained SpO2 >/=94% on RA. Returned to bed with call bell in reach.    [place] Mobility Specialist

## 2021-11-26 NOTE — ED Provider Notes (Signed)
[facility] EMERGENCY DEPARTMENT Provider Note   CSN: [id number] Arrival date & time: 11/26/21  0815     History  No chief complaint on file.   Sara Charles is a 88 y.o. female.  88 year old female with a history of hyperlipidemia and diabetes who presents emergency department with left shoulder discomfort and leg swelling.  Patient states that over the past several days she has noticed bilateral lower extremity swelling and fatigue.  Says that today she started having left shoulder pain that was a dull aching pain.  Says it is initially was severe but it is 3 out of 10 in severity on presentation to the emergency department after receiving aspirin and nitroglycerin by EMS.  EMS states she was satting in the low 90s on room air when they arrived but after receiving the nitroglycerin her saturations dropped into the 80s.  They noted that she was severely hypertensive into the 200s at that time as well.  She was started on nasal cannula and escalated to nonrebreather and brought into the emergency department for additional evaluation.  Patient denies any preceding fevers, cough, shortness of breath, or chest discomfort.  Does not have a history of cardiac disease.  Unsure if the left shoulder pain is exertional.  No history of DVT or PE recent travel.  No recent surgeries.        Home Medications Prior to Admission medications   Medication Sig Start Date End Date Taking? Authorizing Provider  Calcium Carb-Cholecalciferol (CALCIUM + D3 PO) Take 1 tablet by mouth daily.   Yes [provider]  ezetimibe (ZETIA) 10 MG tablet Take 1 tablet (10 mg total) by mouth every other day. 07/03/21  Yes Sharon Seller, NP  fluticasone (FLONASE) 50 MCG/ACT nasal spray Place 1 spray into both nostrils daily. Patient taking differently: Place 1 spray into both nostrils daily as needed for allergies. 07/03/21  Yes Sharon Seller, NP  Multiple Vitamins-Minerals  (MULTIVITAMIN WOMEN 50+) TABS Take 1 tablet by mouth daily.   Yes [provider]  Probiotic Product (UP4 PROBIOTICS PO) Take 1 tablet by mouth daily.   Yes [provider]  Propylene Glycol (SYSTANE BALANCE OP) Apply 1 drop to eye daily as needed (dry eyes).   Yes [provider]  rosuvastatin (CRESTOR) 5 MG tablet Take 2 tablets (10 mg total) by mouth every other day. 07/03/21  Yes Sharon Seller, NP      Allergies    Statins, Dairycare [lactase-lactobacillus], Erythromycin, Other, Pollen extract-tree extract [pollen extract], Tomato, and Zyrtec [cetirizine hcl]    Review of Systems   Review of Systems  All other systems reviewed and are negative.   Physical Exam Updated Vital Signs BP (!) 169/70 (BP Location: Left Arm)   Pulse 68   Temp 98.1 F (36.7 C) (Oral)   Resp 16   Ht 5\' 2"  (1.575 m)   Wt 69.4 kg   SpO2 97%   BMI 27.98 kg/m  Physical Exam Vitals and nursing note reviewed.  Constitutional:      Appearance: She is well-developed.     Comments: Mildly uncomfortable appearing.  No significant increased work of breathing but was satting in the low 90s on a nonrebreather.  HENT:     Head: Normocephalic and atraumatic.     Right Ear: External ear normal.     Left Ear: External ear normal.     Nose: Nose normal.     Mouth/Throat:     Mouth:  Mucous membranes are moist.     Pharynx: Oropharynx is clear.  Eyes:     Extraocular Movements: Extraocular movements intact.     Conjunctiva/sclera: Conjunctivae normal.     Pupils: Pupils are equal, round, and reactive to light.  Cardiovascular:     Rate and Rhythm: Normal rate and regular rhythm.     Pulses: Normal pulses.     Heart sounds: Normal heart sounds. No murmur heard. Pulmonary:     Breath sounds: Normal breath sounds. No stridor. No wheezing, rhonchi or rales.  Abdominal:     General: Abdomen is flat. There is no distension.     Palpations: Abdomen is soft. There is no mass.      Tenderness: There is no abdominal tenderness. There is no guarding.  Musculoskeletal:        General: No swelling.     Cervical back: Normal range of motion and neck supple.     Right lower leg: Edema (Trace) present.     Left lower leg: Edema (Trace) present.  Skin:    General: Skin is warm and dry.     Capillary Refill: Capillary refill takes less than 2 seconds.  Neurological:     General: No focal deficit present.     Mental Status: She is alert and oriented to person, place, and time. Mental status is at baseline.  Psychiatric:        Mood and Affect: Mood normal.     ED Results / Procedures / Treatments   Labs (all labs ordered are listed, but only abnormal results are displayed) Labs Reviewed  CBC - Abnormal; Notable for the following components:      Result Value   RBC 5.36 (*)    Hemoglobin 15.6 (*)    HCT 46.4 (*)    All other components within normal limits  COMPREHENSIVE METABOLIC PANEL - Abnormal; Notable for the following components:   Glucose, Bld 104 (*)    Total Protein 5.6 (*)    Albumin 3.4 (*)    All other components within normal limits  RESP PANEL BY RT-PCR (FLU A&B, COVID) ARPGX2  APTT  PROTIME-INR  BRAIN NATRIURETIC PEPTIDE  TROPONIN I (HIGH SENSITIVITY)  TROPONIN I (HIGH SENSITIVITY)    EKG EKG Interpretation  Date/Time:  Wednesday November 26 2021 08:36:05 EDT Ventricular Rate:  71 PR Interval:  234 QRS Duration: 82 QT Interval:  390 QTC Calculation: 424 R Axis:   43 Text Interpretation: Sinus rhythm Prolonged PR interval Low voltage, precordial leads Confirmed by Eber Hong ([id number]) on 11/26/2021 10:09:12 AM  Radiology CT Angio Chest PE W and/or Wo Contrast  Result Date: 11/26/2021 CLINICAL DATA:  Pulmonary embolism (PE) suspected, high prob EXAM: CT ANGIOGRAPHY CHEST WITH CONTRAST TECHNIQUE: Multidetector CT imaging of the chest was performed using the standard protocol during bolus administration of intravenous contrast. Multiplanar CT  image reconstructions and MIPs were obtained to evaluate the vascular anatomy. RADIATION DOSE REDUCTION: This exam was performed according to the departmental dose-optimization program which includes automated exposure control, adjustment of the mA and/or kV according to patient size and/or use of iterative reconstruction technique. CONTRAST:  72mL OMNIPAQUE IOHEXOL 350 MG/ML SOLN COMPARISON:  Chest CT 04/06/2005. CT abdomen 06/18/2016. Chest x-ray 11/26/2021 FINDINGS: Cardiovascular: Satisfactory opacification of the pulmonary arteries to the segmental level. No evidence of pulmonary embolism. Thoracic aorta is nonaneurysmal. Scattered atherosclerotic vascular calcifications of the aorta and coronary arteries. Normal heart size. No pericardial effusion. Mediastinum/Nodes: No enlarged mediastinal, hilar, or axillary  lymph nodes. Thyroid gland, trachea, and esophagus demonstrate no significant findings. Small hiatal hernia. Lungs/Pleura: Mild dependent subsegmental atelectasis. Lungs are otherwise clear. No pleural effusion or pneumothorax. Upper Abdomen: 2.0 cm left adrenal gland nodule with internal density of 41 HU, stable from 2018. No acute findings within the included upper abdomen. Musculoskeletal: No chest wall abnormality. No acute or significant osseous findings. Degenerative changes of both glenohumeral joints. Review of the MIP images confirms the above findings. IMPRESSION: 1. Negative for pulmonary embolism or other acute intrathoracic findings. 2. Small hiatal hernia. 3. Benign 2.0 cm left adrenal gland nodule, stable from 2018 and likely representing an adenoma. 4. Aortic atherosclerosis (ICD10-I70.0). Electronically Signed   By: Duanne Guess D.O.   On: 11/26/2021 10:46   DG Chest Port 1 View  Result Date: 11/26/2021 CLINICAL DATA:  88 year old female with chest pain and shortness of breath. EXAM: PORTABLE CHEST 1 VIEW COMPARISON:  Chest radiographs 09/11/2017 and earlier. FINDINGS: Portable  AP upright view at 0841 hours. Borderline to mildly hyperinflated lung volumes. Normal cardiac size and mediastinal contours. Visualized tracheal air column is within normal limits. Allowing for portable technique the lungs are clear. No pneumothorax or pleural effusion. No acute osseous abnormality identified. IMPRESSION: No acute cardiopulmonary abnormality. Electronically Signed   By: Odessa Fleming M.D.   On: 11/26/2021 08:46    Procedures Procedures    Medications Ordered in ED Medications  hydrALAZINE (APRESOLINE) tablet 10 mg (has no administration in time range)  rosuvastatin (CRESTOR) tablet 10 mg (10 mg Oral Given 11/26/21 1445)  fluticasone (FLONASE) 50 MCG/ACT nasal spray 1 spray (1 spray Each Nare Patient Refused/Not Given 11/26/21 1544)  polyvinyl alcohol (LIQUIFILM TEARS) 1.4 % ophthalmic solution 1 drop (has no administration in time range)  acetaminophen (TYLENOL) tablet 650 mg (has no administration in time range)  ondansetron (ZOFRAN) injection 4 mg (has no administration in time range)  heparin injection 5,000 Units (5,000 Units Subcutaneous Given 11/26/21 1445)  ezetimibe (ZETIA) tablet 10 mg (10 mg Oral Given 11/26/21 1445)  iohexol (OMNIPAQUE) 350 MG/ML injection 66 mL (66 mLs Intravenous Contrast Given 11/26/21 1036)    ED Course/ Medical Decision Making/ A&P Clinical Course as of 11/26/21 1644  Wed Nov 26, 2021  0911 Chest x-ray reviewed and interpreted by me as showing no acute abnormality. [RP]  0911 Patient reassessed.  Breathing comfortably on high flow nasal cannula.  Reports that her chest pain/left shoulder pain have resolved. [RP]  [phone] Reassessed.  Patient feels more comfortable and does not have any chest discomfort.  Says that work of breathing is improved.  Currently on 6 L/min nasal cannula. [RP]  0944 EKG reviewed and interpreted by me.  Does appear to show sinus rhythm with first-degree AV block.  Normal axis.  Normal intervals otherwise.  Patient does appear to  have S1Q3T3 pattern.  Poor R wave progression also noted in lead V3.  No additional signs of ischemia or arrhythmia. [RP]  1055 CT Angio Chest PE W and/or Wo Contrast CT reviewed and interpreted by me as showing no proximal pulmonary embolism or infiltrate to explain the patient's hypoxia. [RP]  1253 Decreased from 6L to 4L [facility]. Pt satting well on room air.  [RP]  1311 Discussed with Dr. Chipper Herb from internal medicine who will admit the patient for hypoxia of unclear etiology.  Based on her troponin and EKG do not feel that the patient is currently having ACS.  Considered flash pulmonary edema but it would be unusual for the  patient to improve so quickly without infiltrates on her x-ray.  CTA ruled out infectious causes, pneumothorax, and pulmonary embolism.  Medicine will continue the work-up for the patient. [RP]    Clinical Course User Index [RP] Rondel Baton, MD                           Medical Decision Making 88 year old female with a history of hyperlipidemia and diabetes who presented with left shoulder pain, hypoxia, and lower extremity swelling.  Initially was concerned about possible flash pulmonary edema given the patient's hypertension and symptoms that EMS reported.  However, her lung sounds are clear and chest x-ray does not show significant anemia.  Also concern for possible MI so EKG was obtained did not show evidence of STEMI but did show possible right heart strain pattern.  CTA has been ordered to evaluate for possible pulmonary embolism given the discrepancy between the patient's oxygen saturations and clear lungs on physical exam.  Does not have significant lower extremity edema at this time.  Aspirin already given by EMS.  Plan: High flow nasal cannula Labs including troponin EKG CTA Anticipate admission  Amount and/or Complexity of Data Reviewed Labs: ordered. Radiology: ordered. Decision-making details documented in ED Course.  Risk Prescription drug  management. Decision regarding hospitalization.    Final Clinical Impression(s) / ED Diagnoses Final diagnoses:  Chest pain, unspecified type  Hypoxemia    Rx / DC Orders ED Discharge Orders     None         Rondel Baton, MD 11/26/21 1644

## 2021-11-26 NOTE — Progress Notes (Signed)
SATURATION QUALIFICATIONS: (This note is used to comply with regulatory documentation for home oxygen)  Patient Saturations on Room Air at Rest = 96  Patient Saturations on Room Air while Ambulating = 94   

## 2021-11-26 NOTE — ED Notes (Signed)
Patient transported to CT 

## 2021-11-27 ENCOUNTER — Observation Stay (HOSPITAL_BASED_OUTPATIENT_CLINIC_OR_DEPARTMENT_OTHER): Payer: Medicare PPO

## 2021-11-27 DIAGNOSIS — R0789 Other chest pain: Secondary | ICD-10-CM | POA: Diagnosis not present

## 2021-11-27 DIAGNOSIS — R079 Chest pain, unspecified: Secondary | ICD-10-CM

## 2021-11-27 LAB — ECHOCARDIOGRAM COMPLETE
AR max vel: 2.95 cm2
AV Peak grad: 5.3 mmHg
Ao pk vel: 1.15 m/s
Area-P 1/2: 4.17 cm2
Calc EF: 62 %
Height: 62 in
P 1/2 time: 322 msec
S' Lateral: 2.1 cm
Single Plane A2C EF: 62.1 %
Single Plane A4C EF: 62.5 %
Weight: 2448 oz

## 2021-11-27 LAB — HEMOGLOBIN AND HEMATOCRIT, BLOOD
HCT: 39.9 % (ref 36.0–46.0)
Hemoglobin: 13.8 g/dL (ref 12.0–15.0)

## 2021-11-27 NOTE — Plan of Care (Signed)
  Problem: Clinical Measurements: Goal: Ability to maintain clinical measurements within normal limits will improve Outcome: Progressing   

## 2021-11-27 NOTE — Discharge Summary (Signed)
Physician Discharge Summary   Patient: Sara Charles MRN: [id number] DOB: 11/13/1933  Admit date:     11/26/2021  Discharge date: 11/27/21  Discharge Physician: Joycelyn Das   PCP: Octavia Heir, NP   Recommendations at discharge:   Follow-up with your primary care provider in 1 week. Patient would benefit from consideration of antihypertensives as outpatient.  She would like to discuss with PCP about it.   She might benefit from sleep apnea study as outpatient.  Discharge Diagnoses: Principal Problem:   Atypical chest pain Active Problems:   Controlled type 2 diabetes mellitus without complication, without long-term current use of insulin (HCC)  Resolved Problems:   * No resolved hospital problems. *  Hospital Course: Sara Charles is a 88 y.o. female with past medical history significant of hyperlipidemia, controlled diabetes mellitus presented to the hospital with chest pain 3/10 on the left side of the chest worse with movement of the torso.  No shortness of breath diaphoresis palpitation or dizziness.  She was however noted to have transient hypoxia and elevated blood pressure in the ED.  She also complained of some mild leg swelling.  In the ED blood pressure was 140/90 and was hypoxic.  CT angiogram was done which was negative for pulmonary embolism or acute pulmonary issue.  Hemoglobin was 15.6.  Troponins were negative.  EKG was unremarkable patient was then placed in observation for further evaluation and treatment.     Assessment and Plan:  Atypical chest pain Troponins negative.  EKG unremarkable.  CTA chest does not show pulmonary embolism.  2D echocardiogram with no focal wall motion abnormality.  Preserved ejection fraction.-Possible uncontrolled hypertension.  Patient would benefit from outpatient monitoring and initiation of antihypertensive as outpatient.     Acute hypoxia/possible obstructive sleep apnea On presentation.  Resolved.  CTA chest was  negative for PE.  Improved at this time.  On room air.  No dyspnea.  2D echocardiogram was LV function.  BNP at 7.  Troponins were negative.   Elevated BP without diagnosis of HTN Spoke with the patient regarding antihypertensives at a lower dose.  She would like to talk with her primary care physician from discharge   Polycythemia Possible sleep apnea.  Would recommend outpatient sleep study.   Hyperlipidemia -Continue statin and Zetia   Consultants: None  Procedures performed: None Disposition: Home Diet recommendation:  Discharge Diet Orders (From admission, onward)     Start     Ordered   11/27/21 0000  Diet - low sodium heart healthy        11/27/21 1429           Cardiac diet DISCHARGE MEDICATION: Allergies as of 11/27/2021       Reactions   Statins Other (See Comments)   Myalgias with crestor even when taken only a few times per week   Dairycare [lactase-lactobacillus]    Stomach upset    Erythromycin Itching   SEVERE ITCHING   Other Other (See Comments)   NO "ACIDIC" FOODS!! They cause GI upset   Pollen Extract-tree Extract [pollen Extract]    Tomato Other (See Comments)   Causes GI upset   Zyrtec [cetirizine Hcl]    Increased sleepiness        Medication List     TAKE these medications    CALCIUM + D3 PO Take 1 tablet by mouth daily.   ezetimibe 10 MG tablet Commonly known as: ZETIA Take 1 tablet (10 mg total) by mouth every  other day.   fluticasone 50 MCG/ACT nasal spray Commonly known as: FLONASE Place 1 spray into both nostrils daily. What changed:  when to take this reasons to take this   Multivitamin Women 50+ Tabs Take 1 tablet by mouth daily.   rosuvastatin 5 MG tablet Commonly known as: CRESTOR Take 2 tablets (10 mg total) by mouth every other day.   SYSTANE BALANCE OP Apply 1 drop to eye daily as needed (dry eyes).   UP4 PROBIOTICS PO Take 1 tablet by mouth daily.       Subjective: Today, patient was seen and  examined at bedside.  Patient denies any chest pain, shortness of breath, fever or chills.  Feels like she had some issues with blood pressure and ankle edema recently.  Discharge Exam: Filed Weights   11/26/21 1459  Weight: 69.4 kg  Body mass index is 27.98 kg/m.      11/27/2021    1:49 PM 11/27/2021    7:59 AM 11/27/2021    4:19 AM  Vitals with BMI  Systolic 161 165 517  Diastolic 72 63 65  Pulse 78 76 76    General:  Average built, not in obvious distress HENT:   No scleral pallor or icterus noted. Oral mucosa is moist.  Chest:  Clear breath sounds.  Diminished breath sounds bilaterally. No crackles or wheezes.  Left chest wall tenderness on palpation. CVS: S1 &S2 heard. No murmur.  Regular rate and rhythm. Abdomen: Soft, nontender, nondistended.  Bowel sounds are heard.   Extremities: No cyanosis, clubbing or edema.  Peripheral pulses are palpable.  Tenderness over the lower extremities. Psych: Alert, awake and oriented, normal mood CNS:  No cranial nerve deficits.  Power equal in all extremities.   Skin: Warm and dry.  No rashes noted.   Condition at discharge: good  The results of significant diagnostics from this hospitalization (including imaging, microbiology, ancillary and laboratory) are listed below for reference.   Imaging Studies: ECHOCARDIOGRAM COMPLETE  Result Date: 11/27/2021    ECHOCARDIOGRAM REPORT   Patient Name:   Sara Charles [facility] Date of Exam: 11/27/2021 Medical Rec #:  [id number]          Height:       62.0 in Accession #:    [id number]         Weight:       153.0 lb Date of Birth:  1934/01/03         BSA:          1.706 m Patient Age:    87 years           BP:           149/65 mmHg Patient Gender: F                  HR:           78 bpm. Exam Location:  Inpatient Procedure: 2D Echo, Cardiac Doppler and Color Doppler Indications:    Chest pain  History:        Patient has no prior history of Echocardiogram examinations.                 Risk Factors:Diabetes.   Sonographer:    Cleatis Polka Referring Phys: [id number] Emeline General  Sonographer Comments: Image acquisition challenging due to respiratory motion. IMPRESSIONS  1. Left ventricular ejection fraction, by estimation, is 60 to 65%. The left ventricle has normal function. The left ventricle has no regional wall motion  abnormalities. Left ventricular diastolic parameters are consistent with Grade I diastolic dysfunction (impaired relaxation).  2. Right ventricular systolic function is normal. The right ventricular size is normal. There is normal pulmonary artery systolic pressure. The estimated right ventricular systolic pressure is 20.6 mmHg.  3. The mitral valve is normal in structure. Trivial mitral valve regurgitation. No evidence of mitral stenosis.  4. The aortic valve is tricuspid. There is moderate calcification of the aortic valve. Aortic valve regurgitation is trivial. Aortic valve sclerosis/calcification is present, without any evidence of aortic stenosis.  5. The inferior vena cava is normal in size with greater than 50% respiratory variability, suggesting right atrial pressure of 3 mmHg. FINDINGS  Left Ventricle: Left ventricular ejection fraction, by estimation, is 60 to 65%. The left ventricle has normal function. The left ventricle has no regional wall motion abnormalities. The left ventricular internal cavity size was normal in size. There is  no left ventricular hypertrophy. Left ventricular diastolic parameters are consistent with Grade I diastolic dysfunction (impaired relaxation). Right Ventricle: The right ventricular size is normal. No increase in right ventricular wall thickness. Right ventricular systolic function is normal. There is normal pulmonary artery systolic pressure. The tricuspid regurgitant velocity is 2.10 m/s, and  with an assumed right atrial pressure of 3 mmHg, the estimated right ventricular systolic pressure is 20.6 mmHg. Left Atrium: Left atrial size was normal in size. Right  Atrium: Right atrial size was normal in size. Pericardium: There is no evidence of pericardial effusion. Mitral Valve: The mitral valve is normal in structure. Mild mitral annular calcification. Trivial mitral valve regurgitation. No evidence of mitral valve stenosis. Tricuspid Valve: The tricuspid valve is normal in structure. Tricuspid valve regurgitation is trivial. Aortic Valve: The aortic valve is tricuspid. There is moderate calcification of the aortic valve. Aortic valve regurgitation is trivial. Aortic regurgitation PHT measures 322 msec. Aortic valve sclerosis/calcification is present, without any evidence of aortic stenosis. Aortic valve peak gradient measures 5.3 mmHg. Pulmonic Valve: The pulmonic valve was normal in structure. Pulmonic valve regurgitation is trivial. Aorta: The aortic root is normal in size and structure. Venous: The inferior vena cava is normal in size with greater than 50% respiratory variability, suggesting right atrial pressure of 3 mmHg. IAS/Shunts: No atrial level shunt detected by color flow Doppler.  LEFT VENTRICLE PLAX 2D LVIDd:         3.20 cm     Diastology LVIDs:         2.10 cm     LV e' medial:    8.70 cm/s LV PW:         1.10 cm     LV E/e' medial:  6.6 LV IVS:        1.10 cm     LV e' lateral:   9.48 cm/s LVOT diam:     2.00 cm     LV E/e' lateral: 6.0 LV SV:         71 LV SV Index:   42 LVOT Area:     3.14 cm  LV Volumes (MOD) LV vol d, MOD A2C: 49.4 ml LV vol d, MOD A4C: 51.7 ml LV vol s, MOD A2C: 18.7 ml LV vol s, MOD A4C: 19.4 ml LV SV MOD A2C:     30.7 ml LV SV MOD A4C:     51.7 ml LV SV MOD BP:      32.3 ml RIGHT VENTRICLE RV Basal diam:  2.70 cm RV Mid diam:    2.80  cm RV S prime:     11.20 cm/s TAPSE (M-mode): 1.6 cm LEFT ATRIUM             Index        RIGHT ATRIUM           Index LA diam:        2.90 cm 1.70 cm/m   RA Area:     10.30 cm LA Vol (A2C):   20.2 ml 11.84 ml/m  RA Volume:   18.60 ml  10.90 ml/m LA Vol (A4C):   27.4 ml 16.06 ml/m LA Biplane Vol:  26.1 ml 15.30 ml/m  AORTIC VALVE AV Area (Vmax): 2.95 cm AV Vmax:        115.00 cm/s AV Peak Grad:   5.3 mmHg LVOT Vmax:      108.00 cm/s LVOT Vmean:     76.900 cm/s LVOT VTI:       0.227 m AI PHT:         322 msec  AORTA Ao Root diam: 3.00 cm Ao Asc diam:  3.00 cm MITRAL VALVE               TRICUSPID VALVE MV Area (PHT): 4.17 cm    TR Peak grad:   17.6 mmHg MV Decel Time: 182 msec    TR Vmax:        210.00 cm/s MV E velocity: 57.20 cm/s MV A velocity: 78.10 cm/s  SHUNTS MV E/A ratio:  0.73        Systemic VTI:  0.23 m                            Systemic Diam: 2.00 cm Dalton McleanMD Electronically signed by Wilfred Lacy Signature Date/Time: 11/27/2021/1:50:08 PM    Final    CT Angio Chest PE W and/or Wo Contrast  Result Date: 11/26/2021 CLINICAL DATA:  Pulmonary embolism (PE) suspected, high prob EXAM: CT ANGIOGRAPHY CHEST WITH CONTRAST TECHNIQUE: Multidetector CT imaging of the chest was performed using the standard protocol during bolus administration of intravenous contrast. Multiplanar CT image reconstructions and MIPs were obtained to evaluate the vascular anatomy. RADIATION DOSE REDUCTION: This exam was performed according to the departmental dose-optimization program which includes automated exposure control, adjustment of the mA and/or kV according to patient size and/or use of iterative reconstruction technique. CONTRAST:  61mL OMNIPAQUE IOHEXOL 350 MG/ML SOLN COMPARISON:  Chest CT 04/06/2005. CT abdomen 06/18/2016. Chest x-ray 11/26/2021 FINDINGS: Cardiovascular: Satisfactory opacification of the pulmonary arteries to the segmental level. No evidence of pulmonary embolism. Thoracic aorta is nonaneurysmal. Scattered atherosclerotic vascular calcifications of the aorta and coronary arteries. Normal heart size. No pericardial effusion. Mediastinum/Nodes: No enlarged mediastinal, hilar, or axillary lymph nodes. Thyroid gland, trachea, and esophagus demonstrate no significant findings. Small hiatal  hernia. Lungs/Pleura: Mild dependent subsegmental atelectasis. Lungs are otherwise clear. No pleural effusion or pneumothorax. Upper Abdomen: 2.0 cm left adrenal gland nodule with internal density of 41 HU, stable from 2018. No acute findings within the included upper abdomen. Musculoskeletal: No chest wall abnormality. No acute or significant osseous findings. Degenerative changes of both glenohumeral joints. Review of the MIP images confirms the above findings. IMPRESSION: 1. Negative for pulmonary embolism or other acute intrathoracic findings. 2. Small hiatal hernia. 3. Benign 2.0 cm left adrenal gland nodule, stable from 2018 and likely representing an adenoma. 4. Aortic atherosclerosis (ICD10-I70.0). Electronically Signed   By: Duanne Guess D.O.   On: 11/26/2021  10:46   DG Chest Port 1 View  Result Date: 11/26/2021 CLINICAL DATA:  88 year old female with chest pain and shortness of breath. EXAM: PORTABLE CHEST 1 VIEW COMPARISON:  Chest radiographs 09/11/2017 and earlier. FINDINGS: Portable AP upright view at 0841 hours. Borderline to mildly hyperinflated lung volumes. Normal cardiac size and mediastinal contours. Visualized tracheal air column is within normal limits. Allowing for portable technique the lungs are clear. No pneumothorax or pleural effusion. No acute osseous abnormality identified. IMPRESSION: No acute cardiopulmonary abnormality. Electronically Signed   By: Odessa FlemingH  Hall M.D.   On: 11/26/2021 08:46    Microbiology: Results for orders placed or performed during the hospital encounter of 11/26/21  Resp Panel by RT-PCR (Flu A&B, Covid) Anterior Nasal Swab     Status: None   Collection Time: 11/26/21  9:27 AM   Specimen: Anterior Nasal Swab  Result Value Ref Range Status   SARS Coronavirus 2 by RT PCR NEGATIVE NEGATIVE Final    Comment: (NOTE) SARS-CoV-2 target nucleic acids are NOT DETECTED.  The SARS-CoV-2 RNA is generally detectable in upper respiratory specimens during the acute  phase of infection. The lowest concentration of SARS-CoV-2 viral copies this assay can detect is 138 copies/mL. A negative result does not preclude SARS-Cov-2 infection and should not be used as the sole basis for treatment or other patient management decisions. A negative result may occur with  improper specimen collection/handling, submission of specimen other than nasopharyngeal swab, presence of viral mutation(s) within the areas targeted by this assay, and inadequate number of viral copies(<138 copies/mL). A negative result must be combined with clinical observations, patient history, and epidemiological information. The expected result is Negative.  Fact Sheet for Patients:  BloggerCourse.comhttps://www.fda.gov/media/152166/download  Fact Sheet for Healthcare Providers:  SeriousBroker.ithttps://www.fda.gov/media/152162/download  This test is no t yet approved or cleared by the [place] FDA and  has been authorized for detection and/or diagnosis of SARS-CoV-2 by FDA under an Emergency Use Authorization (EUA). This EUA will remain  in effect (meaning this test can be used) for the duration of the COVID-19 declaration under Section 564(b)(1) of the Act, 21 U.S.C.section 360bbb-3(b)(1), unless the authorization is terminated  or revoked sooner.       Influenza A by PCR NEGATIVE NEGATIVE Final   Influenza B by PCR NEGATIVE NEGATIVE Final    Comment: (NOTE) The Xpert Xpress SARS-CoV-2/FLU/RSV plus assay is intended as an aid in the diagnosis of influenza from Nasopharyngeal swab specimens and should not be used as a sole basis for treatment. Nasal washings and aspirates are unacceptable for Xpert Xpress SARS-CoV-2/FLU/RSV testing.  Fact Sheet for Patients: BloggerCourse.comhttps://www.fda.gov/media/152166/download  Fact Sheet for Healthcare Providers: SeriousBroker.ithttps://www.fda.gov/media/152162/download  This test is not yet approved or cleared by the [place] FDA and has been authorized for detection and/or diagnosis of  SARS-CoV-2 by FDA under an Emergency Use Authorization (EUA). This EUA will remain in effect (meaning this test can be used) for the duration of the COVID-19 declaration under Section 564(b)(1) of the Act, 21 U.S.C. section 360bbb-3(b)(1), unless the authorization is terminated or revoked.  Performed at [facility], [address] [place], [place] [address]     Labs: CBC: Recent Labs  Lab 11/26/21 0828 11/27/21 0209  WBC 6.8  --   HGB 15.6* 13.8  HCT 46.4* 39.9  MCV 86.6  --   PLT 244  --    Basic Metabolic Panel: Recent Labs  Lab 11/26/21 1050  NA 138  K 3.8  CL 108  CO2 24  GLUCOSE 104*  BUN 17  CREATININE 0.89  CALCIUM 8.9   Liver Function Tests: Recent Labs  Lab 11/26/21 1050  AST 18  ALT 14  ALKPHOS 56  BILITOT 0.5  PROT 5.6*  ALBUMIN 3.4*   CBG: No results for input(s): "GLUCAP" in the last 168 hours.  Discharge time spent: greater than 30 minutes.  Signed: Joycelyn Das, MD [facility] 11/27/2021

## 2021-12-01 ENCOUNTER — Other Ambulatory Visit: Payer: Self-pay | Admitting: Orthopedic Surgery

## 2021-12-01 ENCOUNTER — Telehealth: Payer: Self-pay | Admitting: *Deleted

## 2021-12-01 DIAGNOSIS — R0789 Other chest pain: Secondary | ICD-10-CM

## 2021-12-01 NOTE — Telephone Encounter (Signed)
Transition Care Management Follow-up Telephone Call Date of discharge and from where: 11/27/2021 [facility] How have you been since you were released from the hospital? [facility], requesting Cardiology referral.  Any questions or concerns? No  Items Reviewed: Did the pt receive and understand the discharge instructions provided? Yes  Medications obtained and verified? Yes  Other? No  Any new allergies since your discharge? No  Dietary orders reviewed? Yes Do you have support at home? Yes   Home Care and Equipment/Supplies: Were home health services ordered? no If so, what is the name of the agency? na  Has the agency set up a time to come to the patient's home? not applicable Were any new equipment or medical supplies ordered?  No What is the name of the medical supply agency? na Were you able to get the supplies/equipment? no Do you have any questions related to the use of the equipment or supplies? No  Functional Questionnaire: (I = Independent and D = Dependent) ADLs: I  Bathing/Dressing- I  Meal Prep- I  Eating- I  Maintaining continence- I  Transferring/Ambulation- I  Managing Meds- I  Follow up appointments reviewed:  PCP Hospital f/u appt confirmed? Yes  Scheduled to see Amy August 10,2023 [facility] f/u appt confirmed? No   Are transportation arrangements needed? No  If their condition worsens, is the pt aware to call PCP or go to the Emergency Dept.? Yes Was the patient provided with contact information for the PCP's office or ED? Yes Was to pt encouraged to call back with questions or concerns? No

## 2021-12-01 NOTE — Telephone Encounter (Signed)
Referral to cardiology made per patient request.

## 2021-12-11 ENCOUNTER — Ambulatory Visit: Payer: Medicare PPO | Admitting: Orthopedic Surgery

## 2021-12-11 ENCOUNTER — Encounter: Payer: Self-pay | Admitting: Orthopedic Surgery

## 2021-12-11 VITALS — BP 148/88 | HR 82 | Temp 96.8°F | Ht 62.0 in | Wt 156.0 lb

## 2021-12-11 DIAGNOSIS — R0789 Other chest pain: Secondary | ICD-10-CM

## 2021-12-11 DIAGNOSIS — E1140 Type 2 diabetes mellitus with diabetic neuropathy, unspecified: Secondary | ICD-10-CM

## 2021-12-11 DIAGNOSIS — R030 Elevated blood-pressure reading, without diagnosis of hypertension: Secondary | ICD-10-CM

## 2021-12-11 DIAGNOSIS — E782 Mixed hyperlipidemia: Secondary | ICD-10-CM | POA: Diagnosis not present

## 2021-12-11 DIAGNOSIS — I10 Essential (primary) hypertension: Secondary | ICD-10-CM

## 2021-12-11 DIAGNOSIS — I700 Atherosclerosis of aorta: Secondary | ICD-10-CM | POA: Diagnosis not present

## 2021-12-11 DIAGNOSIS — K219 Gastro-esophageal reflux disease without esophagitis: Secondary | ICD-10-CM | POA: Diagnosis not present

## 2021-12-11 MED ORDER — AMLODIPINE BESYLATE 2.5 MG PO TABS: 2.5000 mg | ORAL_TABLET | Freq: Every day | ORAL | 1 refills | Status: DC

## 2021-12-11 NOTE — Progress Notes (Signed)
Careteam: Patient Care Team: Octavia Heir, NP as PCP - General (Adult Health Nurse Practitioner) [name], MD as Consulting Physician (Ophthalmology)  Seen by: [name], AGNP-C  PLACE OF SERVICE:  [facility]  Advanced Directive information    Allergies  Allergen Reactions   Statins Other (See Comments)    Myalgias with crestor even when taken only a few times per week   Dairycare [Lactase-Lactobacillus]     Stomach upset    Erythromycin Itching    SEVERE ITCHING   Other Other (See Comments)    NO "ACIDIC" FOODS!! They cause GI upset   Pollen Extract-Tree Extract [Pollen Extract]    Tomato Other (See Comments)    Causes GI upset   Zyrtec [Cetirizine Hcl]     Increased sleepiness    Chief Complaint  Patient presents with   Hospitalization Follow-up    Patient presents today for a hospital follow-up. She was admitted into [facility] on 11/26/21-11/27/21 for atypical chest pain.     HPI: Patient is a 88 y.o. female seen today for f/u s/p hospitalization [facility] 07/26-07/27.   07/26 she presented to the ED due to unresolved chest pain and left shoulder pain. EMS reported SBP > 200, O2 sats 87%. She was given nitroglycerin once, and chest pain improved. EKG NSR. CT chest negative for PE or acute intrathoracic abnormality. 2D echo LVEF 60-65%, no focal wall abnormality. Troponins negative. BNP 7. She was noted to have elevated blood pressures by PCP, normalized after recheck. She has been checking her pressures at home, average SBP 120-140's. She was discharged home and advised to f/u wit PCP. Prior to appointment she requested cardiology referral due to parents h/o cardiovascular disease.  Today, she denies any new episodes of chest pain. Denies sob, headaches and blurred vision. Blood pressure elevated todays visit. She is open to starting medication. She has a wrist cuff at home. Discussed purchasing arm blood pressure cuff and checking pressures twice daily. Low sodium diet  also discussed.   Admits to intermittent GERD. Not on medication.   T2DM controlled without medication.   LDL 124 06/30/2021, remains on Crestor.   Review of Systems:  Review of Systems  Constitutional:  Negative for chills, fever, malaise/fatigue and weight loss.  HENT:  Negative for sore throat.   Eyes:  Negative for blurred vision and double vision.  Respiratory:  Negative for cough, shortness of breath and wheezing.   Cardiovascular:  Positive for chest pain and leg swelling. Negative for palpitations and orthopnea.  Gastrointestinal:  Positive for heartburn.  Genitourinary:  Negative for dysuria and frequency.  Musculoskeletal:  Negative for falls and joint pain.  Skin:  Negative for rash.  Neurological:  Negative for dizziness, weakness and headaches.  Psychiatric/Behavioral:  Negative for depression. The patient is not nervous/anxious and does not have insomnia.     Past Medical History:  Diagnosis Date   Allergic rhinitis    Atypical chest pain 09/10/2015   Bronchitis, acute    Dyspnea on exertion    Exudative pleural effusion 5/09    sterile, benign, inflammatory   GERD (gastroesophageal reflux disease) 09/10/2015   Systolic murmur 09/10/2015   Past Surgical History:  Procedure Laterality Date   APPENDECTOMY     CATARACT EXTRACTION W/ INTRAOCULAR LENS IMPLANT Bilateral 12/2018   OOPHORECTOMY Right    thoracentesis left  5/09   Social History:   reports that she has never smoked. She has never used smokeless tobacco. She reports that  she does not drink alcohol and does not use drugs.  Family History  Problem Relation Age of Onset   Allergies Mother    Heart disease Mother 53   Cancer Mother    Dementia Mother    Heart disease Father 85   Thyroid cancer Sister    Dementia Sister 15    Medications: Patient's Medications  New Prescriptions   No medications on file  Previous Medications   CALCIUM CARB-CHOLECALCIFEROL (CALCIUM + D3 PO)    Take 1 tablet by  mouth daily.   EZETIMIBE (ZETIA) 10 MG TABLET    Take 1 tablet (10 mg total) by mouth every other day.   FLUTICASONE (FLONASE) 50 MCG/ACT NASAL SPRAY    Place 1 spray into both nostrils daily.   MULTIPLE VITAMINS-MINERALS (MULTIVITAMIN WOMEN 50+) TABS    Take 1 tablet by mouth daily.   PROBIOTIC PRODUCT (UP4 PROBIOTICS PO)    Take 1 tablet by mouth daily.   PROPYLENE GLYCOL (SYSTANE BALANCE OP)    Apply 1 drop to eye daily as needed (dry eyes).   ROSUVASTATIN (CRESTOR) 5 MG TABLET    Take 2 tablets (10 mg total) by mouth every other day.  Modified Medications   No medications on file  Discontinued Medications   No medications on file    Physical Exam:  Vitals:   12/11/21 1435  BP: (!) 140/90  Pulse: 82  Temp: (!) 96.8 F (36 C)  SpO2: 93%  Weight: 156 lb (70.8 kg)  Height: 5\' 2"  (1.575 m)   Body mass index is 28.53 kg/m. Wt Readings from Last 3 Encounters:  12/11/21 156 lb (70.8 kg)  11/26/21 153 lb (69.4 kg)  08/19/21 153 lb 9.6 oz (69.7 kg)    Physical Exam Vitals reviewed.  Constitutional:      General: She is not in acute distress. HENT:     Head: Normocephalic.  Eyes:     General:        Right eye: No discharge.        Left eye: No discharge.  Neck:     Vascular: No carotid bruit.  Cardiovascular:     Rate and Rhythm: Normal rate and regular rhythm.     Pulses: Normal pulses.     Heart sounds: Normal heart sounds.  Pulmonary:     Effort: Pulmonary effort is normal. No respiratory distress.     Breath sounds: Normal breath sounds. No wheezing.  Abdominal:     General: Bowel sounds are normal. There is no distension.     Palpations: Abdomen is soft.     Tenderness: There is no abdominal tenderness.  Musculoskeletal:     Cervical back: Neck supple.     Right lower leg: Edema present.     Left lower leg: Edema present.     Comments: Non pitting  Lymphadenopathy:     Cervical: No cervical adenopathy.  Skin:    General: Skin is warm and dry.      Capillary Refill: Capillary refill takes less than 2 seconds.  Neurological:     General: No focal deficit present.     Mental Status: She is alert and oriented to person, place, and time.  Psychiatric:        Mood and Affect: Mood normal.        Behavior: Behavior normal.     Labs reviewed: Basic Metabolic Panel: Recent Labs    12/31/20 1030 06/30/21 1015 11/26/21 1050  NA 140 142 138  K 4.0 4.2 3.8  CL 105 106 108  CO2 24 25 24   GLUCOSE 120* 113* 104*  BUN 19 17 17   CREATININE 0.81 0.84 0.89  CALCIUM 9.9 9.7 8.9   Liver Function Tests: Recent Labs    12/31/20 1030 06/30/21 1015 11/26/21 1050  AST 17 18 18   ALT 18 19 14   ALKPHOS  --   --  56  BILITOT 0.6 0.5 0.5  PROT 6.7 6.6 5.6*  ALBUMIN  --   --  3.4*   No results for input(s): "LIPASE", "AMYLASE" in the last 8760 hours. No results for input(s): "AMMONIA" in the last 8760 hours. CBC: Recent Labs    12/31/20 1030 06/30/21 1015 11/26/21 0828 11/27/21 0209  WBC 6.6 6.2 6.8  --   NEUTROABS 3,927 3,243  --   --   HGB 15.7* 15.4 15.6* 13.8  HCT 46.1* 46.3* 46.4* 39.9  MCV 87.6 88.7 86.6  --   PLT 280 302 244  --    Lipid Panel: Recent Labs    12/31/20 1030 06/30/21 1015  CHOL 177 201*  HDL 53 51  LDLCALC 104* 124*  TRIG 105 143  CHOLHDL 3.3 3.9   TSH: No results for input(s): "TSH" in the last 8760 hours. A1C: Lab Results  Component Value Date   HGBA1C 6.5 (H) 06/30/2021     Assessment/Plan 1. Essential hypertension - uncontrolled  - asymptomatic - BUN/creat 17/0.89 11/26/2021 - start amlodipine - purchase arm blood pressure cuff- check pressures twice daily and record - bring bp record to cardiology appointment - amLODipine (NORVASC) 2.5 MG tablet; Take 1 tablet (2.5 mg total) by mouth daily.  Dispense: 30 tablet; Refill: 1  2. Atypical chest pain - hospitalized 07/26-07/27 - CT chest negative PE, troponin unremarkable, BNP 7 - EKG NSR, no STEMI - cardiology referral   3.  Controlled type 2 diabetes mellitus with diabetic neuropathy, without long-term current use of insulin (HCC) - A1c 6.5 06/30/2021 - diet controlled  4. Mixed hyperlipidemia - LDL 124 06/30/2021 - cont Crestor  5. Aortic atherosclerosis (HCC) - noted on CT chest 07/26 - cont statin  6. GERD - reports intermittent reflux - advised to trial TUMS prn or Nexium daily   Labs/tests: fasting labs scheduled 01/02/2022  Total time: 34 minutes. Greater than 50% of total time spent doing patient education regarding elevated blood pressure, medication education, symptom management.     Next appt: 01/08/2022  07/02/2021, 8/26  [facility] [phone]

## 2021-12-11 NOTE — Patient Instructions (Addendum)
Please check blood pressures twice daily/ take after medication- bring readings to cardiology  Please purchase automated arm cuff blood pressure machine  Avoid food triggers, try TUMS if pain reoccurs- if it helps- you can consider taking Nexium once daily

## 2021-12-22 ENCOUNTER — Encounter (HOSPITAL_BASED_OUTPATIENT_CLINIC_OR_DEPARTMENT_OTHER): Payer: Self-pay | Admitting: Cardiology

## 2021-12-22 ENCOUNTER — Ambulatory Visit (HOSPITAL_BASED_OUTPATIENT_CLINIC_OR_DEPARTMENT_OTHER): Payer: Medicare PPO | Admitting: Cardiology

## 2021-12-22 VITALS — BP 116/68 | HR 78 | Ht 62.0 in | Wt 156.0 lb

## 2021-12-22 DIAGNOSIS — Z8249 Family history of ischemic heart disease and other diseases of the circulatory system: Secondary | ICD-10-CM

## 2021-12-22 DIAGNOSIS — E782 Mixed hyperlipidemia: Secondary | ICD-10-CM | POA: Diagnosis not present

## 2021-12-22 DIAGNOSIS — Z712 Person consulting for explanation of examination or test findings: Secondary | ICD-10-CM

## 2021-12-22 DIAGNOSIS — R0789 Other chest pain: Secondary | ICD-10-CM | POA: Diagnosis not present

## 2021-12-22 DIAGNOSIS — I2510 Atherosclerotic heart disease of native coronary artery without angina pectoris: Secondary | ICD-10-CM | POA: Diagnosis not present

## 2021-12-22 NOTE — Patient Instructions (Addendum)
Medication Instructions:  Your Physician recommend you continue on your current medication as directed.     *If you need a refill on your cardiac medications before your next appointment, please call your pharmacy*   Lab Work: None ordered today   Testing/Procedures: None ordered today   Follow-Up: At [facility], you and your health needs are our priority.  As part of our continuing mission to provide you with exceptional heart care, we have created designated Provider Care Teams.  These Care Teams include your primary Cardiologist (physician) and Advanced Practice Providers (APPs -  Physician Assistants and Nurse Practitioners) who all work together to provide you with the care you need, when you need it.  We recommend signing up for the patient portal called "MyChart".  Sign up information is provided on this After Visit Summary.  MyChart is used to connect with patients for Virtual Visits (Telemedicine).  Patients are able to view lab/test results, encounter notes, upcoming appointments, etc.  Non-urgent messages can be sent to your provider as well.   To learn more about what you can do with MyChart, go to [email or web address].    Your next appointment:   3 month(s)  The format for your next appointment:   In Person  Provider:   Jodelle Red, MD{  If you have chest discomfort that is severe and sustained, come to ER. Otherwise if it is quick, mild, tender to touch, etc we will follow up in 3 mos. If it is mild but becomes more frequent, let me know and we can order the nuclear stress test we discussed.

## 2021-12-22 NOTE — Progress Notes (Signed)
Cardiology Office Note:    Date:  12/23/2021   ID:  Sara Charles, DOB December 28, 1933, MRN [id number]  PCP:  Octavia Heir, NP  Cardiologist:  Jodelle Red, MD  Referring MD: Octavia Heir, NP   CC: new patient evaluation for atypical chest pain and family history  History of Present Illness:    Sara Charles is a 88 y.o. female with a hx of systolic murmur, hyperlipidemia, diabetes, who is seen as a new consult at the request of [place], Amy E, NP for the evaluation and management of atypical chest pain.  She was admitted to the hospital 11/26/2021 with left chest pain worse with movement. She was found to have transient hypoxia and elevated blood pressure in the ED. CT angiogram was done which was negative for pulmonary embolism or acute pulmonary issue. Hemoglobin was 15.6. Troponins were negative. EKG was unremarkable. It was felt she would benefit from outpatient monitoring and initiation of antihypertensive as outpatient. Following her discharge she requested a referral to cardiology.  Previously followed by Dr. Daleen Squibb in cardiology. Last seen by Dr. Duke Salvia 09/10/2015 where she was noted to have a mild systolic murmur. An echo (09/2015) was ordered and showed that her heart squeezes well but does not relax completely. There was trivial leaking of the mitral, tricuspid, and aortic valves.  Chest pain: -Initial onset: Initially had left chest pains, went to the ER 11/26/21 due to concern of her family history of heart disease. -Aggravating/alleviating factors:  While in the hospital she had a meal with tilapia, possibly with garlic in the crust. She states that she then developed chest pain similar to that which prompted her ED visit. She wonders if her pain is gastrointestinal, but it still manifests in her left chest. -Comorbidities: Hypertension - she has been prescribed amlodipine; she wanted to discuss this at her visit today. At home her blood pressures have averaged 130/68.  She has been using an Omron cuff at home, while sitting at the table. -Cardiac ROS: no shortness of breath, no PND, no orthopnea, no LE edema, no syncope -Family history: Her father died at 26 yo of a heart attack. Her mother had CABG x5 and 5 stent placements.   At this time she notes "feeling funny" in her lower back. She would not necessarily describe this as an aching pain.  She confirms having some LE edema.  She denies any palpitations, shortness of breath. No lightheadedness, headaches, syncope, orthopnea, or PND.  Past Medical History:  Diagnosis Date   Allergic rhinitis    Atypical chest pain 09/10/2015   Bronchitis, acute    Dyspnea on exertion    Exudative pleural effusion 5/09    sterile, benign, inflammatory   GERD (gastroesophageal reflux disease) 09/10/2015   Systolic murmur 09/10/2015    Past Surgical History:  Procedure Laterality Date   APPENDECTOMY     CATARACT EXTRACTION W/ INTRAOCULAR LENS IMPLANT Bilateral 12/2018   OOPHORECTOMY Right    thoracentesis left  5/09    Current Medications: Current Outpatient Medications on File Prior to Visit  Medication Sig   amLODipine (NORVASC) 2.5 MG tablet Take 1 tablet (2.5 mg total) by mouth daily.   Calcium Carb-Cholecalciferol (CALCIUM + D3 PO) Take 1 tablet by mouth daily.   ezetimibe (ZETIA) 10 MG tablet Take 1 tablet (10 mg total) by mouth every other day.   fluticasone (FLONASE) 50 MCG/ACT nasal spray Place 1 spray into both nostrils daily. (Patient taking differently: Place 1 spray into  both nostrils daily as needed for allergies.)   Multiple Vitamins-Minerals (MULTIVITAMIN WOMEN 50+) TABS Take 1 tablet by mouth daily.   Probiotic Product (UP4 PROBIOTICS PO) Take 1 tablet by mouth daily.   Propylene Glycol (SYSTANE BALANCE OP) Apply 1 drop to eye daily as needed (dry eyes).   rosuvastatin (CRESTOR) 5 MG tablet Take 2 tablets (10 mg total) by mouth every other day.   Coenzyme Q10 100 MG TABS Take by mouth.   No  current facility-administered medications on file prior to visit.     Allergies:   Statins, Dairycare [lactase-lactobacillus], Erythromycin, Other, Pollen extract-tree extract [pollen extract], Tomato, and Zyrtec [cetirizine hcl]   Social History   Tobacco Use   Smoking status: Never   Smokeless tobacco: Never  Vaping Use   Vaping Use: Never used  Substance Use Topics   Alcohol use: No   Drug use: No    Family History: family history includes Allergies in her mother; Cancer in her mother; Dementia in her mother; Dementia (age of onset: 72) in her sister; Heart disease (age of onset: 5) in her father; Heart disease (age of onset: 72) in her mother; Thyroid cancer in her sister.  ROS:   Please see the history of present illness.  Additional pertinent ROS: Constitutional: Negative for chills, fever, night sweats, unintentional weight loss  HENT: Negative for ear pain and hearing loss.   Eyes: Negative for loss of vision and eye pain.  Respiratory: Negative for cough, sputum, wheezing.   Cardiovascular: See HPI. Gastrointestinal: Negative for abdominal pain, melena, and hematochezia.  Genitourinary: Negative for dysuria and hematuria.  Musculoskeletal: Negative for falls and myalgias. Positive for lower back discomfort. Skin: Negative for itching and rash.  Neurological: Negative for focal weakness, focal sensory changes and loss of consciousness.  Endo/Heme/Allergies: Does not bruise/bleed easily.     EKGs/Labs/Other Studies Reviewed:    The following studies were reviewed today:  Echocardiogram  11/27/2021: Sonographer Comments: Image acquisition challenging due to respiratory  motion.  IMPRESSIONS    1. Left ventricular ejection fraction, by estimation, is 60 to 65%. The  left ventricle has normal function. The left ventricle has no regional  wall motion abnormalities. Left ventricular diastolic parameters are  consistent with Grade I diastolic  dysfunction (impaired  relaxation).   2. Right ventricular systolic function is normal. The right ventricular  size is normal. There is normal pulmonary artery systolic pressure. The  estimated right ventricular systolic pressure is 20.6 mmHg.   3. The mitral valve is normal in structure. Trivial mitral valve  regurgitation. No evidence of mitral stenosis.   4. The aortic valve is tricuspid. There is moderate calcification of the  aortic valve. Aortic valve regurgitation is trivial. Aortic valve  sclerosis/calcification is present, without any evidence of aortic  stenosis.   5. The inferior vena cava is normal in size with greater than 50%  respiratory variability, suggesting right atrial pressure of 3 mmHg.   CTA Chest  11/26/2021: IMPRESSION: 1. Negative for pulmonary embolism or other acute intrathoracic findings. 2. Small hiatal hernia. 3. Benign 2.0 cm left adrenal gland nodule, stable from 2018 and likely representing an adenoma. 4. Aortic atherosclerosis (ICD10-I70.0).   EKG:  EKG is personally reviewed.   12/22/2021:  SR with 1st degree AV block at 78 bpm  Recent Labs: 11/26/2021: ALT 14; B Natriuretic Peptide 7.3; BUN 17; Creatinine, Ser 0.89; Platelets 244; Potassium 3.8; Sodium 138 11/27/2021: Hemoglobin 13.8   Recent Lipid Panel  Component Value Date/Time   CHOL 201 (H) 06/30/2021 1015   TRIG 143 06/30/2021 1015   HDL 51 06/30/2021 1015   CHOLHDL 3.9 06/30/2021 1015   VLDL 29 09/12/2017 0026   LDLCALC 124 (H) 06/30/2021 1015    Physical Exam:    VS:  BP 116/68   Pulse 78   Ht 5\' 2"  (1.575 m)   Wt 156 lb (70.8 kg)   BMI 28.53 kg/m     Wt Readings from Last 3 Encounters:  12/22/21 156 lb (70.8 kg)  12/11/21 156 lb (70.8 kg)  11/26/21 153 lb (69.4 kg)    GEN: Well nourished, well developed in no acute distress HEENT: Normal, moist mucous membranes NECK: No JVD CARDIAC: regular rhythm, normal S1 and S2, no rubs or gallops. No murmur. VASCULAR: Radial and DP pulses 2+  bilaterally. No carotid bruits RESPIRATORY:  Clear to auscultation without rales, wheezing or rhonchi  ABDOMEN: Soft, non-tender, non-distended MUSCULOSKELETAL:  Ambulates independently SKIN: Warm and dry, trace nonpitting LE edema NEUROLOGIC:  Alert and oriented x 3. No focal neuro deficits noted. PSYCHIATRIC:  Normal affect    ASSESSMENT:    1. Atypical chest pain   2. Mixed hyperlipidemia   3. Coronary artery calcification seen on CT scan   4. Encounter to discuss test results   5. Family history of heart disease    PLAN:    Atypical chest pain -We spent significant time today reviewing different parts of the cardiovascular system (electrical, vascular, functional, and valvular). We discussed how each of these systems can present with different symptoms.  -her symptoms are largely atypical, and we discussed options for further evaluation. After shared decision making, we will monitor and reassess in 3 mos -we reviewed red flag warning signs that require immediate medical attention  Mixed hyperlipidemia Coronary calcification on CT Family history of heart disease -continue rosuvastatin -above age recommendations for aspirin  Cardiac risk counseling and prevention recommendations: -recommend heart healthy/Mediterranean diet, with whole grains, fruits, vegetable, fish, lean meats, nuts, and olive oil. Limit salt. -recommend moderate walking, 3-5 times/week for 30-50 minutes each session. Aim for at least 150 minutes.week. Goal should be pace of 3 miles/hours, or walking 1.5 miles in 30 minutes -recommend avoidance of tobacco products. Avoid excess alcohol. -ASCVD risk score: The ASCVD Risk score (Arnett DK, et al., 2019) failed to calculate for the following reasons:   The 2019 ASCVD risk score is only valid for ages 9 to 89    Plan for follow up: 3 months or sooner as needed.  78, MD, PhD, [facility]   [facility]    Medication Adjustments/Labs  and Tests Ordered: Current medicines are reviewed at length with the patient today.  Concerns regarding medicines are outlined above.   Orders Placed This Encounter  Procedures   EKG 12-Lead   No orders of the defined types were placed in this encounter.  Patient Instructions  Medication Instructions:  Your Physician recommend you continue on your current medication as directed.     *If you need a refill on your cardiac medications before your next appointment, please call your pharmacy*   Lab Work: None ordered today   Testing/Procedures: None ordered today   Follow-Up: At [facility], you and your health needs are our priority.  As part of our continuing mission to provide you with exceptional heart care, we have created designated Provider Care Teams.  These Care Teams include your primary Cardiologist (physician) and Advanced Practice Providers (APPs -  Physician Assistants and Nurse Practitioners) who all work together to provide you with the care you need, when you need it.  We recommend signing up for the patient portal called "MyChart".  Sign up information is provided on this After Visit Summary.  MyChart is used to connect with patients for Virtual Visits (Telemedicine).  Patients are able to view lab/test results, encounter notes, upcoming appointments, etc.  Non-urgent messages can be sent to your provider as well.   To learn more about what you can do with MyChart, go to [email or web address].    Your next appointment:   3 month(s)  The format for your next appointment:   In Person  Provider:   Jodelle Red, MD{  If you have chest discomfort that is severe and sustained, come to ER. Otherwise if it is quick, mild, tender to touch, etc we will follow up in 3 mos. If it is mild but becomes more frequent, let me know and we can order the nuclear stress test we discussed.   I,Mathew Stumpf,acting as a Neurosurgeon for [employer], MD.,have  documented all relevant documentation on the behalf of Jodelle Red, MD,as directed by  Jodelle Red, MD while in the presence of Jodelle Red, MD.  I, Jodelle Red, MD, have reviewed all documentation for this visit. The documentation on 12/23/21 for the exam, diagnosis, procedures, and orders are all accurate and complete.   Signed, Jodelle Red, MD PhD 12/23/2021 5:24 PM    [facility]

## 2021-12-27 ENCOUNTER — Other Ambulatory Visit: Payer: Self-pay | Admitting: Nurse Practitioner

## 2021-12-27 DIAGNOSIS — E782 Mixed hyperlipidemia: Secondary | ICD-10-CM

## 2021-12-29 NOTE — Telephone Encounter (Signed)
Pharmacy requested refill.  Pended Rx and sent to Amy for approval due to HIGH ALERT Warning.  

## 2022-01-01 ENCOUNTER — Other Ambulatory Visit: Payer: Self-pay

## 2022-01-01 DIAGNOSIS — K219 Gastro-esophageal reflux disease without esophagitis: Secondary | ICD-10-CM

## 2022-01-01 DIAGNOSIS — E1140 Type 2 diabetes mellitus with diabetic neuropathy, unspecified: Secondary | ICD-10-CM

## 2022-01-01 DIAGNOSIS — R030 Elevated blood-pressure reading, without diagnosis of hypertension: Secondary | ICD-10-CM

## 2022-01-01 DIAGNOSIS — E782 Mixed hyperlipidemia: Secondary | ICD-10-CM

## 2022-01-02 ENCOUNTER — Other Ambulatory Visit: Payer: Medicare PPO

## 2022-01-02 DIAGNOSIS — R030 Elevated blood-pressure reading, without diagnosis of hypertension: Secondary | ICD-10-CM | POA: Diagnosis not present

## 2022-01-02 DIAGNOSIS — E782 Mixed hyperlipidemia: Secondary | ICD-10-CM | POA: Diagnosis not present

## 2022-01-02 DIAGNOSIS — K219 Gastro-esophageal reflux disease without esophagitis: Secondary | ICD-10-CM | POA: Diagnosis not present

## 2022-01-02 DIAGNOSIS — E1140 Type 2 diabetes mellitus with diabetic neuropathy, unspecified: Secondary | ICD-10-CM | POA: Diagnosis not present

## 2022-01-03 LAB — CBC WITH DIFFERENTIAL/PLATELET
Absolute Monocytes: 574 cells/uL (ref 200–950)
Basophils Absolute: 33 cells/uL (ref 0–200)
Basophils Relative: 0.5 %
Eosinophils Absolute: 251 cells/uL (ref 15–500)
Eosinophils Relative: 3.8 %
HCT: 45.4 % — ABNORMAL HIGH (ref 35.0–45.0)
Hemoglobin: 15.4 g/dL (ref 11.7–15.5)
Lymphs Abs: 1934 cells/uL (ref 850–3900)
MCH: 29.7 pg (ref 27.0–33.0)
MCHC: 33.9 g/dL (ref 32.0–36.0)
MCV: 87.5 fL (ref 80.0–100.0)
MPV: 10.3 fL (ref 7.5–12.5)
Monocytes Relative: 8.7 %
Neutro Abs: 3808 cells/uL (ref 1500–7800)
Neutrophils Relative %: 57.7 %
Platelets: 258 10*3/uL (ref 140–400)
RBC: 5.19 10*6/uL — ABNORMAL HIGH (ref 3.80–5.10)
RDW: 12.7 % (ref 11.0–15.0)
Total Lymphocyte: 29.3 %
WBC: 6.6 10*3/uL (ref 3.8–10.8)

## 2022-01-03 LAB — MICROALBUMIN / CREATININE URINE RATIO
Creatinine, Urine: 264 mg/dL (ref 20–275)
Microalb Creat Ratio: 9 mcg/mg creat (ref <30)
Microalb, Ur: 2.5 mg/dL

## 2022-01-03 LAB — COMPLETE METABOLIC PANEL WITH GFR
AG Ratio: 2.2 (calc) (ref 1.0–2.5)
ALT: 14 U/L (ref 6–29)
AST: 16 U/L (ref 10–35)
Albumin: 4.3 g/dL (ref 3.6–5.1)
Alkaline phosphatase (APISO): 73 U/L (ref 37–153)
BUN: 19 mg/dL (ref 7–25)
CO2: 24 mmol/L (ref 20–32)
Calcium: 9.3 mg/dL (ref 8.6–10.4)
Chloride: 106 mmol/L (ref 98–110)
Creat: 0.79 mg/dL (ref 0.60–0.95)
Globulin: 2 g/dL (calc) (ref 1.9–3.7)
Glucose, Bld: 100 mg/dL (ref 65–139)
Potassium: 4.2 mmol/L (ref 3.5–5.3)
Sodium: 140 mmol/L (ref 135–146)
Total Bilirubin: 0.5 mg/dL (ref 0.2–1.2)
Total Protein: 6.3 g/dL (ref 6.1–8.1)
eGFR: 73 mL/min/{1.73_m2} (ref >=60)

## 2022-01-03 LAB — LIPID PANEL
Cholesterol: 150 mg/dL (ref <200)
HDL: 53 mg/dL (ref >=50)
LDL Cholesterol (Calc): 77 mg/dL (calc)
Non-HDL Cholesterol (Calc): 97 mg/dL (calc) (ref <130)
Total CHOL/HDL Ratio: 2.8 (calc) (ref <5.0)
Triglycerides: 110 mg/dL (ref <150)

## 2022-01-03 LAB — HEMOGLOBIN A1C
Hgb A1c MFr Bld: 6.9 % of total Hgb — ABNORMAL HIGH (ref <5.7)
Mean Plasma Glucose: 151 mg/dL
eAG (mmol/L): 8.4 mmol/L

## 2022-01-08 ENCOUNTER — Encounter: Payer: Self-pay | Admitting: Orthopedic Surgery

## 2022-01-08 ENCOUNTER — Ambulatory Visit: Payer: Medicare PPO | Admitting: Orthopedic Surgery

## 2022-01-08 VITALS — BP 114/68 | HR 81 | Temp 98.4°F | Ht 62.0 in | Wt 157.8 lb

## 2022-01-08 DIAGNOSIS — E1140 Type 2 diabetes mellitus with diabetic neuropathy, unspecified: Secondary | ICD-10-CM | POA: Diagnosis not present

## 2022-01-08 DIAGNOSIS — R0789 Other chest pain: Secondary | ICD-10-CM | POA: Diagnosis not present

## 2022-01-08 DIAGNOSIS — I10 Essential (primary) hypertension: Secondary | ICD-10-CM

## 2022-01-08 DIAGNOSIS — E782 Mixed hyperlipidemia: Secondary | ICD-10-CM | POA: Diagnosis not present

## 2022-01-08 MED ORDER — FREESTYLE LIBRE 2 READER DEVI: 1.0000 | 0 refills | Status: DC

## 2022-01-08 MED ORDER — FREESTYLE LIBRE 2 SENSOR MISC: 2.0000 | 12 refills | Status: DC

## 2022-01-08 MED ORDER — METFORMIN HCL 500 MG PO TABS: 250.0000 mg | ORAL_TABLET | Freq: Every day | ORAL | 1 refills | Status: DC

## 2022-01-08 NOTE — Patient Instructions (Addendum)
Please switch to feta cheese or low fat mozzarella cheese  Try to switch to more olive oil based salad dressing- avoid creamy  Start metformin 1/2 tablet every morning - take after breakfast  Check sugars twice daily x 2 weeks- check in AM prior to eating and at bedtime  Normal blood sugar 70-100- contact provider if < 70 or > 400  If blood sugar < 70, drink orange juice or eat sugar pack- recheck sugars 1 hour later

## 2022-01-08 NOTE — Progress Notes (Signed)
Careteam: Patient Care Team: Octavia Heir, NP as PCP - General (Adult Health Nurse Practitioner) Jodelle Red, MD as PCP - Cardiology (Cardiology) [name], MD as Consulting Physician (Ophthalmology)  Seen by: [name], AGNP-C  PLACE OF SERVICE:  [facility]  Advanced Directive information    Allergies  Allergen Reactions   Statins Other (See Comments)    Myalgias with crestor even when taken only a few times per week   Dairycare [Lactase-Lactobacillus]     Stomach upset    Erythromycin Itching    SEVERE ITCHING   Other Other (See Comments)    NO "ACIDIC" FOODS!! They cause GI upset   Pollen Extract-Tree Extract [Pollen Extract]    Tomato Other (See Comments)    Causes GI upset   Zyrtec [Cetirizine Hcl]     Increased sleepiness    Chief Complaint  Patient presents with   Diabetes     HPI: Patient is a 88 y.o. female seen today for medical management of chronic conditions.   Lab work discussed.   LDL slightly elevated. She would like to try dietary changes first. Concerned about increasing Crestor and leg cramps.   Alc has trended up this year, even with dietary precautions. She is willing to try small dose metformin. Discussed limiting breads/pasta/sugar in diet. Hypoglycemic precautions also discussed.   Still taking amlodipine for elevated blood pressures. Home blood pressures averaging < 130/80. Following low sodium diet.   No recent chest pain.   No recent falls or injuries.     Review of Systems:  Review of Systems  Constitutional:  Negative for chills, fever, malaise/fatigue and weight loss.  HENT:  Negative for hearing loss and sore throat.   Eyes:  Negative for blurred vision and double vision.  Respiratory:  Negative for cough, shortness of breath and wheezing.   Cardiovascular:  Positive for leg swelling. Negative for chest pain.  Gastrointestinal:  Negative for abdominal pain, blood in stool, constipation, diarrhea, heartburn,  nausea and vomiting.  Genitourinary:  Negative for dysuria and frequency.  Musculoskeletal:  Negative for falls and joint pain.  Skin:  Negative for rash.  Neurological:  Negative for dizziness, weakness and headaches.  Psychiatric/Behavioral:  Negative for depression. The patient is nervous/anxious. The patient does not have insomnia.     Past Medical History:  Diagnosis Date   Allergic rhinitis    Atypical chest pain 09/10/2015   Bronchitis, acute    Dyspnea on exertion    Exudative pleural effusion 5/09    sterile, benign, inflammatory   GERD (gastroesophageal reflux disease) 09/10/2015   Systolic murmur 09/10/2015   Past Surgical History:  Procedure Laterality Date   APPENDECTOMY     CATARACT EXTRACTION W/ INTRAOCULAR LENS IMPLANT Bilateral 12/2018   OOPHORECTOMY Right    thoracentesis left  5/09   Social History:   reports that she has never smoked. She has never used smokeless tobacco. She reports that she does not drink alcohol and does not use drugs.  Family History  Problem Relation Age of Onset   Allergies Mother    Heart disease Mother 37   Cancer Mother    Dementia Mother    Heart disease Father 10   Thyroid cancer Sister    Dementia Sister 26    Medications: Patient's Medications  New Prescriptions   No medications on file  Previous Medications   AMLODIPINE (NORVASC) 2.5 MG TABLET    Take 1 tablet (2.5 mg total) by mouth daily.  CALCIUM CARB-CHOLECALCIFEROL (CALCIUM + D3 PO)    Take 1 tablet by mouth daily.   COENZYME Q10 100 MG TABS    Take by mouth.   EZETIMIBE (ZETIA) 10 MG TABLET    Take 1 tablet (10 mg total) by mouth every other day.   FLUTICASONE (FLONASE) 50 MCG/ACT NASAL SPRAY    Place 1 spray into both nostrils daily.   MULTIPLE VITAMINS-MINERALS (MULTIVITAMIN WOMEN 50+) TABS    Take 1 tablet by mouth daily.   PROBIOTIC PRODUCT (UP4 PROBIOTICS PO)    Take 1 tablet by mouth daily.   PROPYLENE GLYCOL (SYSTANE BALANCE OP)    Apply 1 drop to eye daily  as needed (dry eyes).   ROSUVASTATIN (CRESTOR) 5 MG TABLET    TAKE 2 TABLETS (10 MG TOTAL) BY MOUTH EVERY OTHER DAY.  Modified Medications   No medications on file  Discontinued Medications   No medications on file    Physical Exam:  Vitals:   01/08/22 1441  BP: 114/68  Pulse: 81  Temp: 98.4 F (36.9 C)  TempSrc: Oral  SpO2: 94%  Weight: 157 lb 12.8 oz (71.6 kg)  Height: 5\' 2"  (1.575 m)   Body mass index is 28.86 kg/m. Wt Readings from Last 3 Encounters:  01/08/22 157 lb 12.8 oz (71.6 kg)  12/22/21 156 lb (70.8 kg)  12/11/21 156 lb (70.8 kg)    Physical Exam Vitals reviewed.  Constitutional:      General: She is not in acute distress. HENT:     Head: Normocephalic.  Eyes:     General:        Right eye: No discharge.        Left eye: No discharge.  Cardiovascular:     Rate and Rhythm: Normal rate and regular rhythm.     Pulses: Normal pulses.     Heart sounds: Normal heart sounds.  Pulmonary:     Effort: Pulmonary effort is normal. No respiratory distress.     Breath sounds: Normal breath sounds. No wheezing.  Abdominal:     General: Bowel sounds are normal. There is no distension.     Palpations: Abdomen is soft.     Tenderness: There is no abdominal tenderness.  Musculoskeletal:     Cervical back: Neck supple.     Right lower leg: Edema present.     Left lower leg: Edema present.     Comments: Non pitting  Skin:    General: Skin is warm and dry.     Capillary Refill: Capillary refill takes less than 2 seconds.  Neurological:     General: No focal deficit present.     Mental Status: She is alert and oriented to person, place, and time.     Motor: No weakness.     Gait: Gait normal.  Psychiatric:        Mood and Affect: Mood normal.        Behavior: Behavior normal.     Labs reviewed: Basic Metabolic Panel: Recent Labs    06/30/21 1015 11/26/21 1050 01/02/22 1017  NA 142 138 140  K 4.2 3.8 4.2  CL 106 108 106  CO2 25 24 24   GLUCOSE 113*  104* 100  BUN 17 17 19   CREATININE 0.84 0.89 0.79  CALCIUM 9.7 8.9 9.3   Liver Function Tests: Recent Labs    06/30/21 1015 11/26/21 1050 01/02/22 1017  AST 18 18 16   ALT 19 14 14   ALKPHOS  --  56  --  BILITOT 0.5 0.5 0.5  PROT 6.6 5.6* 6.3  ALBUMIN  --  3.4*  --    No results for input(s): "LIPASE", "AMYLASE" in the last 8760 hours. No results for input(s): "AMMONIA" in the last 8760 hours. CBC: Recent Labs    06/30/21 1015 11/26/21 0828 11/27/21 0209 01/02/22 1017  WBC 6.2 6.8  --  6.6  NEUTROABS 3,243  --   --  3,808  HGB 15.4 15.6* 13.8 15.4  HCT 46.3* 46.4* 39.9 45.4*  MCV 88.7 86.6  --  87.5  PLT 302 244  --  258   Lipid Panel: Recent Labs    06/30/21 1015 01/02/22 1017  CHOL 201* 150  HDL 51 53  LDLCALC 124* 77  TRIG 143 110  CHOLHDL 3.9 2.8   TSH: No results for input(s): "TSH" in the last 8760 hours. A1C: Lab Results  Component Value Date   HGBA1C 6.9 (H) 01/02/2022     Assessment/Plan 1. Controlled type 2 diabetes mellitus with diabetic neuropathy, without long-term current use of insulin (HCC) - A1c 6.9, was 6.5 (06/2021) - will start low dose metformin - recheck A1c in 3 months - advised to check sugars BID- report to PCP if < 70 or > 400 - metFORMIN (GLUCOPHAGE) 500 MG tablet; Take 0.5 tablets (250 mg total) by mouth daily with breakfast.  Dispense: 90 tablet; Refill: 1 - Continuous Blood Gluc Receiver (FREESTYLE LIBRE 2 READER) DEVI; 1 Device by Does not apply route as directed. Check blood sugar 1-2 times daily E11.40  Dispense: 1 each; Refill: 0 - Continuous Blood Gluc Sensor (FREESTYLE LIBRE 2 SENSOR) MISC; 2 Devices by Does not apply route as directed. Check blood sugar 1-2 times daily E11.40  Dispense: 2 each; Refill: 12  2. Atypical chest pain - no recent episodes - hospitalized 07/26-07/27 - CT chest negative PE, troponin unremarkable, BNP 7 - EKG NSR, no STEMI - followed by cardiology- f/u in 3 months  3. Mixed  hyperlipidemia - LDL 124 - cont Crestor and Zetia QOD - limit dairy, processed/fried foods in diet  4. Essential hypertension - BUN/creat 19/0.79 01/02/2022 - cont amlodipine  Total time: 32 minutes. Greater than 50% of total time spent doing patient education regarding health maintenance, T2DM, HTN, and medication management.    Next appt: 04/09/2022  [name], Juel Burrow  [facility] [phone]

## 2022-01-12 ENCOUNTER — Telehealth: Payer: Self-pay

## 2022-01-12 DIAGNOSIS — E1140 Type 2 diabetes mellitus with diabetic neuropathy, unspecified: Secondary | ICD-10-CM

## 2022-01-12 NOTE — Telephone Encounter (Signed)
She may check sugars 2x/daily

## 2022-01-12 NOTE — Telephone Encounter (Signed)
Patient called and states that pharmacy needs clarification on the [facility] 2 or another order send into pharmacy.

## 2022-01-13 MED ORDER — FREESTYLE LIBRE 2 READER DEVI: 1.0000 | 0 refills | Status: DC

## 2022-01-13 MED ORDER — FREESTYLE LIBRE 2 SENSOR MISC: 2.0000 | 12 refills | Status: DC

## 2022-01-13 NOTE — Telephone Encounter (Signed)
I originally placed the order for the glucometer and put 1-2 times daily as that was the verbal instructions given to me.  I have now updated rx's to reflect two times daily and resubmitted electronically.  No further action required.

## 2022-01-21 ENCOUNTER — Encounter: Payer: Self-pay | Admitting: Family

## 2022-01-21 ENCOUNTER — Telehealth (INDEPENDENT_AMBULATORY_CARE_PROVIDER_SITE_OTHER): Payer: Medicare PPO | Admitting: Family

## 2022-01-21 DIAGNOSIS — J069 Acute upper respiratory infection, unspecified: Secondary | ICD-10-CM

## 2022-01-21 DIAGNOSIS — U071 COVID-19: Secondary | ICD-10-CM | POA: Diagnosis not present

## 2022-01-21 MED ORDER — NIRMATRELVIR/RITONAVIR (PAXLOVID)TABLET: 3.0000 | ORAL_TABLET | Freq: Two times a day (BID) | ORAL | 0 refills | Status: AC

## 2022-01-21 NOTE — Progress Notes (Signed)
This service is provided via telemedicine  No vital signs collected/recorded due to the encounter was a telemedicine visit.   Location of patient (ex: home, work):  Home  Patient consents to a telephone visit:  Yes  Location of the provider (ex: office, home):  [employer].  Name of any referring provider:  Octavia Heir, NP   Names of all persons participating in the telemedicine service and their role in the encounter:  Patient, Meda Klinefelter, RMA, Ethan Kasperski, Carilyn Goodpasture, NP.    Time spent on call: 8 minutes spent on the phone with Medical Assistant.      Provider: Janiaya Ryser FNP-C  Octavia Heir, NP  Patient Care Team: Octavia Heir, NP as PCP - General (Adult Health Nurse Practitioner) Jodelle Red, MD as PCP - Cardiology (Cardiology) [name], MD as Consulting Physician (Ophthalmology)  Extended Emergency Contact Information Primary Emergency Contact: Adriana Reams [place] Mobile Phone: [phone] Relation: Friend  Code Status:  Full Code  Goals of care: Advanced Directive information    01/21/2022    2:09 PM  Advanced Directives  Does Patient Have a Medical Advance Directive? Yes  Type of Estate agent of [place];Living will  Does patient want to make changes to medical advance directive? No - Patient declined  Copy of Healthcare Power of Attorney in Chart? Yes - validated most recent copy scanned in chart (See row information)     Chief Complaint  Patient presents with   Acute Visit    Patient states she has Positive Covid Test. Patient states her symptoms are cough, runny nose, nasal congestion, and body aches.     HPI:  Pt is a 88 y.o. female seen today for an acute visit for evaluation of COVID-19 positive test 01/21/2022.States Her house mate gave a friend a ride who has symptoms of COVID-19. She complains of sore throat,cough,nasal congestion,sinus pressure,runny nose and generalized  body aches.does not feel like doing at all. Temp was 99.4 today.  Cough described as non-productive.Has taken Robitussin with relief.  She denies any chest tightness,chest pain,palpitation or shortness of breath.    Past Medical History:  Diagnosis Date   Allergic rhinitis    Atypical chest pain 09/10/2015   Bronchitis, acute    Dyspnea on exertion    Exudative pleural effusion 5/09    sterile, benign, inflammatory   GERD (gastroesophageal reflux disease) 09/10/2015   Systolic murmur 09/10/2015   Past Surgical History:  Procedure Laterality Date   APPENDECTOMY     CATARACT EXTRACTION W/ INTRAOCULAR LENS IMPLANT Bilateral 12/2018   OOPHORECTOMY Right    thoracentesis left  5/09    Allergies  Allergen Reactions   Statins Other (See Comments)    Myalgias with crestor even when taken only a few times per week   Dairycare [Lactase-Lactobacillus]     Stomach upset    Erythromycin Itching    SEVERE ITCHING   Other Other (See Comments)    NO "ACIDIC" FOODS!! They cause GI upset   Pollen Extract-Tree Extract [Pollen Extract]    Tomato Other (See Comments)    Causes GI upset   Zyrtec [Cetirizine Hcl]     Increased sleepiness    Outpatient Encounter Medications as of 01/21/2022  Medication Sig   amLODipine (NORVASC) 2.5 MG tablet Take 1 tablet (2.5 mg total) by mouth daily.   Calcium Carb-Cholecalciferol (CALCIUM + D3 PO) Take 1 tablet by mouth daily.   Coenzyme Q10 100 MG TABS Take  by mouth.   Continuous Blood Gluc Receiver (FREESTYLE LIBRE 2 READER) DEVI 1 Device by Does not apply route as directed. Check blood sugar two times daily E11.40   Continuous Blood Gluc Sensor (FREESTYLE LIBRE 2 SENSOR) MISC 2 Devices by Does not apply route as directed. Check blood sugar two times daily E11.40   ezetimibe (ZETIA) 10 MG tablet Take 1 tablet (10 mg total) by mouth every other day.   fluticasone (FLONASE) 50 MCG/ACT nasal spray Place 1 spray into both nostrils as needed for allergies or  rhinitis.   metFORMIN (GLUCOPHAGE) 500 MG tablet Take 0.5 tablets (250 mg total) by mouth daily with breakfast.   Multiple Vitamins-Minerals (MULTIVITAMIN WOMEN 50+) TABS Take 1 tablet by mouth daily.   Probiotic Product (UP4 PROBIOTICS PO) Take 1 tablet by mouth daily.   Propylene Glycol (SYSTANE BALANCE OP) Apply 1 drop to eye daily as needed (dry eyes).   rosuvastatin (CRESTOR) 5 MG tablet TAKE 2 TABLETS (10 MG TOTAL) BY MOUTH EVERY OTHER DAY.   [DISCONTINUED] fluticasone (FLONASE) 50 MCG/ACT nasal spray Place 1 spray into both nostrils daily. (Patient taking differently: Place 1 spray into both nostrils daily as needed for allergies.)   No facility-administered encounter medications on file as of 01/21/2022.    Review of Systems  Constitutional:  Positive for fatigue and fever. Negative for appetite change and chills.  HENT:  Positive for congestion, rhinorrhea, sinus pressure and sore throat. Negative for sinus pain and sneezing.   Respiratory:  Positive for cough. Negative for chest tightness, shortness of breath and wheezing.   Cardiovascular:  Negative for chest pain, palpitations and leg swelling.  Gastrointestinal:  Negative for abdominal distention, abdominal pain, diarrhea, nausea and vomiting.  Neurological:  Negative for dizziness, light-headedness and headaches.    Immunization History  Administered Date(s) Administered   DTaP 11/16/2019   Fluad Quad(high Dose 65+) 02/23/2019, 01/02/2021   Influenza Split 02/02/2011, 04/03/2012   Influenza, High Dose Seasonal PF 03/12/2017, 04/11/2018, 03/04/2020   PFIZER(Purple Top)SARS-COV-2 Vaccination 06/26/2019, 07/28/2019, 02/09/2020, 10/14/2020   Pneumococcal Conjugate-13 04/30/2015   Pneumococcal Polysaccharide-23 03/09/2019   Tdap 11/16/2019   Zoster Recombinat (Shingrix) 01/24/2020, 03/25/2020   Pertinent  Health Maintenance Due  Topic Date Due   OPHTHALMOLOGY EXAM  Never done   INFLUENZA VACCINE  12/02/2021   HEMOGLOBIN  A1C  07/03/2022   FOOT EXAM  07/04/2022   DEXA SCAN  Completed      11/26/2021    3:00 PM 11/26/2021    8:51 PM 11/27/2021    2:00 PM 12/11/2021    2:43 PM 01/21/2022    2:08 PM  Fall Risk  Falls in the past year?    0 0  Was there an injury with Fall?    0 0  Fall Risk Category Calculator    0 0  Fall Risk Category    Low Low  Patient Fall Risk Level Moderate fall risk Moderate fall risk Moderate fall risk Low fall risk Low fall risk  Patient at Risk for Falls Due to    No Fall Risks No Fall Risks  Fall risk Follow up     Falls evaluation completed   Functional Status Survey:    There were no vitals filed for this visit. There is no height or weight on file to calculate BMI.  Physical Exam Unable to complete on telephone visit.  Labs reviewed: Recent Labs    06/30/21 1015 11/26/21 1050 01/02/22 1017  NA 142 138 140  K  4.2 3.8 4.2  CL 106 108 106  CO2 25 24 24   GLUCOSE 113* 104* 100  BUN 17 17 19   CREATININE 0.84 0.89 0.79  CALCIUM 9.7 8.9 9.3   Recent Labs    06/30/21 1015 11/26/21 1050 01/02/22 1017  AST 18 18 16   ALT 19 14 14   ALKPHOS  --  56  --   BILITOT 0.5 0.5 0.5  PROT 6.6 5.6* 6.3  ALBUMIN  --  3.4*  --    Recent Labs    06/30/21 1015 11/26/21 0828 11/27/21 0209 01/02/22 1017  WBC 6.2 6.8  --  6.6  NEUTROABS 3,243  --   --  3,808  HGB 15.4 15.6* 13.8 15.4  HCT 46.3* 46.4* 39.9 45.4*  MCV 88.7 86.6  --  87.5  PLT 302 244  --  258   Lab Results  Component Value Date   TSH 4.84 (H) 10/18/2018   Lab Results  Component Value Date   HGBA1C 6.9 (H) 01/02/2022   Lab Results  Component Value Date   CHOL 150 01/02/2022   HDL 53 01/02/2022   LDLCALC 77 01/02/2022   TRIG 110 01/02/2022   CHOLHDL 2.8 01/02/2022    Significant Diagnostic Results in last 30 days:  No results found.  Assessment/Plan  1. Positive self-administered antigen test for COVID-19 COVID-19 test was positive  Reports sore throat,cough,nasal congestion,sinus  pressure,runny nose and generalized body ache Discussed starting on Paxlovid to relieve symptoms.  Patient agrees.  GFR 73 - nirmatrelvir/ritonavir EUA (PAXLOVID) 20 x 150 MG & 10 x 100MG  TABS; Take 3 tablets by mouth 2 (two) times daily for 5 days. (Take nirmatrelvir 150 mg two tablets twice daily for 5 days and ritonavir 100 mg one tablet twice daily for 5 days) Patient GFR is 73  Dispense: 30 tablet; Refill: 0  2. Upper respiratory tract infection due to COVID-19 virus Afebrile  Start on Paxlovid as below - nirmatrelvir/ritonavir EUA (PAXLOVID) 20 x 150 MG & 10 x 100MG  TABS; Take 3 tablets by mouth 2 (two) times daily for 5 days. (Take nirmatrelvir 150 mg two tablets twice daily for 5 days and ritonavir 100 mg one tablet twice daily for 5 days) Patient GFR is 73  Dispense: 30 tablet; Refill: 0 Advised to notify provider or go to the ED if symptoms worsen   Family/ staff Communication: Reviewed plan of care with patient verbalized understanding  Labs/tests ordered: None   Next Appointment: Return if symptoms worsen or fail to improve.    I connected with  Jermeka Schlotterbeck on 01/21/22 by a Telephone enabled telemedicine application and verified that I am speaking with the correct person using two identifiers.   I discussed the limitations of evaluation and management by telemedicine. The patient expressed understanding and agreed to proceed.   Spent 17 minutes of non-face to face with patient  >50% time spent counseling; reviewing medical record; tests; labs; and developing future plan of care.    Sandrea Hughs, NP

## 2022-01-21 NOTE — Patient Instructions (Addendum)
-   Take Zinc 50 mg tablet one by mouth daily for 14 days  - Take Vitamin C 500 mg tablet one by mouth twice daily x 14 days  - Take vitamin D 2000 units  supplement x 14 days  - Tylenol as needed for fever or body aches  - over the counter Mucinex as needed for cough  - increase your fruits intake in your diet  - increase your water intake to 6-8 glasses of water daily  - Hold Crestor 5 mg tablet ,Zetia and Q 10 supplement  for 5 days while on Paxlovid  - Notify provider or go to ED if you develop any chest tightness,chest pain or shortness of breath

## 2022-02-05 ENCOUNTER — Other Ambulatory Visit: Payer: Self-pay | Admitting: Orthopedic Surgery

## 2022-02-05 DIAGNOSIS — I10 Essential (primary) hypertension: Secondary | ICD-10-CM

## 2022-03-11 DIAGNOSIS — K219 Gastro-esophageal reflux disease without esophagitis: Secondary | ICD-10-CM | POA: Diagnosis not present

## 2022-03-11 DIAGNOSIS — E1136 Type 2 diabetes mellitus with diabetic cataract: Secondary | ICD-10-CM | POA: Diagnosis not present

## 2022-03-11 DIAGNOSIS — Z809 Family history of malignant neoplasm, unspecified: Secondary | ICD-10-CM | POA: Diagnosis not present

## 2022-03-11 DIAGNOSIS — I10 Essential (primary) hypertension: Secondary | ICD-10-CM | POA: Diagnosis not present

## 2022-03-11 DIAGNOSIS — Z7984 Long term (current) use of oral hypoglycemic drugs: Secondary | ICD-10-CM | POA: Diagnosis not present

## 2022-03-11 DIAGNOSIS — M1990 Unspecified osteoarthritis, unspecified site: Secondary | ICD-10-CM | POA: Diagnosis not present

## 2022-03-11 DIAGNOSIS — E785 Hyperlipidemia, unspecified: Secondary | ICD-10-CM | POA: Diagnosis not present

## 2022-03-11 DIAGNOSIS — R32 Unspecified urinary incontinence: Secondary | ICD-10-CM | POA: Diagnosis not present

## 2022-03-11 DIAGNOSIS — Z8249 Family history of ischemic heart disease and other diseases of the circulatory system: Secondary | ICD-10-CM | POA: Diagnosis not present

## 2022-03-20 ENCOUNTER — Telehealth: Payer: Self-pay

## 2022-03-20 DIAGNOSIS — E1140 Type 2 diabetes mellitus with diabetic neuropathy, unspecified: Secondary | ICD-10-CM

## 2022-03-20 MED ORDER — BLOOD GLUCOSE METER KIT: PACK | 0 refills | Status: AC

## 2022-03-20 NOTE — Telephone Encounter (Signed)
Patient called and stated that [employer] would not pay for the freestyle libre 2 reader and requested for a glucose meter, test, and lancets to be send into pharmacy. Per patient request the a glucose meter and supplies was send into pharmacy.

## 2022-03-23 ENCOUNTER — Telehealth: Payer: Self-pay

## 2022-03-23 NOTE — Telephone Encounter (Addendum)
Patient called stating we sent over a rx last week for a glucometer and we need to send a sperate rx for the lancets.  Patient will get the [facility] Monitor   I called the pharmacy, spoke with Revonda Standard and she confirmed that the order sent on 03/20/22 includes a meter, test strips, and lancets. Revonda Standard stated they did not have the lancets in stock and had to order, lancets are now available for pick up. Revonda Standard apologized that this was not relayed properly to the patient.  I called the patient and shared the above encounter with her and she verbalized understanding.

## 2022-03-26 ENCOUNTER — Other Ambulatory Visit: Payer: Self-pay | Admitting: Orthopedic Surgery

## 2022-03-26 DIAGNOSIS — E782 Mixed hyperlipidemia: Secondary | ICD-10-CM

## 2022-03-30 ENCOUNTER — Encounter (HOSPITAL_BASED_OUTPATIENT_CLINIC_OR_DEPARTMENT_OTHER): Payer: Self-pay | Admitting: Cardiology

## 2022-03-30 ENCOUNTER — Ambulatory Visit (HOSPITAL_BASED_OUTPATIENT_CLINIC_OR_DEPARTMENT_OTHER): Payer: Medicare PPO | Admitting: Cardiology

## 2022-03-30 VITALS — BP 120/72 | HR 93 | Ht 62.0 in | Wt 153.0 lb

## 2022-03-30 DIAGNOSIS — Z7189 Other specified counseling: Secondary | ICD-10-CM | POA: Diagnosis not present

## 2022-03-30 DIAGNOSIS — E782 Mixed hyperlipidemia: Secondary | ICD-10-CM

## 2022-03-30 DIAGNOSIS — I2510 Atherosclerotic heart disease of native coronary artery without angina pectoris: Secondary | ICD-10-CM

## 2022-03-30 DIAGNOSIS — Z8249 Family history of ischemic heart disease and other diseases of the circulatory system: Secondary | ICD-10-CM

## 2022-03-30 NOTE — Patient Instructions (Signed)
Medication Instructions:  The current medical regimen is effective;  continue present plan and medications.   *If you need a refill on your cardiac medications before your next appointment, please call your pharmacy*   Lab Work: None   Testing/Procedures: None   Follow-Up: At [facility], you and your health needs are our priority.  As part of our continuing mission to provide you with exceptional heart care, we have created designated Provider Care Teams.  These Care Teams include your primary Cardiologist (physician) and Advanced Practice Providers (APPs -  Physician Assistants and Nurse Practitioners) who all work together to provide you with the care you need, when you need it.  We recommend signing up for the patient portal called "MyChart".  Sign up information is provided on this After Visit Summary.  MyChart is used to connect with patients for Virtual Visits (Telemedicine).  Patients are able to view lab/test results, encounter notes, upcoming appointments, etc.  Non-urgent messages can be sent to your provider as well.   To learn more about what you can do with MyChart, go to [email or web address].    Your next appointment:   6 month(s)  The format for your next appointment:   In Person  Provider:   Bridgette Christopher, MD    Other Instructions None       

## 2022-03-30 NOTE — Progress Notes (Signed)
Cardiology Office Note:    Date:  03/30/2022   ID:  Sara Charles, DOB 1933-10-17, MRN [id number]  PCP:  Yvonna Alanis, NP  Cardiologist:  Buford Dresser, MD  Referring MD: Yvonna Alanis, NP   CC: Follow-up for atypical chest pain and family history  History of Present Illness:    Sara Charles is a 88 y.o. female with a hx of systolic murmur, hyperlipidemia, diabetes, who is seen for follow-up. She was initially seen 12/22/2021 as a new consult at the request of [place], Amy E, NP for the evaluation and management of atypical chest pain.  She was admitted to the hospital 11/26/2021 with left chest pain worse with movement. She was found to have transient hypoxia and elevated blood pressure in the ED. CT angiogram was done which was negative for pulmonary embolism or acute pulmonary issue. Hemoglobin was 15.6. Troponins were negative. EKG was unremarkable. It was felt she would benefit from outpatient monitoring and initiation of antihypertensive as outpatient. Following her discharge she requested a referral to cardiology.  Previously followed by Dr. Verl Blalock in cardiology. Last seen by Dr. Oval Linsey 09/10/2015 where she was noted to have a mild systolic murmur. An echo (09/2015) was ordered and showed that her heart squeezes well but does not relax completely. There was trivial leaking of the mitral, tricuspid, and aortic valves.  Family history: Her father died at 55 yo of a heart attack. Her mother had CABG x5 and 5 stent placements.   At her last visit, her symptoms were largely atypical. We discussed options for further evaluation. After shared decision making, we planned to monitor and reassess in 3 months.  Today, she presents a BP and blood sugar log which is personally reviewed. Overall they average 110s-120s/50s-70s. Her pulse is mostly in the 60s-70s.  Lately she is not feeling very differently. She might not be as fatigued as she was at her last visit. No issues with chest  pain, discomfort, or palpitations. At baseline she considers her breathing to be shallow most of the time. Her LE edema is not occurring as frequently on her current regimen.  She reports that there was concerns regarding a recent EKG. We reviewed her prior EKGs and reassured her that her heart rhythm appears normal with no acute changes.  She denies any lightheadedness, headaches, syncope, orthopnea, or PND.  After waking up she sometimes notices dark skin discoloration along the bridge of her nose, medially to her bilateral eyes.   Past Medical History:  Diagnosis Date   Allergic rhinitis    Atypical chest pain 09/10/2015   Bronchitis, acute    Dyspnea on exertion    Exudative pleural effusion 5/09    sterile, benign, inflammatory   GERD (gastroesophageal reflux disease) 01/09/3381   Systolic murmur 5/0/5397    Past Surgical History:  Procedure Laterality Date   APPENDECTOMY     CATARACT EXTRACTION W/ INTRAOCULAR LENS IMPLANT Bilateral 12/2018   OOPHORECTOMY Right    thoracentesis left  5/09    Current Medications: Current Outpatient Medications on File Prior to Visit  Medication Sig   amLODipine (NORVASC) 2.5 MG tablet TAKE 1 TABLET BY MOUTH EVERY DAY   blood glucose meter kit and supplies Dispense based on patient and insurance preference. Use up to four times daily as directed. (FOR ICD-10 E10.9, E11.9).   Calcium Carb-Cholecalciferol (CALCIUM + D3 PO) Take 1 tablet by mouth daily.   Coenzyme Q10 100 MG TABS Take by mouth.   ezetimibe (  ZETIA) 10 MG tablet Take 1 tablet (10 mg total) by mouth every other day.   fluticasone (FLONASE) 50 MCG/ACT nasal spray Place 1 spray into both nostrils as needed for allergies or rhinitis.   metFORMIN (GLUCOPHAGE) 500 MG tablet Take 0.5 tablets (250 mg total) by mouth daily with breakfast.   Multiple Vitamins-Minerals (MULTIVITAMIN WOMEN 50+) TABS Take 1 tablet by mouth daily.   Probiotic Product (UP4 PROBIOTICS PO) Take 1 tablet by mouth  daily.   Propylene Glycol (SYSTANE BALANCE OP) Apply 1 drop to eye daily as needed (dry eyes).   rosuvastatin (CRESTOR) 5 MG tablet TAKE 2 TABLETS (10 MG TOTAL) BY MOUTH EVERY OTHER DAY.   No current facility-administered medications on file prior to visit.     Allergies:   Statins, Dairycare [lactase-lactobacillus], Erythromycin, Other, Pollen extract-tree extract [pollen extract], Tomato, and Zyrtec [cetirizine hcl]   Social History   Tobacco Use   Smoking status: Never   Smokeless tobacco: Never  Vaping Use   Vaping Use: Never used  Substance Use Topics   Alcohol use: No   Drug use: No    Family History: family history includes Allergies in her mother; Cancer in her mother; Dementia in her mother; Dementia (age of onset: 36) in her sister; Heart disease (age of onset: 66) in her father; Heart disease (age of onset: 26) in her mother; Thyroid cancer in her sister.  ROS:   Please see the history of present illness. (+) Fatigue All other systems are reviewed and negative.   EKGs/Labs/Other Studies Reviewed:    The following studies were reviewed today:  Echocardiogram  11/27/2021: Sonographer Comments: Image acquisition challenging due to respiratory  motion.  IMPRESSIONS    1. Left ventricular ejection fraction, by estimation, is 60 to 65%. The  left ventricle has normal function. The left ventricle has no regional  wall motion abnormalities. Left ventricular diastolic parameters are  consistent with Grade I diastolic  dysfunction (impaired relaxation).   2. Right ventricular systolic function is normal. The right ventricular  size is normal. There is normal pulmonary artery systolic pressure. The  estimated right ventricular systolic pressure is 31.4 mmHg.   3. The mitral valve is normal in structure. Trivial mitral valve  regurgitation. No evidence of mitral stenosis.   4. The aortic valve is tricuspid. There is moderate calcification of the  aortic valve. Aortic  valve regurgitation is trivial. Aortic valve  sclerosis/calcification is present, without any evidence of aortic  stenosis.   5. The inferior vena cava is normal in size with greater than 50%  respiratory variability, suggesting right atrial pressure of 3 mmHg.   CTA Chest  11/26/2021: IMPRESSION: 1. Negative for pulmonary embolism or other acute intrathoracic findings. 2. Small hiatal hernia. 3. Benign 2.0 cm left adrenal gland nodule, stable from 2018 and likely representing an adenoma. 4. Aortic atherosclerosis (ICD10-I70.0).   EKG:  EKG is personally reviewed.   03/30/2022:  EKG was not ordered. 12/22/2021:  SR with 1st degree AV block at 78 bpm  Recent Labs: 11/26/2021: B Natriuretic Peptide 7.3 01/02/2022: ALT 14; BUN 19; Creat 0.79; Hemoglobin 15.4; Platelets 258; Potassium 4.2; Sodium 140   Recent Lipid Panel    Component Value Date/Time   CHOL 150 01/02/2022 1017   TRIG 110 01/02/2022 1017   HDL 53 01/02/2022 1017   CHOLHDL 2.8 01/02/2022 1017   VLDL 29 09/12/2017 0026   LDLCALC 77 01/02/2022 1017    Physical Exam:    VS:  BP  120/72   Pulse 93   Ht _0  (1.575 m)   Wt 153 lb (69.4 kg)   SpO2 92%   BMI 27.98 kg/m     Wt Readings from Last 3 Encounters:  03/30/22 153 lb (69.4 kg)  01/08/22 157 lb 12.8 oz (71.6 kg)  12/22/21 156 lb (70.8 kg)    GEN: Well nourished, well developed in no acute distress HEENT: Normal, moist mucous membranes NECK: No JVD CARDIAC: regular rhythm, normal S1 and S2, no rubs or gallops. No murmur appreciated today. VASCULAR: Radial and DP pulses 2+ bilaterally. No carotid bruits RESPIRATORY:  Clear to auscultation without rales, wheezing or rhonchi  ABDOMEN: Soft, non-tender, non-distended MUSCULOSKELETAL:  Ambulates independently SKIN: Warm and dry, no significant LE edema NEUROLOGIC:  Alert and oriented x 3. No focal neuro deficits noted. PSYCHIATRIC:  Normal affect    ASSESSMENT:    1. Mixed hyperlipidemia   2. Coronary  artery calcification seen on CT scan   3. Family history of heart disease   4. Cardiac risk counseling   5. Counseling on health promotion and disease prevention     PLAN:    Atypical chest pain -resolved  Mixed hyperlipidemia Coronary calcification on CT Family history of heart disease -continue rosuvastatin -above age recommendations for aspirin  Cardiac risk counseling and prevention recommendations: -recommend heart healthy/Mediterranean diet, with whole grains, fruits, vegetable, fish, lean meats, nuts, and olive oil. Limit salt. -recommend moderate walking, 3-5 times/week for 30-50 minutes each session. Aim for at least 150 minutes.week. Goal should be pace of 3 miles/hours, or walking 1.5 miles in 30 minutes -recommend avoidance of tobacco products. Avoid excess alcohol. -ASCVD risk score: The ASCVD Risk score (Arnett DK, et al., 2019) failed to calculate for the following reasons:   The 2019 ASCVD risk score is only valid for ages 57 to 44    Plan for follow up: 6 months or sooner as needed.  Buford Dresser, MD, PhD, [facility]    Medication Adjustments/Labs and Tests Ordered: Current medicines are reviewed at length with the patient today.  Concerns regarding medicines are outlined above.   No orders of the defined types were placed in this encounter.  No orders of the defined types were placed in this encounter.  Patient Instructions  Medication Instructions:  The current medical regimen is effective;  continue present plan and medications.  *If you need a refill on your cardiac medications before your next appointment, please call your pharmacy*   Lab Work: None   Testing/Procedures: None   Follow-Up: At [facility], you and your health needs are our priority.  As part of our continuing mission to provide you with exceptional heart care, we have created designated Provider Care Teams.  These Care Teams include your  primary Cardiologist (physician) and Advanced Practice Providers (APPs -  Physician Assistants and Nurse Practitioners) who all work together to provide you with the care you need, when you need it.  We recommend signing up for the patient portal called "MyChart".  Sign up information is provided on this After Visit Summary.  MyChart is used to connect with patients for Virtual Visits (Telemedicine).  Patients are able to view lab/test results, encounter notes, upcoming appointments, etc.  Non-urgent messages can be sent to your provider as well.   To learn more about what you can do with MyChart, go to [email or web address].    Your next appointment:   6 month(s)  The format for  your next appointment:   In Person  Provider:   Buford Dresser, MD    Other Instructions None           I,Sara Charles,acting as a scribe for Buford Dresser, MD.,have documented all relevant documentation on the behalf of Buford Dresser, MD,as directed by  Buford Dresser, MD while in the presence of Buford Dresser, MD.  I, Buford Dresser, MD, have reviewed all documentation for this visit. The documentation on 03/30/22 for the exam, diagnosis, procedures, and orders are all accurate and complete.   Signed, Buford Dresser, MD PhD 03/30/2022 5:18 PM    [facility]

## 2022-04-08 ENCOUNTER — Encounter: Payer: Medicare PPO | Admitting: Orthopedic Surgery

## 2022-04-08 DIAGNOSIS — E1140 Type 2 diabetes mellitus with diabetic neuropathy, unspecified: Secondary | ICD-10-CM

## 2022-04-09 ENCOUNTER — Ambulatory Visit: Payer: Medicare PPO

## 2022-04-09 ENCOUNTER — Ambulatory Visit: Payer: Medicare PPO | Admitting: Orthopedic Surgery

## 2022-04-09 ENCOUNTER — Other Ambulatory Visit: Payer: Self-pay | Admitting: Orthopedic Surgery

## 2022-04-09 DIAGNOSIS — E1140 Type 2 diabetes mellitus with diabetic neuropathy, unspecified: Secondary | ICD-10-CM

## 2022-04-09 LAB — HEMOGLOBIN A1C
Hgb A1c MFr Bld: 6.8 % of total Hgb — ABNORMAL HIGH (ref <5.7)
Mean Plasma Glucose: 148 mg/dL
eAG (mmol/L): 8.2 mmol/L

## 2022-04-09 MED ORDER — METFORMIN HCL 500 MG PO TABS: 500.0000 mg | ORAL_TABLET | Freq: Every day | ORAL | 1 refills | Status: DC

## 2022-04-13 DIAGNOSIS — H3561 Retinal hemorrhage, right eye: Secondary | ICD-10-CM | POA: Diagnosis not present

## 2022-04-13 DIAGNOSIS — Z961 Presence of intraocular lens: Secondary | ICD-10-CM | POA: Diagnosis not present

## 2022-04-13 DIAGNOSIS — H35371 Puckering of macula, right eye: Secondary | ICD-10-CM | POA: Diagnosis not present

## 2022-04-13 DIAGNOSIS — H04123 Dry eye syndrome of bilateral lacrimal glands: Secondary | ICD-10-CM | POA: Diagnosis not present

## 2022-04-16 ENCOUNTER — Ambulatory Visit: Payer: Medicare PPO | Admitting: Orthopedic Surgery

## 2022-04-17 ENCOUNTER — Encounter: Payer: Self-pay | Admitting: Orthopedic Surgery

## 2022-04-21 ENCOUNTER — Other Ambulatory Visit: Payer: Self-pay | Admitting: Orthopedic Surgery

## 2022-06-09 NOTE — Progress Notes (Signed)
This encounter was created in error - please disregard.

## 2022-06-30 ENCOUNTER — Other Ambulatory Visit: Payer: Self-pay | Admitting: Nurse Practitioner

## 2022-06-30 DIAGNOSIS — E782 Mixed hyperlipidemia: Secondary | ICD-10-CM

## 2022-07-09 ENCOUNTER — Ambulatory Visit: Payer: Medicare PPO

## 2022-07-09 DIAGNOSIS — E1140 Type 2 diabetes mellitus with diabetic neuropathy, unspecified: Secondary | ICD-10-CM | POA: Diagnosis not present

## 2022-07-09 LAB — HEMOGLOBIN A1C
Hgb A1c MFr Bld: 6.8 % of total Hgb — ABNORMAL HIGH (ref <5.7)
Mean Plasma Glucose: 148 mg/dL
eAG (mmol/L): 8.2 mmol/L

## 2022-07-16 ENCOUNTER — Encounter: Payer: Self-pay | Admitting: Orthopedic Surgery

## 2022-07-16 ENCOUNTER — Ambulatory Visit: Payer: Medicare PPO | Admitting: Orthopedic Surgery

## 2022-07-16 VITALS — BP 120/74 | HR 92 | Temp 96.6°F | Resp 16 | Ht 62.0 in | Wt 153.4 lb

## 2022-07-16 DIAGNOSIS — I10 Essential (primary) hypertension: Secondary | ICD-10-CM | POA: Diagnosis not present

## 2022-07-16 DIAGNOSIS — K219 Gastro-esophageal reflux disease without esophagitis: Secondary | ICD-10-CM

## 2022-07-16 DIAGNOSIS — F419 Anxiety disorder, unspecified: Secondary | ICD-10-CM | POA: Diagnosis not present

## 2022-07-16 DIAGNOSIS — E1169 Type 2 diabetes mellitus with other specified complication: Secondary | ICD-10-CM

## 2022-07-16 DIAGNOSIS — R5383 Other fatigue: Secondary | ICD-10-CM

## 2022-07-16 DIAGNOSIS — E785 Hyperlipidemia, unspecified: Secondary | ICD-10-CM

## 2022-07-16 DIAGNOSIS — E1140 Type 2 diabetes mellitus with diabetic neuropathy, unspecified: Secondary | ICD-10-CM

## 2022-07-16 NOTE — Patient Instructions (Addendum)
Please call eye doctor and have them send recent eye exam to our office > [phone]  Try meditation, classical music, deep breathing, light walk to calm nerves  Try to limit sweet to every other day

## 2022-07-16 NOTE — Progress Notes (Signed)
Careteam: Patient Care Team: Yvonna Alanis, NP as PCP - General (Adult Health Nurse Practitioner) Buford Dresser, MD as PCP - Cardiology (Cardiology) Calvert Cantor, MD as Consulting Physician (Ophthalmology)  Seen by: Windell Moulding, AGNP-C  PLACE OF SERVICE:  [place] Directive information Does Patient Have a Medical Advance Directive?: Yes, Type of Advance Directive: [place];Living will, Does patient want to make changes to medical advance directive?: No - Patient declined  Allergies  Allergen Reactions   Statins Other (See Comments)    Myalgias with crestor even when taken only a few times per week   Dairycare [Lactase-Lactobacillus]     Stomach upset    Erythromycin Itching    SEVERE ITCHING   Other Other (See Comments)    NO "ACIDIC" FOODS!! They cause GI upset   Pollen Extract-Tree Extract [Pollen Extract]    Tomato Other (See Comments)    Causes GI upset   Zyrtec [Cetirizine Hcl]     Increased sleepiness    Chief Complaint  Patient presents with   Medical Management of Chronic Issues    3 month follow up.    Health Maintenance    Discuss the need for Eye exam, and Foot exam.      HPI: Patient is a 89 y.o. female seen today for medical management of chronic conditions.   She continues to check blood sugar daily. Averaging 110-200's. No hypoglycemias. She remains on metformin. Eye exam recently done> Dr. Bing Plume associate> cannot remember name. Foot exam done today.   Increased anxiety> thinks it is related with age. Treatment options discussed does ot want to try medication. Non medication interventions also discussed.   Blood pressure controlled. Remains on amlodipine.   No recent falls or injuries.     Review of Systems:  Review of Systems  Constitutional:  Positive for malaise/fatigue. Negative for chills and fever.  HENT:  Negative for sore throat.   Eyes:  Negative for blurred vision and double vision.   Respiratory:  Negative for cough, shortness of breath and wheezing.   Cardiovascular:  Negative for chest pain and leg swelling.  Gastrointestinal:  Negative for abdominal pain and blood in stool.  Genitourinary:  Negative for dysuria.  Musculoskeletal:  Negative for falls and joint pain.  Skin:  Negative for rash.  Neurological:  Negative for dizziness, weakness and headaches.  Psychiatric/Behavioral:  Negative for depression and memory loss. The patient is nervous/anxious.     Past Medical History:  Diagnosis Date   Allergic rhinitis    Atypical chest pain 09/10/2015   Bronchitis, acute    Dyspnea on exertion    Exudative pleural effusion 5/09    sterile, benign, inflammatory   GERD (gastroesophageal reflux disease) [id number]   Systolic murmur [id number]   Past Surgical History:  Procedure Laterality Date   APPENDECTOMY     CATARACT EXTRACTION W/ INTRAOCULAR LENS IMPLANT Bilateral 12/2018   OOPHORECTOMY Right    thoracentesis left  5/09   Social History:   reports that she has never smoked. She has never used smokeless tobacco. She reports that she does not drink alcohol and does not use drugs.  Family History  Problem Relation Age of Onset   Allergies Mother    Heart disease Mother 98   Cancer Mother    Dementia Mother    Heart disease Father 29   Thyroid cancer Sister    Dementia Sister 58    Medications: Patient's Medications  New Prescriptions  No medications on file  Previous Medications   ACCU-CHEK SOFTCLIX LANCETS LANCETS    USE UP TO 4 TIMES DAILY AS DIRECTED   AMLODIPINE (NORVASC) 2.5 MG TABLET    TAKE 1 TABLET BY MOUTH EVERY DAY   BLOOD GLUCOSE METER KIT AND SUPPLIES    Dispense based on patient and insurance preference. Use up to four times daily as directed. (FOR ICD-10 E10.9, E11.9).   CALCIUM CARB-CHOLECALCIFEROL (CALCIUM + D3 PO)    Take 1 tablet by mouth daily.   COENZYME Q10 100 MG TABS    Take by mouth.   EZETIMIBE (ZETIA) 10 MG TABLET    TAKE 1  TABLET BY MOUTH EVERY OTHER DAY   FLUTICASONE (FLONASE) 50 MCG/ACT NASAL SPRAY    Place 1 spray into both nostrils as needed for allergies or rhinitis.   METFORMIN (GLUCOPHAGE) 500 MG TABLET    Take 1 tablet (500 mg total) by mouth daily with breakfast.   MULTIPLE VITAMINS-MINERALS (MULTIVITAMIN WOMEN 50+) TABS    Take 1 tablet by mouth daily.   PROBIOTIC PRODUCT (UP4 PROBIOTICS PO)    Take 1 tablet by mouth daily.   PROPYLENE GLYCOL (SYSTANE BALANCE OP)    Apply 1 drop to eye daily as needed (dry eyes).   ROSUVASTATIN (CRESTOR) 5 MG TABLET    TAKE 2 TABLETS (10 MG TOTAL) BY MOUTH EVERY OTHER DAY.  Modified Medications   No medications on file  Discontinued Medications   No medications on file    Physical Exam:  Vitals:   07/16/22 1424  BP: 120/74  Pulse: 92  Resp: 16  Temp: (!) 96.6 F (35.9 C)  SpO2: 91%  Weight: 153 lb 6.4 oz (69.6 kg)  Height: '5\' 2"'$  (1.575 m)   Body mass index is 28.06 kg/m. Wt Readings from Last 3 Encounters:  07/16/22 153 lb 6.4 oz (69.6 kg)  03/30/22 153 lb (69.4 kg)  01/08/22 157 lb 12.8 oz (71.6 kg)    Physical Exam Vitals reviewed.  Constitutional:      General: She is not in acute distress. HENT:     Head: Normocephalic.  Eyes:     General:        Right eye: No discharge.        Left eye: No discharge.  Cardiovascular:     Rate and Rhythm: Normal rate and regular rhythm.     Pulses:          Dorsalis pedis pulses are 1+ on the left side.       Posterior tibial pulses are 1+ on the right side and 1+ on the left side.     Heart sounds: Normal heart sounds.  Pulmonary:     Effort: Pulmonary effort is normal. No respiratory distress.     Breath sounds: Normal breath sounds. No wheezing.  Abdominal:     General: Bowel sounds are normal. There is no distension.     Palpations: Abdomen is soft.     Tenderness: There is no abdominal tenderness.  Musculoskeletal:     Cervical back: Neck supple.     Right lower leg: Edema present.     Left  lower leg: Edema present.     Right foot: Normal range of motion.     Left foot: Normal range of motion.     Comments: Non pitting  Feet:     Right foot:     Protective Sensation: 10 sites tested.  10 sites sensed.     Skin  integrity: Skin integrity normal.     Toenail Condition: Right toenails are normal.     Left foot:     Protective Sensation: 10 sites tested.  10 sites sensed.     Skin integrity: Skin integrity normal.     Toenail Condition: Left toenails are normal.  Skin:    General: Skin is warm and dry.     Capillary Refill: Capillary refill takes less than 2 seconds.  Neurological:     General: No focal deficit present.     Mental Status: She is alert and oriented to person, place, and time.  Psychiatric:        Mood and Affect: Mood normal.        Behavior: Behavior normal.     Labs reviewed: Basic Metabolic Panel: Recent Labs    11/26/21 1050 01/02/22 1017  NA 138 140  K 3.8 4.2  CL 108 106  CO2 24 24  GLUCOSE 104* 100  BUN 17 19  CREATININE 0.89 0.79  CALCIUM 8.9 9.3   Liver Function Tests: Recent Labs    11/26/21 1050 01/02/22 1017  AST 18 16  ALT 14 14  ALKPHOS 56  --   BILITOT 0.5 0.5  PROT 5.6* 6.3  ALBUMIN 3.4*  --    No results for input(s): "LIPASE", "AMYLASE" in the last 8760 hours. No results for input(s): "AMMONIA" in the last 8760 hours. CBC: Recent Labs    11/26/21 0828 11/27/21 0209 01/02/22 1017  WBC 6.8  --  6.6  NEUTROABS  --   --  3,808  HGB 15.6* 13.8 15.4  HCT 46.4* 39.9 45.4*  MCV 86.6  --  87.5  PLT 244  --  258   Lipid Panel: Recent Labs    01/02/22 1017  CHOL 150  HDL 53  LDLCALC 77  TRIG 110  CHOLHDL 2.8   TSH: No results for input(s): "TSH" in the last 8760 hours. A1C: Lab Results  Component Value Date   HGBA1C 6.8 (H) 07/09/2022     Assessment/Plan 1. Controlled type 2 diabetes mellitus with diabetic neuropathy, without long-term current use of insulin (HCC) - A1c 6.8 07/2022 - sugars  110-200's, no hypoglycemias - foot exam today - eye exam within 3 months> note requested - cont metformin   2. Essential hypertension - controlled - cont amlodipine - CBC with Differential/Platelet - Complete Metabolic Panel with eGFR  3. Hyperlipidemia associated with type 2 diabetes mellitus (HCC) - statin intolerance - cont Zetia  4. Gastroesophageal reflux disease, unspecified whether esophagitis present - hgb 15.4 (01/2022) - statin intolerance - cont Zetia  5. Fatigue, unspecified type - sleeping more  - family had thyroid issues - TSH  6. Anxiety - no panic attacks - anxious more often> believes it is related to advanced age - discussed medication, deep breathing, exercise  - not interested in trying SSRI at this time  Total time: 31 minutes. Greater than 50% of total time spent doing patient education regarding health maintenance, T2DM, HTN, HLD and anxiety including symptom/medication management.    Next appt: 11/19/2022  Windell Moulding, [facility] [phone]

## 2022-07-17 LAB — COMPLETE METABOLIC PANEL WITH GFR
AG Ratio: 2 (calc) (ref 1.0–2.5)
ALT: 12 U/L (ref 6–29)
AST: 16 U/L (ref 10–35)
Albumin: 4.3 g/dL (ref 3.6–5.1)
Alkaline phosphatase (APISO): 68 U/L (ref 37–153)
BUN: 14 mg/dL (ref 7–25)
CO2: 24 mmol/L (ref 20–32)
Calcium: 9.5 mg/dL (ref 8.6–10.4)
Chloride: 104 mmol/L (ref 98–110)
Creat: 0.88 mg/dL (ref 0.60–0.95)
Globulin: 2.2 g/dL (calc) (ref 1.9–3.7)
Glucose, Bld: 151 mg/dL — ABNORMAL HIGH (ref 65–139)
Potassium: 4.1 mmol/L (ref 3.5–5.3)
Sodium: 139 mmol/L (ref 135–146)
Total Bilirubin: 0.5 mg/dL (ref 0.2–1.2)
Total Protein: 6.5 g/dL (ref 6.1–8.1)
eGFR: 64 mL/min/{1.73_m2} (ref >=60)

## 2022-07-17 LAB — CBC WITH DIFFERENTIAL/PLATELET
Absolute Monocytes: 540 cells/uL (ref 200–950)
Basophils Absolute: 50 cells/uL (ref 0–200)
Basophils Relative: 0.7 %
Eosinophils Absolute: 149 cells/uL (ref 15–500)
Eosinophils Relative: 2.1 %
HCT: 45.7 % — ABNORMAL HIGH (ref 35.0–45.0)
Hemoglobin: 15 g/dL (ref 11.7–15.5)
Lymphs Abs: 1683 cells/uL (ref 850–3900)
MCH: 28.8 pg (ref 27.0–33.0)
MCHC: 32.8 g/dL (ref 32.0–36.0)
MCV: 87.9 fL (ref 80.0–100.0)
MPV: 10.3 fL (ref 7.5–12.5)
Monocytes Relative: 7.6 %
Neutro Abs: 4679 cells/uL (ref 1500–7800)
Neutrophils Relative %: 65.9 %
Platelets: 305 10*3/uL (ref 140–400)
RBC: 5.2 10*6/uL — ABNORMAL HIGH (ref 3.80–5.10)
RDW: 12.1 % (ref 11.0–15.0)
Total Lymphocyte: 23.7 %
WBC: 7.1 10*3/uL (ref 3.8–10.8)

## 2022-07-17 LAB — TSH: TSH: 3.34 mIU/L (ref 0.40–4.50)

## 2022-08-05 LAB — HM DIABETES EYE EXAM

## 2022-08-06 ENCOUNTER — Encounter: Payer: Self-pay | Admitting: Orthopedic Surgery

## 2022-08-19 ENCOUNTER — Other Ambulatory Visit: Payer: Self-pay | Admitting: Orthopedic Surgery

## 2022-08-19 DIAGNOSIS — I10 Essential (primary) hypertension: Secondary | ICD-10-CM

## 2022-09-10 DIAGNOSIS — H6121 Impacted cerumen, right ear: Secondary | ICD-10-CM | POA: Diagnosis not present

## 2022-09-17 DIAGNOSIS — D225 Melanocytic nevi of trunk: Secondary | ICD-10-CM | POA: Diagnosis not present

## 2022-09-17 DIAGNOSIS — L814 Other melanin hyperpigmentation: Secondary | ICD-10-CM | POA: Diagnosis not present

## 2022-09-17 DIAGNOSIS — L821 Other seborrheic keratosis: Secondary | ICD-10-CM | POA: Diagnosis not present

## 2022-09-29 ENCOUNTER — Encounter (HOSPITAL_BASED_OUTPATIENT_CLINIC_OR_DEPARTMENT_OTHER): Payer: Self-pay | Admitting: Cardiology

## 2022-09-29 ENCOUNTER — Ambulatory Visit (HOSPITAL_BASED_OUTPATIENT_CLINIC_OR_DEPARTMENT_OTHER): Payer: Medicare PPO | Admitting: Cardiology

## 2022-09-29 VITALS — BP 127/72 | HR 76 | Ht 62.0 in | Wt 150.5 lb

## 2022-09-29 DIAGNOSIS — Z8249 Family history of ischemic heart disease and other diseases of the circulatory system: Secondary | ICD-10-CM | POA: Diagnosis not present

## 2022-09-29 DIAGNOSIS — I2510 Atherosclerotic heart disease of native coronary artery without angina pectoris: Secondary | ICD-10-CM | POA: Diagnosis not present

## 2022-09-29 DIAGNOSIS — E782 Mixed hyperlipidemia: Secondary | ICD-10-CM

## 2022-09-29 DIAGNOSIS — Z7189 Other specified counseling: Secondary | ICD-10-CM

## 2022-09-29 NOTE — Progress Notes (Signed)
Cardiology Office Note:    Date:  09/29/2022   ID:  Sara Charles, DOB 1933-09-21, MRN [id number]  PCP:  Octavia Heir, NP  Cardiologist:  Jodelle Red, MD  Referring MD: Octavia Heir, NP   CC: Follow-up for atypical chest pain and family history  History of Present Illness:    Sara Charles is a 89 y.o. female with a hx of systolic murmur, hyperlipidemia, diabetes, who is seen for follow-up. She was initially seen 12/22/2021 as a new consult at the request of [place], Amy E, NP for the evaluation and management of atypical chest pain.  She was admitted to the hospital 11/26/2021 with left chest pain worse with movement. She was found to have transient hypoxia and elevated blood pressure in the ED. CT angiogram was done which was negative for pulmonary embolism or acute pulmonary issue. Hemoglobin was 15.6. Troponins were negative. EKG was unremarkable. It was felt she would benefit from outpatient monitoring and initiation of antihypertensive as outpatient. Following her discharge she requested a referral to cardiology.  Previously followed by Dr. Daleen Squibb in cardiology. Last seen by Dr. Duke Salvia 09/10/2015 where she was noted to have a mild systolic murmur. An echo (09/2015) was ordered and showed that her heart squeezes well but does not relax completely. There was trivial leaking of the mitral, tricuspid, and aortic valves.  Family history: Her father died at 74 yo of a heart attack. Her mother had CABG x5 and 5 stent placements.   At her last visit, she presented a blood pressure log which was personally reviewed. Home readings averaged 110s-120s/50s-70s with pulse mostly in the 60s-70s. She was feeling stable without chest pain or palpitations. At baseline she considered her breathing to be shallow most of the time. She reported a concern regarding her recent EKG. We reviewed her prior EKGs and reassured her that her heart rhythm appeared normal with no acute changes.  Today,  she presents a home blood pressure log which is personally reviewed, averaging 120s/low 60s. Heart rates have been stable.  Generally she is feeling alright. She recalls that previously she developed chest pain after eating spicy food. She denies any recurring chest pain.  She denies any palpitations, shortness of breath, or peripheral edema. No lightheadedness, headaches, syncope, orthopnea, or PND.   Past Medical History:  Diagnosis Date   Allergic rhinitis    Atypical chest pain 09/10/2015   Bronchitis, acute    Dyspnea on exertion    Exudative pleural effusion 5/09    sterile, benign, inflammatory   GERD (gastroesophageal reflux disease) 09/10/2015   Systolic murmur 09/10/2015    Past Surgical History:  Procedure Laterality Date   APPENDECTOMY     CATARACT EXTRACTION W/ INTRAOCULAR LENS IMPLANT Bilateral 12/2018   OOPHORECTOMY Right    thoracentesis left  5/09    Current Medications: Current Outpatient Medications on File Prior to Visit  Medication Sig   Accu-Chek Softclix Lancets lancets USE UP TO 4 TIMES DAILY AS DIRECTED   amLODipine (NORVASC) 2.5 MG tablet TAKE 1 TABLET BY MOUTH EVERY DAY   blood glucose meter kit and supplies Dispense based on patient and insurance preference. Use up to four times daily as directed. (FOR ICD-10 E10.9, E11.9).   Calcium Carb-Cholecalciferol (CALCIUM + D3 PO) Take 1 tablet by mouth daily.   Coenzyme Q10 100 MG TABS Take by mouth.   ezetimibe (ZETIA) 10 MG tablet TAKE 1 TABLET BY MOUTH EVERY OTHER DAY   fluticasone (FLONASE) 50 MCG/ACT  nasal spray Place 1 spray into both nostrils as needed for allergies or rhinitis.   metFORMIN (GLUCOPHAGE) 500 MG tablet Take 1 tablet (500 mg total) by mouth daily with breakfast.   Multiple Vitamins-Minerals (MULTIVITAMIN WOMEN 50+) TABS Take 1 tablet by mouth daily.   Probiotic Product (UP4 PROBIOTICS PO) Take 1 tablet by mouth daily.   Propylene Glycol (SYSTANE BALANCE OP) Apply 1 drop to eye daily as needed  (dry eyes).   rosuvastatin (CRESTOR) 5 MG tablet TAKE 2 TABLETS (10 MG TOTAL) BY MOUTH EVERY OTHER DAY.   No current facility-administered medications on file prior to visit.     Allergies:   Statins, Dairycare [lactase-lactobacillus], Erythromycin, Other, Pollen extract-tree extract [pollen extract], Tomato, and Zyrtec [cetirizine hcl]   Social History   Tobacco Use   Smoking status: Never   Smokeless tobacco: Never  Vaping Use   Vaping Use: Never used  Substance Use Topics   Alcohol use: No   Drug use: No    Family History: family history includes Allergies in her mother; Cancer in her mother; Dementia in her mother; Dementia (age of onset: 23) in her sister; Heart disease (age of onset: 72) in her father; Heart disease (age of onset: 66) in her mother; Thyroid cancer in her sister.  ROS:   Please see the history of present illness. All other systems are reviewed and negative.   EKGs/Labs/Other Studies Reviewed:    The following studies were reviewed today:  Echocardiogram  11/27/2021: Sonographer Comments: Image acquisition challenging due to respiratory  motion.   IMPRESSIONS   1. Left ventricular ejection fraction, by estimation, is 60 to 65%. The  left ventricle has normal function. The left ventricle has no regional  wall motion abnormalities. Left ventricular diastolic parameters are  consistent with Grade I diastolic  dysfunction (impaired relaxation).   2. Right ventricular systolic function is normal. The right ventricular  size is normal. There is normal pulmonary artery systolic pressure. The  estimated right ventricular systolic pressure is 20.6 mmHg.   3. The mitral valve is normal in structure. Trivial mitral valve  regurgitation. No evidence of mitral stenosis.   4. The aortic valve is tricuspid. There is moderate calcification of the  aortic valve. Aortic valve regurgitation is trivial. Aortic valve  sclerosis/calcification is present, without any  evidence of aortic  stenosis.   5. The inferior vena cava is normal in size with greater than 50%  respiratory variability, suggesting right atrial pressure of 3 mmHg.   CTA Chest  11/26/2021: IMPRESSION: 1. Negative for pulmonary embolism or other acute intrathoracic findings. 2. Small hiatal hernia. 3. Benign 2.0 cm left adrenal gland nodule, stable from 2018 and likely representing an adenoma. 4. Aortic atherosclerosis (ICD10-I70.0).   EKG:  EKG is personally reviewed.   09/29/2022:  EKG was not ordered. 03/30/2022:  EKG was not ordered. 12/22/2021:  SR with 1st degree AV block at 78 bpm  Recent Labs: 11/26/2021: B Natriuretic Peptide 7.3 07/16/2022: ALT 12; BUN 14; Creat 0.88; Hemoglobin 15.0; Platelets 305; Potassium 4.1; Sodium 139; TSH 3.34   Recent Lipid Panel    Component Value Date/Time   CHOL 150 01/02/2022 1017   TRIG 110 01/02/2022 1017   HDL 53 01/02/2022 1017   CHOLHDL 2.8 01/02/2022 1017   VLDL 29 09/12/2017 0026   LDLCALC 77 01/02/2022 1017    Physical Exam:    VS:  BP 127/72 (BP Location: Left Arm, Patient Position: Sitting, Cuff Size: Normal)   Pulse 76  Ht 5\' 2"  (1.575 m)   Wt 150 lb 8 oz (68.3 kg)   SpO2 94%   BMI 27.53 kg/m     Wt Readings from Last 3 Encounters:  09/29/22 150 lb 8 oz (68.3 kg)  07/16/22 153 lb 6.4 oz (69.6 kg)  03/30/22 153 lb (69.4 kg)    GEN: Well nourished, well developed in no acute distress HEENT: Normal, moist mucous membranes NECK: No JVD CARDIAC: regular rhythm, normal S1 and S2, no rubs or gallops. No murmur appreciated today. VASCULAR: Radial and DP pulses 2+ bilaterally. No carotid bruits RESPIRATORY:  Clear to auscultation without rales, wheezing or rhonchi  ABDOMEN: Soft, non-tender, non-distended MUSCULOSKELETAL:  Ambulates independently SKIN: Warm and dry, no significant LE edema NEUROLOGIC:  Alert and oriented x 3. No focal neuro deficits noted. PSYCHIATRIC:  Normal affect    ASSESSMENT:    1. Coronary  artery calcification seen on CT scan   2. Family history of heart disease   3. Mixed hyperlipidemia   4. Cardiac risk counseling     PLAN:    Atypical chest pain -resolved  Mixed hyperlipidemia Coronary calcification on CT Family history of heart disease -continue rosuvastatin -above age recommendations for aspirin  Cardiac risk counseling and prevention recommendations: -recommend heart healthy/Mediterranean diet, with whole grains, fruits, vegetable, fish, lean meats, nuts, and olive oil. Limit salt. -recommend moderate walking, 3-5 times/week for 30-50 minutes each session. Aim for at least 150 minutes.week. Goal should be pace of 3 miles/hours, or walking 1.5 miles in 30 minutes -recommend avoidance of tobacco products. Avoid excess alcohol. -ASCVD risk score: The ASCVD Risk score (Arnett DK, et al., 2019) failed to calculate for the following reasons:   The 2019 ASCVD risk score is only valid for ages 64 to 31    Plan for follow up: 1 year or sooner as needed.  Jodelle Red, MD, PhD, [facility] [place]  [facility]    Patient Instructions  Medication Instructions:  Your physician recommends that you continue on your current medications as directed. Please refer to the Current Medication list given to you today.  *If you need a refill on your cardiac medications before your next appointment, please call your pharmacy*   Follow-Up: At [facility], you and your health needs are our priority.  As part of our continuing mission to provide you with exceptional heart care, we have created designated Provider Care Teams.  These Care Teams include your primary Cardiologist (physician) and Advanced Practice Providers (APPs -  Physician Assistants and Nurse Practitioners) who all work together to provide you with the care you need, when you need it.  We recommend signing up for the patient portal called "MyChart".  Sign up information is provided on this After  Visit Summary.  MyChart is used to connect with patients for Virtual Visits (Telemedicine).  Patients are able to view lab/test results, encounter notes, upcoming appointments, etc.  Non-urgent messages can be sent to your provider as well.   To learn more about what you can do with MyChart, go to [email or web address].    Your next appointment:   1 year   Provider:   Jodelle Red, MD       [facility],acting as a scribe for Jodelle Red, MD.,have documented all relevant documentation on the behalf of Jodelle Red, MD,as directed by  Jodelle Red, MD while in the presence of Jodelle Red, MD.  I, Jodelle Red, MD, have reviewed all documentation for this visit. The documentation on  09/29/22 for the exam, diagnosis, procedures, and orders are all accurate and complete.   Signed, Jodelle Red, MD PhD 09/29/2022     [facility]

## 2022-09-29 NOTE — Patient Instructions (Signed)
Medication Instructions:  Your physician recommends that you continue on your current medications as directed. Please refer to the Current Medication list given to you today.  *If you need a refill on your cardiac medications before your next appointment, please call your pharmacy*   Follow-Up: At [facility], you and your health needs are our priority.  As part of our continuing mission to provide you with exceptional heart care, we have created designated Provider Care Teams.  These Care Teams include your primary Cardiologist (physician) and Advanced Practice Providers (APPs -  Physician Assistants and Nurse Practitioners) who all work together to provide you with the care you need, when you need it.  We recommend signing up for the patient portal called "MyChart".  Sign up information is provided on this After Visit Summary.  MyChart is used to connect with patients for Virtual Visits (Telemedicine).  Patients are able to view lab/test results, encounter notes, upcoming appointments, etc.  Non-urgent messages can be sent to your provider as well.   To learn more about what you can do with MyChart, go to [email or web address].    Your next appointment:   1 year   Provider:   Jodelle Red, MD

## 2022-10-15 ENCOUNTER — Other Ambulatory Visit: Payer: Self-pay

## 2022-10-15 ENCOUNTER — Emergency Department (HOSPITAL_BASED_OUTPATIENT_CLINIC_OR_DEPARTMENT_OTHER): Payer: Medicare PPO | Admitting: Radiology

## 2022-10-15 ENCOUNTER — Emergency Department (HOSPITAL_BASED_OUTPATIENT_CLINIC_OR_DEPARTMENT_OTHER)
Admission: EM | Admit: 2022-10-15 | Discharge: 2022-10-15 | Disposition: A | Discharge status: Home / Self Care | Payer: Medicare PPO | Attending: Emergency Medicine | Admitting: Emergency Medicine

## 2022-10-15 ENCOUNTER — Emergency Department (HOSPITAL_BASED_OUTPATIENT_CLINIC_OR_DEPARTMENT_OTHER): Payer: Medicare PPO

## 2022-10-15 ENCOUNTER — Encounter (HOSPITAL_BASED_OUTPATIENT_CLINIC_OR_DEPARTMENT_OTHER): Payer: Self-pay | Admitting: Emergency Medicine

## 2022-10-15 DIAGNOSIS — Z043 Encounter for examination and observation following other accident: Secondary | ICD-10-CM | POA: Diagnosis not present

## 2022-10-15 DIAGNOSIS — T148XXA Other injury of unspecified body region, initial encounter: Secondary | ICD-10-CM

## 2022-10-15 DIAGNOSIS — W19XXXA Unspecified fall, initial encounter: Secondary | ICD-10-CM

## 2022-10-15 DIAGNOSIS — S0990XA Unspecified injury of head, initial encounter: Secondary | ICD-10-CM | POA: Diagnosis not present

## 2022-10-15 DIAGNOSIS — S0081XA Abrasion of other part of head, initial encounter: Secondary | ICD-10-CM | POA: Insufficient documentation

## 2022-10-15 DIAGNOSIS — W01198A Fall on same level from slipping, tripping and stumbling with subsequent striking against other object, initial encounter: Secondary | ICD-10-CM | POA: Insufficient documentation

## 2022-10-15 DIAGNOSIS — M160 Bilateral primary osteoarthritis of hip: Secondary | ICD-10-CM | POA: Diagnosis not present

## 2022-10-15 DIAGNOSIS — S199XXA Unspecified injury of neck, initial encounter: Secondary | ICD-10-CM | POA: Diagnosis not present
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
## 2022-10-15 NOTE — ED Triage Notes (Signed)
Fall, hit head yesterday. Tender on top of head. Denies n/v vision changes

## 2022-10-15 NOTE — ED Notes (Signed)
Reviewed AVS/discharge instruction with patient. Time allotted for and all questions answered. Patient is agreeable for d/c and escorted to ed exit by staff.  

## 2022-10-15 NOTE — Discharge Instructions (Signed)
It was a pleasure taking care of you here in the emergency department today  Your workup today was reassuring.  I recommend Tylenol (Acetaminophen) ibuprofen (Aleve, naproxen, Advil ) as needed for pain  Ice to the areas where you have pain  Follow-up with primary care provider  Return for new or worsening symptoms

## 2022-10-20 ENCOUNTER — Telehealth: Payer: Self-pay

## 2022-10-20 NOTE — Telephone Encounter (Signed)
Transition Care Management Unsuccessful Follow-up Telephone Call  Date of discharge and from where:  [facility] 6/13  Attempts:  2nd Attempt  Reason for unsuccessful TCM follow-up call:  Left voice message   Lenard Forth [facility] Guide, [facility] [phone] [address] [place], [place], [place] [address] Phone: [phone] Email: [email or web address]

## 2022-10-20 NOTE — Telephone Encounter (Signed)
Transition Care Management Unsuccessful Follow-up Telephone Call  Date of discharge and from where:  [facility] 6/13  Attempts:  1st Attempt  Reason for unsuccessful TCM follow-up call:  Left voice message   Harve Spradley [facility], [place] [phone] [address], [place], [place] [address] Phone: [phone] Email: [email or web address]       

## 2022-11-05 ENCOUNTER — Encounter (HOSPITAL_BASED_OUTPATIENT_CLINIC_OR_DEPARTMENT_OTHER): Payer: Self-pay | Admitting: Cardiology

## 2022-11-19 ENCOUNTER — Ambulatory Visit: Payer: Medicare PPO | Admitting: Orthopedic Surgery

## 2022-11-19 ENCOUNTER — Encounter: Payer: Self-pay | Admitting: Orthopedic Surgery

## 2022-11-19 VITALS — BP 124/72 | HR 84 | Temp 96.5°F | Resp 16 | Ht 62.0 in | Wt 149.2 lb

## 2022-11-19 DIAGNOSIS — E119 Type 2 diabetes mellitus without complications: Secondary | ICD-10-CM | POA: Diagnosis not present

## 2022-11-19 DIAGNOSIS — K219 Gastro-esophageal reflux disease without esophagitis: Secondary | ICD-10-CM

## 2022-11-19 DIAGNOSIS — E785 Hyperlipidemia, unspecified: Secondary | ICD-10-CM | POA: Diagnosis not present

## 2022-11-19 DIAGNOSIS — E1169 Type 2 diabetes mellitus with other specified complication: Secondary | ICD-10-CM | POA: Diagnosis not present

## 2022-11-19 DIAGNOSIS — R252 Cramp and spasm: Secondary | ICD-10-CM | POA: Diagnosis not present

## 2022-11-19 DIAGNOSIS — I10 Essential (primary) hypertension: Secondary | ICD-10-CM | POA: Diagnosis not present

## 2022-11-19 NOTE — Patient Instructions (Addendum)
Please get flu vaccine and covid BEFORE November 1st  Try yellow mustard at night for muscle cramps  You can also supplement magnesium glycinate

## 2022-11-19 NOTE — Progress Notes (Unsigned)
Careteam: Patient Care Team: Sara Heir, NP as PCP - General (Adult Health Nurse Practitioner) Jodelle Red, MD as PCP - Cardiology (Cardiology) [name], MD as Consulting Physician (Ophthalmology)  Seen by: [name], AGNP-C  PLACE OF SERVICE:  [facility]  Advanced Directive information Does Patient Have a Medical Advance Directive?: Yes, Type of Advance Directive: Healthcare Power of [place];Living will, Does patient want to make changes to medical advance directive?: No - Patient declined  Allergies  Allergen Reactions   Statins Other (See Comments)    Myalgias with crestor even when taken only a few times per week   Dairycare [Lactase-Lactobacillus]     Stomach upset    Erythromycin Itching    SEVERE ITCHING   Other Other (See Comments)    NO "ACIDIC" FOODS!! They cause GI upset   Pollen Extract-Tree Extract [Pollen Extract]    Tomato Other (See Comments)    Causes GI upset   Zyrtec [Cetirizine Hcl]     Increased sleepiness    Chief Complaint  Patient presents with   Medical Management of Chronic Issues    4 month follow up.      HPI: Patient is a 89 y.o. female seen today for medical management of chronic conditions.   No hypoglycemias. Taking metformin as prescribed.   Bilateral leg pain> sharp pain that is occurring more at night. Does not always involve both legs at same time. Described as muscle cramping. No interested in prescribed medication.   No recent falls or injuries.   Weights stable.   Review of Systems:  Review of Systems  Constitutional: Negative.  Negative for weight loss.  HENT: Negative.    Eyes: Negative.   Respiratory: Negative.    Cardiovascular: Negative.   Gastrointestinal:  Positive for heartburn.  Genitourinary: Negative.   Musculoskeletal:  Positive for myalgias.  Skin: Negative.   Neurological: Negative.   Endo/Heme/Allergies: Negative.   Psychiatric/Behavioral:  Negative for depression. The patient is  not nervous/anxious.     Past Medical History:  Diagnosis Date   Allergic rhinitis    Atypical chest pain 09/10/2015   Bronchitis, acute    Dyspnea on exertion    Exudative pleural effusion 5/09    sterile, benign, inflammatory   GERD (gastroesophageal reflux disease) 09/10/2015   Systolic murmur 09/10/2015   Past Surgical History:  Procedure Laterality Date   APPENDECTOMY     CATARACT EXTRACTION W/ INTRAOCULAR LENS IMPLANT Bilateral 12/2018   OOPHORECTOMY Right    thoracentesis left  5/09   Social History:   reports that she has never smoked. She has never used smokeless tobacco. She reports that she does not drink alcohol and does not use drugs.  Family History  Problem Relation Age of Onset   Allergies Mother    Heart disease Mother 28   Cancer Mother    Dementia Mother    Heart disease Father 69   Thyroid cancer Sister    Dementia Sister 81    Medications: Patient's Medications  New Prescriptions   No medications on file  Previous Medications   ACCU-CHEK SOFTCLIX LANCETS LANCETS    USE UP TO 4 TIMES DAILY AS DIRECTED   AMLODIPINE (NORVASC) 2.5 MG TABLET    TAKE 1 TABLET BY MOUTH EVERY DAY   BLOOD GLUCOSE METER KIT AND SUPPLIES    Dispense based on patient and insurance preference. Use up to four times daily as directed. (FOR ICD-10 E10.9, E11.9).   CALCIUM CARB-CHOLECALCIFEROL (CALCIUM + D3  PO)    Take 1 tablet by mouth daily.   COENZYME Q10 100 MG TABS    Take by mouth.   EZETIMIBE (ZETIA) 10 MG TABLET    TAKE 1 TABLET BY MOUTH EVERY OTHER DAY   FLUTICASONE (FLONASE) 50 MCG/ACT NASAL SPRAY    Place 1 spray into both nostrils as needed for allergies or rhinitis.   METFORMIN (GLUCOPHAGE) 500 MG TABLET    Take 1 tablet (500 mg total) by mouth daily with breakfast.   MULTIPLE VITAMINS-MINERALS (MULTIVITAMIN WOMEN 50+) TABS    Take 1 tablet by mouth daily.   PROBIOTIC PRODUCT (UP4 PROBIOTICS PO)    Take 1 tablet by mouth daily.   PROPYLENE GLYCOL (SYSTANE BALANCE OP)     Apply 1 drop to eye daily as needed (dry eyes).   ROSUVASTATIN (CRESTOR) 5 MG TABLET    TAKE 2 TABLETS (10 MG TOTAL) BY MOUTH EVERY OTHER DAY.  Modified Medications   No medications on file  Discontinued Medications   No medications on file    Physical Exam:  Vitals:   11/19/22 1455  BP: 124/72  Pulse: 84  Resp: 16  Temp: (!) 96.5 F (35.8 C)  SpO2: 93%  Weight: 149 lb 3.2 oz (67.7 kg)  Height: 5\' 2"  (1.575 m)   Body mass index is 27.29 kg/m. Wt Readings from Last 3 Encounters:  11/19/22 149 lb 3.2 oz (67.7 kg)  09/29/22 150 lb 8 oz (68.3 kg)  07/16/22 153 lb 6.4 oz (69.6 kg)    Physical Exam Vitals reviewed.  Constitutional:      General: She is not in acute distress. HENT:     Head: Normocephalic.  Eyes:     General:        Right eye: No discharge.        Left eye: No discharge.  Cardiovascular:     Rate and Rhythm: Normal rate and regular rhythm.     Pulses: Normal pulses.     Heart sounds: Normal heart sounds.  Pulmonary:     Effort: Pulmonary effort is normal. No respiratory distress.     Breath sounds: Normal breath sounds. No wheezing.  Abdominal:     General: Bowel sounds are normal. There is no distension.     Palpations: Abdomen is soft.     Tenderness: There is no abdominal tenderness.  Musculoskeletal:     Cervical back: Neck supple.     Right lower leg: No edema.     Left lower leg: No edema.  Skin:    General: Skin is warm.  Neurological:     General: No focal deficit present.     Mental Status: She is alert and oriented to person, place, and time.     Sensory: No sensory deficit.     Motor: No weakness.     Coordination: Coordination normal.     Gait: Gait normal.  Psychiatric:        Mood and Affect: Mood normal.        Behavior: Behavior normal.     Labs reviewed: Basic Metabolic Panel: Recent Labs    11/26/21 1050 01/02/22 1017 07/16/22 1500  NA 138 140 139  K 3.8 4.2 4.1  CL 108 106 104  CO2 24 24 24   GLUCOSE 104* 100  151*  BUN 17 19 14   CREATININE 0.89 0.79 0.88  CALCIUM 8.9 9.3 9.5  TSH  --   --  3.34   Liver Function Tests: Recent Labs  11/26/21 1050 01/02/22 1017 07/16/22 1500  AST 18 16 16   ALT 14 14 12   ALKPHOS 56  --   --   BILITOT 0.5 0.5 0.5  PROT 5.6* 6.3 6.5  ALBUMIN 3.4*  --   --    No results for input(s): "LIPASE", "AMYLASE" in the last 8760 hours. No results for input(s): "AMMONIA" in the last 8760 hours. CBC: Recent Labs    11/26/21 0828 11/27/21 0209 01/02/22 1017 07/16/22 1500  WBC 6.8  --  6.6 7.1  NEUTROABS  --   --  3,808 4,679  HGB 15.6* 13.8 15.4 15.0  HCT 46.4* 39.9 45.4* 45.7*  MCV 86.6  --  87.5 87.9  PLT 244  --  258 305   Lipid Panel: Recent Labs    01/02/22 1017  CHOL 150  HDL 53  LDLCALC 77  TRIG 110  CHOLHDL 2.8   TSH: Recent Labs    07/16/22 1500  TSH 3.34   A1C: Lab Results  Component Value Date   HGBA1C 6.8 (H) 07/09/2022     Assessment/Plan 1. Controlled type 2 diabetes mellitus without complication, without long-term current use of insulin (HCC) - no hypoglycemias - cont metformin - Complete Metabolic Panel with eGFR - Hemoglobin A1c> A1c 6.4 (07/18)  2. Essential hypertension - controlled - BUN/creat 15/0.94 (07/18) - cont amlodipine  3. Hyperlipidemia associated with type 2 diabetes mellitus (HCC) - total 150, LDL 77> goal < 70 - cont Zetia and rosuvastatin - lipid panel> future  4. Muscle cramps - involves lower extremities - exam unremarkable - ? diabetic neuropathy - not interested in prescribed medication - discussed yellow mustard and magnesium glycinate supplement  - consider magnesium, iron studies if symptoms continue   Total time: 31 minutes. Greater than 50% of total time spent doing patient education regarding health maintenance, T2DM, HTN, HLD and muscle cramps including symptom/medication management.   Next appt: 11/26/2022  [name], Juel Burrow  [facility] [phone]

## 2022-11-20 LAB — COMPLETE METABOLIC PANEL WITH GFR
AG Ratio: 2 (calc) (ref 1.0–2.5)
ALT: 17 U/L (ref 6–29)
AST: 19 U/L (ref 10–35)
Albumin: 4.4 g/dL (ref 3.6–5.1)
Alkaline phosphatase (APISO): 70 U/L (ref 37–153)
BUN: 15 mg/dL (ref 7–25)
CO2: 28 mmol/L (ref 20–32)
Calcium: 9.7 mg/dL (ref 8.6–10.4)
Chloride: 105 mmol/L (ref 98–110)
Creat: 0.94 mg/dL (ref 0.60–0.95)
Globulin: 2.2 g/dL (calc) (ref 1.9–3.7)
Glucose, Bld: 98 mg/dL (ref 65–99)
Potassium: 4.2 mmol/L (ref 3.5–5.3)
Sodium: 140 mmol/L (ref 135–146)
Total Bilirubin: 0.5 mg/dL (ref 0.2–1.2)
Total Protein: 6.6 g/dL (ref 6.1–8.1)
eGFR: 59 mL/min/{1.73_m2} — ABNORMAL LOW (ref >=60)

## 2022-11-20 LAB — HEMOGLOBIN A1C
Hgb A1c MFr Bld: 6.4 % of total Hgb — ABNORMAL HIGH (ref <5.7)
Mean Plasma Glucose: 137 mg/dL
eAG (mmol/L): 7.6 mmol/L

## 2022-11-24 ENCOUNTER — Other Ambulatory Visit: Payer: Self-pay | Admitting: Orthopedic Surgery

## 2022-11-24 DIAGNOSIS — E1140 Type 2 diabetes mellitus with diabetic neuropathy, unspecified: Secondary | ICD-10-CM

## 2022-11-26 ENCOUNTER — Ambulatory Visit (INDEPENDENT_AMBULATORY_CARE_PROVIDER_SITE_OTHER): Payer: Medicare PPO | Admitting: Orthopedic Surgery

## 2022-11-26 ENCOUNTER — Encounter: Payer: Self-pay | Admitting: Orthopedic Surgery

## 2022-11-26 DIAGNOSIS — Z1231 Encounter for screening mammogram for malignant neoplasm of breast: Secondary | ICD-10-CM

## 2022-11-26 DIAGNOSIS — Z0000 Encounter for general adult medical examination without abnormal findings: Secondary | ICD-10-CM | POA: Diagnosis not present

## 2022-11-26 DIAGNOSIS — Z780 Asymptomatic menopausal state: Secondary | ICD-10-CM

## 2022-11-26 NOTE — Progress Notes (Signed)
Subjective:   Sara Charles is a 89 y.o. female who presents for Medicare Annual (Subsequent) preventive examination.  Visit Complete: Virtual  I connected with  Rashiya Lofland on 11/26/22 by a video and audio enabled telemedicine application and verified that I am speaking with the correct person using two identifiers.  Patient Location: Home  Provider Location: Office/Clinic  I discussed the limitations of evaluation and management by telemedicine. The patient expressed understanding and agreed to proceed.  Patient Medicare AWV questionnaire was completed by the patient on 11/26/2022; I have confirmed that all information answered by patient is correct and no changes since this date.  Review of Systems     Cardiac Risk Factors include: advanced age (>64men, >10 women);sedentary lifestyle;hypertension;diabetes mellitus     Objective:    There were no vitals filed for this visit. There is no height or weight on file to calculate BMI.     11/26/2022    2:25 PM 11/19/2022    2:57 PM 10/15/2022    5:22 PM 07/16/2022    2:26 PM 01/21/2022    2:09 PM 11/26/2021    3:00 PM 11/20/2021    9:45 AM  Advanced Directives  Does Patient Have a Medical Advance Directive? Yes Yes No Yes Yes Yes Yes  Type of Estate agent of [place];Living will Healthcare Power of [place];Living will  Healthcare Power of [place];Living will Healthcare Power of [place];Living will Living will;Healthcare Power of [employer] Power of [place];Living will  Does patient want to make changes to medical advance directive? No - Patient declined No - Patient declined  No - Patient declined No - Patient declined No - Patient declined No - Patient declined  Copy of Healthcare Power of Attorney in Chart? Yes - validated most recent copy scanned in chart (See row information) Yes - validated most recent copy scanned in chart (See row information)  Yes - validated most recent copy  scanned in chart (See row information) Yes - validated most recent copy scanned in chart (See row information) No - copy requested Yes - validated most recent copy scanned in chart (See row information)  Would patient like information on creating a medical advance directive?   No - Patient declined        Current Medications (verified) Outpatient Encounter Medications as of 11/26/2022  Medication Sig   Accu-Chek Softclix Lancets lancets USE UP TO 4 TIMES DAILY AS DIRECTED   amLODipine (NORVASC) 2.5 MG tablet TAKE 1 TABLET BY MOUTH EVERY DAY   blood glucose meter kit and supplies Dispense based on patient and insurance preference. Use up to four times daily as directed. (FOR ICD-10 E10.9, E11.9).   Calcium Carb-Cholecalciferol (CALCIUM + D3 PO) Take 1 tablet by mouth daily.   Coenzyme Q10 100 MG TABS Take by mouth.   ezetimibe (ZETIA) 10 MG tablet TAKE 1 TABLET BY MOUTH EVERY OTHER DAY   fluticasone (FLONASE) 50 MCG/ACT nasal spray Place 1 spray into both nostrils as needed for allergies or rhinitis.   metFORMIN (GLUCOPHAGE) 500 MG tablet TAKE 1 TABLET BY MOUTH EVERY DAY WITH BREAKFAST   Multiple Vitamins-Minerals (MULTIVITAMIN WOMEN 50+) TABS Take 1 tablet by mouth daily.   Probiotic Product (UP4 PROBIOTICS PO) Take 1 tablet by mouth daily.   Propylene Glycol (SYSTANE BALANCE OP) Apply 1 drop to eye daily as needed (dry eyes).   rosuvastatin (CRESTOR) 5 MG tablet TAKE 2 TABLETS (10 MG TOTAL) BY MOUTH EVERY OTHER DAY.   No facility-administered  encounter medications on file as of 11/26/2022.    Allergies (verified) Statins, Dairycare [lactase-lactobacillus], Erythromycin, Other, Pollen extract-tree extract [pollen extract], Tomato, and Zyrtec [cetirizine hcl]   History: Past Medical History:  Diagnosis Date   Allergic rhinitis    Atypical chest pain 09/10/2015   Bronchitis, acute    Dyspnea on exertion    Exudative pleural effusion 5/09    sterile, benign, inflammatory   GERD  (gastroesophageal reflux disease) 09/10/2015   Systolic murmur 09/10/2015   Past Surgical History:  Procedure Laterality Date   APPENDECTOMY     CATARACT EXTRACTION W/ INTRAOCULAR LENS IMPLANT Bilateral 12/2018   OOPHORECTOMY Right    thoracentesis left  5/09   Family History  Problem Relation Age of Onset   Allergies Mother    Heart disease Mother 59   Cancer Mother    Dementia Mother    Heart disease Father 59   Thyroid cancer Sister    Dementia Sister 20   Social History   Socioeconomic History   Marital status: Single    Spouse name: Not on file   Number of children: Not on file   Years of education: Not on file   Highest education level: Not on file  Occupational History   Occupation: retired    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       Tobacco use, amount per day now: N/A   Past tobacco use, amount per day: N/A   How many years did you use tobacco: N/A   Alcohol use (drinks per week): N/A   Diet:   Do you drink/eat things with caffeine: CHOCOLATE   Marital status:   SINGLE                               What year were you married?   Do you live in a house, apartment, assisted living, condo, trailer, etc.? HOUSE   Is it one or more stories? ONE   How many persons live in your home? 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            NO                      Type and how often?   Do you have a living will? YES   Do you have a DNR form?                                   If not, do you want to discuss one?    Do you have signed POA/HPOA forms?     YES                   If so, please bring to you appointment   Social Determinants of Health  Financial Resource Strain: Low Risk  (11/26/2022)   Overall Financial Resource Strain (CARDIA)    Difficulty of Paying Living Expenses: Not hard at all  Food Insecurity: No Food Insecurity (11/26/2022)   Hunger Vital Sign    Worried About Running Out of Food in the Last Year: Never true    Ran Out of Food in the Last Year: Never true  Transportation Needs: No Transportation Needs (11/26/2022)   PRAPARE - Administrator, Civil Service (Medical): No    Lack of Transportation (Non-Medical): No  Physical Activity: Inactive (11/26/2022)   Exercise Vital Sign    Days of Exercise per Week: 0 days    Minutes of Exercise per Session: 0 min  Stress: No Stress Concern Present (11/26/2022)   [employer] of Occupational Health - Occupational Stress Questionnaire    Feeling of Stress : Only a little  Social Connections: Moderately Isolated (11/26/2022)   Social Connection and Isolation Panel [NHANES]    Frequency of Communication with Friends and Family: More than three times a week    Frequency of Social Gatherings with Friends and Family: Twice a week    Attends Religious Services: 1 to 4 times per year    Active Member of [employer] or Organizations: No    Attends Engineer, structural: Never    Marital Status: Never married    Tobacco Counseling Counseling given: Not Answered   Clinical Intake:  Pre-visit preparation completed: No  Pain : No/denies pain     BMI - recorded: 27 Nutritional Status: BMI 25 -29 Overweight Nutritional Risks: None Diabetes: Yes CBG done?: Yes CBG resulted in Enter/ Edit results?: Yes Did pt. bring in CBG monitor from home?: No  How often do you need to have someone help you when you read instructions, pamphlets, or other written materials from your doctor or pharmacy?: 2 - Rarely What is the last  grade level you completed in school?: Masters         Activities of Daily Living    11/26/2022    2:31 PM  In your present state of health, do you have any difficulty performing the following activities:  Hearing? 0  Vision? 0  Difficulty concentrating or making decisions? 0  Walking or climbing stairs? 0  Dressing or bathing? 0  Doing errands, shopping? 0  Preparing Food and eating ? Y  Using the Toilet? N  In the past six months, have you accidently leaked urine? Y  Do you have problems with loss of bowel control? N  Managing your Medications? N  Managing your Finances? N  Housekeeping or managing your Housekeeping? Y    Patient Care Team: Octavia Heir, NP as PCP - General (Adult Health Nurse Practitioner) Jodelle Red, MD as PCP - Cardiology (Cardiology) [name], MD as Consulting Physician (Ophthalmology)  Indicate any recent Medical Services you may have received from other than [facility] providers in the past year (date may be approximate).     Assessment:   This is a routine wellness examination for Sara Charles.  Hearing/Vision screen Hearing Screening - Comments:: No hearing concerns.  Vision Screening - Comments:: No vision concerns. Patient last eye exam 2024. Patient wears reading glasses.   Dietary issues and exercise activities discussed:     Goals Addressed             This Visit's Progress    Maintain Mobility and Function   On track    Evidence-based guidance:  Emphasize the importance  of physical activity and aerobic exercise as included in treatment plan; assess barriers to adherence; consider patient's abilities and preferences.  Encourage gradual increase in activity or exercise instead of stopping if pain occurs.  Reinforce individual therapy exercise prescription, such as strengthening, stabilization and stretching programs.  Promote optimal body mechanics to stabilize the spine with lifting and functional activity.  Encourage  activity and mobility modifications to facilitate optimal function, such as using a log roll for bed mobility or dressing from a seated position.  Reinforce individual adaptive equipment recommendations to limit excessive spinal movements, such as a Event organiser.  Assess adequacy of sleep; encourage use of sleep hygiene techniques, such as bedtime routine; use of white noise; dark, cool bedroom; avoiding daytime naps, heavy meals or exercise before bedtime.  Promote positions and modification to optimize sleep and sexual activity; consider pillows or positioning devices to assist in maintaining neutral spine.  Explore options for applying ergonomic principles at work and home, such as frequent position changes, using ergonomically designed equipment and working at optimal height.  Promote modifications to increase comfort with driving such as lumbar support, optimizing seat and steering wheel position, using cruise control and taking frequent rest stops to stretch and walk.   Notes:      Patient Stated   On track    Maintain current level of health        Depression Screen    11/26/2022    2:22 PM 11/20/2021    3:23 PM 11/13/2020    2:12 PM 06/27/2020    2:59 PM 12/25/2019    3:09 PM 11/09/2019   11:07 AM 10/20/2018    2:04 PM  PHQ 2/9 Scores  PHQ - 2 Score 0 0 0 0 0 0 0    Fall Risk    11/26/2022    2:22 PM 11/19/2022    2:56 PM 07/16/2022    2:25 PM 01/21/2022    2:08 PM 12/11/2021    2:43 PM  Fall Risk   Falls in the past year? 0 0 0 0 0  Number falls in past yr: 0 0 0 0 0  Injury with Fall? 0 0 0 0 0  Risk for fall due to : No Fall Risks No Fall Risks No Fall Risks No Fall Risks No Fall Risks  Follow up Falls evaluation completed;Education provided;Falls prevention discussed Falls evaluation completed;Education provided;Falls prevention discussed Falls evaluation completed Falls evaluation completed     MEDICARE RISK AT HOME:  Medicare Risk at Home - 11/26/22 1435     Any  stairs in or around the home? Yes    If so, are there any without handrails? Yes    Home free of loose throw rugs in walkways, pet beds, electrical cords, etc? Yes    Adequate lighting in your home to reduce risk of falls? Yes    Life alert? No    Use of a cane, walker or w/c? Yes    Grab bars in the bathroom? Yes    Shower chair or bench in shower? No    Elevated toilet seat or a handicapped toilet? Yes             TIMED UP AND GO:  Was the test performed?  No    Cognitive Function:        11/26/2022    2:22 PM 11/20/2021    3:23 PM 11/13/2020    2:15 PM 11/09/2019   11:12 AM  6CIT Screen  What Year?  0 points 0 points 0 points 0 points  What month? 0 points 0 points 0 points 0 points  What time? 0 points 0 points 0 points 0 points  Count back from 20 0 points 0 points 0 points 0 points  Months in reverse 0 points 0 points 0 points 0 points  Repeat phrase 4 points 2 points 2 points 2 points  Total Score 4 points 2 points 2 points 2 points    Immunizations Immunization History  Administered Date(s) Administered   Covid-19, Mrna,Vaccine(Spikevax)37yrs and older 05/11/2022   DTaP 11/16/2019   Fluad Quad(high Dose 65+) 02/23/2019, 01/02/2021, 03/31/2022   Influenza Split 02/02/2011, 04/03/2012   Influenza, High Dose Seasonal PF 03/12/2017, 04/11/2018, 03/04/2020   PFIZER(Purple Top)SARS-COV-2 Vaccination 06/26/2019, 07/28/2019, 02/09/2020, 10/14/2020   Pneumococcal Conjugate-13 04/30/2015   Pneumococcal Polysaccharide-23 03/09/2019   Respiratory Syncytial Virus Vaccine,Recomb Aduvanted(Arexvy) 07/03/2022   Tdap 11/16/2019   Zoster Recombinant(Shingrix) 01/24/2020, 03/25/2020    TDAP status: Up to date  Flu Vaccine status: Up to date  Pneumococcal vaccine status: Up to date  Covid-19 vaccine status: Completed vaccines  Qualifies for Shingles Vaccine? Yes   Zostavax completed No   Shingrix Completed?: Yes  Screening Tests Health Maintenance  Topic Date Due    COVID-19 Vaccine (6 - 2023-24 season) 09/09/2022   INFLUENZA VACCINE  12/03/2022   HEMOGLOBIN A1C  05/22/2023   FOOT EXAM  07/16/2023   OPHTHALMOLOGY EXAM  08/05/2023   Medicare Annual Wellness (AWV)  11/26/2023   DTaP/Tdap/Td (3 - Td or Tdap) 11/15/2029   Pneumonia Vaccine 34+ Years old  Completed   DEXA SCAN  Completed   Zoster Vaccines- Shingrix  Completed   HPV VACCINES  Aged Out    Health Maintenance  Health Maintenance Due  Topic Date Due   COVID-19 Vaccine (6 - 2023-24 season) 09/09/2022    Colorectal cancer screening: No longer required.   Mammogram status: Completed 2017. Repeat every year  Bone Density status: Completed 02/2020. Results reflect: Bone density results: NORMAL. Repeat every 2 years.  Lung Cancer Screening: (Low Dose CT Chest recommended if Age 70-80 years, 20 pack-year currently smoking OR have quit w/in 15years.) does not qualify.   Lung Cancer Screening Referral: No  Additional Screening:  Hepatitis C Screening: does not qualify; Completed   Vision Screening: Recommended annual ophthalmology exams for early detection of glaucoma and other disorders of the eye. Is the patient up to date with their annual eye exam?  Yes  Who is the provider or what is the name of the office in which the patient attends annual eye exams? Dr. Hazle Quant If pt is not established with a provider, would they like to be referred to a provider to establish care? No .   Dental Screening: Recommended annual dental exams for proper oral hygiene  Diabetic Foot Exam: Diabetic Foot Exam: Completed 11/19/2022  Community Resource Referral / Chronic Care Management: CRR required this visit?  No   CCM required this visit?  No     Plan:     I have personally reviewed and noted the following in the patient's chart:   Medical and social history Use of alcohol, tobacco or illicit drugs  Current medications and supplements including opioid prescriptions. Patient is not currently  taking opioid prescriptions. Functional ability and status Nutritional status Physical activity Advanced directives List of other physicians Hospitalizations, surgeries, and ER visits in previous 12 months Vitals Screenings to include cognitive, depression, and falls Referrals and appointments  In addition, I  have reviewed and discussed with patient certain preventive protocols, quality metrics, and best practice recommendations. A written personalized care plan for preventive services as well as general preventive health recommendations were provided to patient.     Octavia Heir, NP   11/26/2022   After Visit Summary: (MyChart) Due to this being a telephonic visit, the after visit summary with patients personalized plan was offered to patient via MyChart   Nurse Notes: will order mammogram and bone density with [facility], UTD on vaccinations, recommend getting flu/covid booster BEFORE November 1st

## 2022-11-26 NOTE — Patient Instructions (Signed)
  Sara Charles , Thank you for taking time to come for your Medicare Wellness Visit. I appreciate your ongoing commitment to your health goals. Please review the following plan we discussed and let me know if I can assist you in the future.   These are the goals we discussed:  Goals      Maintain Mobility and Function     Evidence-based guidance:  Emphasize the importance of physical activity and aerobic exercise as included in treatment plan; assess barriers to adherence; consider patient's abilities and preferences.  Encourage gradual increase in activity or exercise instead of stopping if pain occurs.  Reinforce individual therapy exercise prescription, such as strengthening, stabilization and stretching programs.  Promote optimal body mechanics to stabilize the spine with lifting and functional activity.  Encourage activity and mobility modifications to facilitate optimal function, such as using a log roll for bed mobility or dressing from a seated position.  Reinforce individual adaptive equipment recommendations to limit excessive spinal movements, such as a Event organiser.  Assess adequacy of sleep; encourage use of sleep hygiene techniques, such as bedtime routine; use of white noise; dark, cool bedroom; avoiding daytime naps, heavy meals or exercise before bedtime.  Promote positions and modification to optimize sleep and sexual activity; consider pillows or positioning devices to assist in maintaining neutral spine.  Explore options for applying ergonomic principles at work and home, such as frequent position changes, using ergonomically designed equipment and working at optimal height.  Promote modifications to increase comfort with driving such as lumbar support, optimizing seat and steering wheel position, using cruise control and taking frequent rest stops to stretch and walk.   Notes:      Patient Stated     Maintain current level of health         This is a list of the  screening recommended for you and due dates:  Health Maintenance  Topic Date Due   COVID-19 Vaccine (6 - 2023-24 season) 09/09/2022   Flu Shot  12/03/2022   Hemoglobin A1C  05/22/2023   Complete foot exam   07/16/2023   Eye exam for diabetics  08/05/2023   Medicare Annual Wellness Visit  11/26/2023   DTaP/Tdap/Td vaccine (3 - Td or Tdap) 11/15/2029   Pneumonia Vaccine  Completed   DEXA scan (bone density measurement)  Completed   Zoster (Shingles) Vaccine  Completed   HPV Vaccine  Aged Out   will order mammogram and bone density with [facility], UTD on vaccinations, recommend getting flu/covid booster BEFORE November 1st  Call [facility] to schedule> [phone]

## 2022-11-26 NOTE — Progress Notes (Signed)
  This service is provided via telemedicine  No vital signs collected/recorded due to the encounter was a telemedicine visit.   Location of patient (ex: home, work):  Home  Patient consents to a telephone visit:  Yes  Location of the provider (ex: office, home):  [facility]  Name of any referring provider:  Fargo, Amy E, NP   Names of all persons participating in the telemedicine service and their role in the encounter:  Patient, Jasmine Dillard, RMA, Amy Fargo, NP.    Time spent on call:  8 minutes spent on the phone with Medical Assistant.    

## 2023-01-05 DIAGNOSIS — E1122 Type 2 diabetes mellitus with diabetic chronic kidney disease: Secondary | ICD-10-CM | POA: Diagnosis not present

## 2023-01-05 DIAGNOSIS — J309 Allergic rhinitis, unspecified: Secondary | ICD-10-CM | POA: Diagnosis not present

## 2023-01-05 DIAGNOSIS — E1151 Type 2 diabetes mellitus with diabetic peripheral angiopathy without gangrene: Secondary | ICD-10-CM | POA: Diagnosis not present

## 2023-01-05 DIAGNOSIS — Z8249 Family history of ischemic heart disease and other diseases of the circulatory system: Secondary | ICD-10-CM | POA: Diagnosis not present

## 2023-01-05 DIAGNOSIS — E785 Hyperlipidemia, unspecified: Secondary | ICD-10-CM | POA: Diagnosis not present

## 2023-01-05 DIAGNOSIS — M1990 Unspecified osteoarthritis, unspecified site: Secondary | ICD-10-CM | POA: Diagnosis not present

## 2023-01-05 DIAGNOSIS — R32 Unspecified urinary incontinence: Secondary | ICD-10-CM | POA: Diagnosis not present

## 2023-01-05 DIAGNOSIS — N182 Chronic kidney disease, stage 2 (mild): Secondary | ICD-10-CM | POA: Diagnosis not present

## 2023-01-05 DIAGNOSIS — R030 Elevated blood-pressure reading, without diagnosis of hypertension: Secondary | ICD-10-CM | POA: Diagnosis not present

## 2023-01-28 ENCOUNTER — Ambulatory Visit: Payer: Medicare PPO | Admitting: Orthopedic Surgery

## 2023-01-28 ENCOUNTER — Encounter: Payer: Self-pay | Admitting: Orthopedic Surgery

## 2023-01-28 VITALS — BP 130/64 | HR 90 | Temp 96.8°F | Resp 16 | Ht 62.0 in | Wt 151.4 lb

## 2023-01-28 DIAGNOSIS — R600 Localized edema: Secondary | ICD-10-CM | POA: Diagnosis not present

## 2023-01-28 DIAGNOSIS — R6889 Other general symptoms and signs: Secondary | ICD-10-CM | POA: Diagnosis not present

## 2023-01-28 DIAGNOSIS — R252 Cramp and spasm: Secondary | ICD-10-CM

## 2023-01-28 NOTE — Patient Instructions (Addendum)
Try to get cheap pair of compression stockings on [employer]  ABI test ordered at [facility]

## 2023-01-28 NOTE — Progress Notes (Signed)
Careteam: Patient Care Team: Octavia Heir, NP as PCP - General (Adult Health Nurse Practitioner) Jodelle Red, MD as PCP - Cardiology (Cardiology) [name], MD as Consulting Physician (Ophthalmology)  Seen by: [name], AGNP-C  PLACE OF SERVICE:  [facility]  Advanced Directive information Does Patient Have a Medical Advance Directive?: Yes, Type of Advance Directive: Healthcare Power of [place];Living will, Does patient want to make changes to medical advance directive?: No - Patient declined  Allergies  Allergen Reactions   Statins Other (See Comments)    Myalgias with crestor even when taken only a few times per week   Dairycare [Lactase-Lactobacillus]     Stomach upset    Erythromycin Itching    SEVERE ITCHING   Other Other (See Comments)    NO "ACIDIC" FOODS!! They cause GI upset   Pollen Extract-Tree Extract [Pollen Extract]    Tomato Other (See Comments)    Causes GI upset   Zyrtec [Cetirizine Hcl]     Increased sleepiness    Chief Complaint  Patient presents with   Acute Visit    Patient has concerns about PAD ( Peripheral Arterial Disease ).     HPI: Patient is a 89 y.o. female seen today for acute visit due to abnormal ABI.   She was recently seen for insurance home visit and ABI was measured. Results 09/04 revealed left 0.71, right 0.82. She admits to her lower legs feeling tender and swollen at times. Denies intermittent claudication. Her legs will also cramp at times. No recent injury or fall. She is very worried about results today and would like them rechecked. Afebrile. Vitals stable.   Review of Systems:  Review of Systems  Constitutional: Negative.   HENT: Negative.    Eyes: Negative.   Respiratory: Negative.    Cardiovascular:  Positive for leg swelling. Negative for chest pain and claudication.  Musculoskeletal: Negative.   Skin: Negative.   Neurological: Negative.   Psychiatric/Behavioral: Negative.      Past Medical  History:  Diagnosis Date   Allergic rhinitis    Atypical chest pain 09/10/2015   Bronchitis, acute    Dyspnea on exertion    Exudative pleural effusion 5/09    sterile, benign, inflammatory   GERD (gastroesophageal reflux disease) 09/10/2015   Systolic murmur 09/10/2015   Past Surgical History:  Procedure Laterality Date   APPENDECTOMY     CATARACT EXTRACTION W/ INTRAOCULAR LENS IMPLANT Bilateral 12/2018   OOPHORECTOMY Right    thoracentesis left  5/09   Social History:   reports that she has never smoked. She has never used smokeless tobacco. She reports that she does not drink alcohol and does not use drugs.  Family History  Problem Relation Age of Onset   Allergies Mother    Heart disease Mother 59   Cancer Mother    Dementia Mother    Heart disease Father 43   Thyroid cancer Sister    Dementia Sister 61    Medications: Patient's Medications  New Prescriptions   No medications on file  Previous Medications   ACCU-CHEK SOFTCLIX LANCETS LANCETS    USE UP TO 4 TIMES DAILY AS DIRECTED   AMLODIPINE (NORVASC) 2.5 MG TABLET    TAKE 1 TABLET BY MOUTH EVERY DAY   AMOXICILLIN (AMOXIL) 500 MG CAPSULE    Take 500 mg by mouth as directed.   BLOOD GLUCOSE METER KIT AND SUPPLIES    Dispense based on patient and insurance preference. Use up to four  times daily as directed. (FOR ICD-10 E10.9, E11.9).   CALCIUM CARB-CHOLECALCIFEROL (CALCIUM + D3 PO)    Take 1 tablet by mouth daily.   COENZYME Q10 100 MG TABS    Take by mouth.   EZETIMIBE (ZETIA) 10 MG TABLET    TAKE 1 TABLET BY MOUTH EVERY OTHER DAY   FLUTICASONE (FLONASE) 50 MCG/ACT NASAL SPRAY    Place 1 spray into both nostrils as needed for allergies or rhinitis.   METFORMIN (GLUCOPHAGE) 500 MG TABLET    TAKE 1 TABLET BY MOUTH EVERY DAY WITH BREAKFAST   MULTIPLE VITAMINS-MINERALS (MULTIVITAMIN WOMEN 50+) TABS    Take 1 tablet by mouth daily.   PROBIOTIC PRODUCT (UP4 PROBIOTICS PO)    Take 1 tablet by mouth daily.   PROPYLENE GLYCOL  (SYSTANE BALANCE OP)    Apply 1 drop to eye daily as needed (dry eyes).   ROSUVASTATIN (CRESTOR) 5 MG TABLET    TAKE 2 TABLETS (10 MG TOTAL) BY MOUTH EVERY OTHER DAY.  Modified Medications   No medications on file  Discontinued Medications   No medications on file    Physical Exam:  Vitals:   01/28/23 1422  BP: 130/64  Pulse: 90  Resp: 16  Temp: (!) 96.8 F (36 C)  SpO2: 93%  Weight: 151 lb 6.4 oz (68.7 kg)  Height: 5\' 2"  (1.575 m)   Body mass index is 27.69 kg/m. Wt Readings from Last 3 Encounters:  01/28/23 151 lb 6.4 oz (68.7 kg)  11/19/22 149 lb 3.2 oz (67.7 kg)  09/29/22 150 lb 8 oz (68.3 kg)    Physical Exam Vitals reviewed.  Constitutional:      General: She is not in acute distress. HENT:     Head: Normocephalic.  Eyes:     General:        Right eye: No discharge.        Left eye: No discharge.  Cardiovascular:     Rate and Rhythm: Normal rate and regular rhythm.     Pulses: Normal pulses.     Heart sounds: Murmur heard.  Pulmonary:     Effort: Pulmonary effort is normal. No respiratory distress.     Breath sounds: Normal breath sounds. No wheezing or rales.  Musculoskeletal:     Cervical back: Neck supple.     Right lower leg: Edema present.     Left lower leg: Edema present.     Comments: Non pitting edema   Skin:    General: Skin is warm.     Capillary Refill: Capillary refill takes less than 2 seconds.  Neurological:     General: No focal deficit present.     Mental Status: She is alert and oriented to person, place, and time.     Motor: No weakness.     Gait: Gait normal.  Psychiatric:        Mood and Affect: Mood normal.     Labs reviewed: Basic Metabolic Panel: Recent Labs    07/16/22 1500 11/19/22 1522  NA 139 140  K 4.1 4.2  CL 104 105  CO2 24 28  GLUCOSE 151* 98  BUN 14 15  CREATININE 0.88 0.94  CALCIUM 9.5 9.7  TSH 3.34  --    Liver Function Tests: Recent Labs    07/16/22 1500 11/19/22 1522  AST 16 19  ALT 12 17   BILITOT 0.5 0.5  PROT 6.5 6.6   No results for input(s): "LIPASE", "AMYLASE" in the last 8760 hours.  No results for input(s): "AMMONIA" in the last 8760 hours. CBC: Recent Labs    07/16/22 1500  WBC 7.1  NEUTROABS 4,679  HGB 15.0  HCT 45.7*  MCV 87.9  PLT 305   Lipid Panel: No results for input(s): "CHOL", "HDL", "LDLCALC", "TRIG", "CHOLHDL", "LDLDIRECT" in the last 8760 hours. TSH: Recent Labs    07/16/22 1500  TSH 3.34   A1C: Lab Results  Component Value Date   HGBA1C 6.4 (H) 11/19/2022     Assessment/Plan 1. Abnormal ankle brachial index (ABI) - 09/04 left 0.71, right 0.82 - denies intermittent claudication - non pitting edema present - plan to repeat study before considering referral  - US ARTERIAL ABI (SCREENING LOWER EXTREMITY); Future  2. Leg cramping - see above - US ARTERIAL ABI (SCREENING LOWER EXTREMITY); Future  3. Lower leg edema - non pitting edema present - limit salt in diet - elevate legs a few hours daily - try compression stockings during day, remove at bedtime  Total time: 25 minutes. Greater than 50% of total time spent doing patient education regarding abnormal ABI and leg swelling including symptom/medication management.     Next appt: 05/20/2023  [name], Juel Burrow  [facility] [phone]

## 2023-02-19 ENCOUNTER — Other Ambulatory Visit: Payer: Self-pay | Admitting: Orthopedic Surgery

## 2023-02-19 DIAGNOSIS — I10 Essential (primary) hypertension: Secondary | ICD-10-CM

## 2023-03-30 ENCOUNTER — Other Ambulatory Visit: Payer: Self-pay | Admitting: Orthopedic Surgery

## 2023-03-30 DIAGNOSIS — E782 Mixed hyperlipidemia: Secondary | ICD-10-CM

## 2023-03-30 NOTE — Telephone Encounter (Signed)
Patient has request refill on medication Crestor. Patient medication has High Risk Warnings. Medication pend and sent to PCP Octavia Heir, NP for approval.

## 2023-05-16 ENCOUNTER — Other Ambulatory Visit: Payer: Self-pay | Admitting: Orthopedic Surgery

## 2023-05-16 DIAGNOSIS — E1140 Type 2 diabetes mellitus with diabetic neuropathy, unspecified: Secondary | ICD-10-CM

## 2023-05-19 DIAGNOSIS — Z961 Presence of intraocular lens: Secondary | ICD-10-CM | POA: Diagnosis not present

## 2023-05-19 DIAGNOSIS — H35371 Puckering of macula, right eye: Secondary | ICD-10-CM | POA: Diagnosis not present

## 2023-05-19 DIAGNOSIS — E119 Type 2 diabetes mellitus without complications: Secondary | ICD-10-CM | POA: Diagnosis not present

## 2023-05-19 DIAGNOSIS — H04123 Dry eye syndrome of bilateral lacrimal glands: Secondary | ICD-10-CM | POA: Diagnosis not present

## 2023-05-19 LAB — HM DIABETES EYE EXAM

## 2023-05-20 ENCOUNTER — Encounter: Payer: Self-pay | Admitting: Orthopedic Surgery

## 2023-05-20 ENCOUNTER — Ambulatory Visit: Payer: Medicare PPO | Admitting: Orthopedic Surgery

## 2023-05-20 VITALS — BP 114/74 | HR 77 | Temp 96.7°F | Resp 16 | Ht 62.0 in | Wt 147.2 lb

## 2023-05-20 DIAGNOSIS — E1140 Type 2 diabetes mellitus with diabetic neuropathy, unspecified: Secondary | ICD-10-CM

## 2023-05-20 DIAGNOSIS — I10 Essential (primary) hypertension: Secondary | ICD-10-CM

## 2023-05-20 DIAGNOSIS — E1169 Type 2 diabetes mellitus with other specified complication: Secondary | ICD-10-CM

## 2023-05-20 DIAGNOSIS — E785 Hyperlipidemia, unspecified: Secondary | ICD-10-CM | POA: Diagnosis not present

## 2023-05-20 DIAGNOSIS — R600 Localized edema: Secondary | ICD-10-CM

## 2023-05-20 NOTE — Patient Instructions (Addendum)
Recommend Dr. Dione Booze [phone] or Dr. Charlotte Sanes [phone] for eye specialists  [facility]> [phone]  Schedule fasting lab visit within next week> do not eat 12 hours prior to having labs drawn> you can only have   Compression socks> put on in AM and take off at night > good selection at [employer] > or amazon get S/M size

## 2023-05-20 NOTE — Progress Notes (Signed)
Careteam: Patient Care Team: Octavia Heir, NP as PCP - General (Adult Health Nurse Practitioner) Jodelle Red, MD as PCP - Cardiology (Cardiology) [name], MD as Consulting Physician (Ophthalmology)  Seen by: [name], AGNP-C  PLACE OF SERVICE:  [facility]  Advanced Directive information Does Patient Have a Medical Advance Directive?: Yes, Type of Advance Directive: Healthcare Power of [place];Living will;Out of facility DNR (pink MOST or yellow form), Does patient want to make changes to medical advance directive?: No - Patient declined  Allergies  Allergen Reactions   Statins Other (See Comments)    Myalgias with crestor even when taken only a few times per week   Dairycare [Bacid]     Stomach upset    Erythromycin Itching    SEVERE ITCHING   Other Other (See Comments)    NO "ACIDIC" FOODS!! They cause GI upset   Pollen Extract-Tree Extract [Pollen Extract]    Tomato Other (See Comments)    Causes GI upset   Zyrtec [Cetirizine Hcl]     Increased sleepiness    Chief Complaint  Patient presents with   Medical Management of Chronic Issues    6 month follow up.   Immunizations    Discuss the need for [employer].      HPI: Patient is a 89 y.o. female seen today for medical management of chronic conditions.   No health concerns.   Not fasting for lab work today.   T2DM- no recent hypoglycemias, following lob carb diet, remains on metformin  HTN- BP 114/74, denies chest pain, dizziness, headaches or blurred vision, remains on amlodipine  HLD- total 151, LDL 77 01/2022, remains on rosuvastatin  Lower leg swelling- improved with using recliner, discussed compression stockings today  No falls or injuries.     Review of Systems:  Review of Systems  Constitutional: Negative.   HENT: Negative.    Eyes: Negative.   Respiratory: Negative.    Cardiovascular:  Positive for leg swelling.  Gastrointestinal: Negative.   Genitourinary:  Negative.   Musculoskeletal: Negative.  Negative for falls.  Skin: Negative.   Neurological: Negative.   Psychiatric/Behavioral: Negative.      Past Medical History:  Diagnosis Date   Allergic rhinitis    Atypical chest pain 09/10/2015   Bronchitis, acute    Dyspnea on exertion    Exudative pleural effusion 5/09    sterile, benign, inflammatory   GERD (gastroesophageal reflux disease) 09/10/2015   Systolic murmur 09/10/2015   Past Surgical History:  Procedure Laterality Date   APPENDECTOMY     CATARACT EXTRACTION W/ INTRAOCULAR LENS IMPLANT Bilateral 12/2018   OOPHORECTOMY Right    thoracentesis left  5/09   Social History:   reports that she has never smoked. She has never used smokeless tobacco. She reports that she does not drink alcohol and does not use drugs.  Family History  Problem Relation Age of Onset   Allergies Mother    Heart disease Mother 75   Cancer Mother    Dementia Mother    Heart disease Father 12   Thyroid cancer Sister    Dementia Sister 72    Medications: Patient's Medications  New Prescriptions   No medications on file  Previous Medications   ACCU-CHEK SOFTCLIX LANCETS LANCETS    USE UP TO 4 TIMES DAILY AS DIRECTED   AMLODIPINE (NORVASC) 2.5 MG TABLET    TAKE 1 TABLET BY MOUTH EVERY DAY   BLOOD GLUCOSE METER KIT AND SUPPLIES  Dispense based on patient and insurance preference. Use up to four times daily as directed. (FOR ICD-10 E10.9, E11.9).   CALCIUM CARB-CHOLECALCIFEROL (CALCIUM + D3 PO)    Take 1 tablet by mouth daily.   COENZYME Q10 100 MG TABS    Take by mouth.   EZETIMIBE (ZETIA) 10 MG TABLET    TAKE 1 TABLET BY MOUTH EVERY OTHER DAY   FLUTICASONE (FLONASE) 50 MCG/ACT NASAL SPRAY    Place 1 spray into both nostrils as needed for allergies or rhinitis.   METFORMIN (GLUCOPHAGE) 500 MG TABLET    TAKE 1 TABLET BY MOUTH EVERY DAY WITH BREAKFAST   MULTIPLE VITAMINS-MINERALS (MULTIVITAMIN WOMEN 50+) TABS    Take 1 tablet by mouth daily.    PROBIOTIC PRODUCT (UP4 PROBIOTICS PO)    Take 1 tablet by mouth daily.   PROPYLENE GLYCOL (SYSTANE BALANCE OP)    Apply 1 drop to eye daily as needed (dry eyes).   ROSUVASTATIN (CRESTOR) 5 MG TABLET    TAKE 2 TABLETS (10 MG TOTAL) BY MOUTH EVERY OTHER DAY.  Modified Medications   No medications on file  Discontinued Medications   AMOXICILLIN (AMOXIL) 500 MG CAPSULE    Take 500 mg by mouth as directed.    Physical Exam:  Vitals:   05/20/23 1440  BP: 114/74  Pulse: 77  Resp: 16  Temp: (!) 96.7 F (35.9 C)  SpO2: 96%  Weight: 147 lb 3.2 oz (66.8 kg)  Height: 5\' 2"  (1.575 m)   Body mass index is 26.92 kg/m. Wt Readings from Last 3 Encounters:  05/20/23 147 lb 3.2 oz (66.8 kg)  01/28/23 151 lb 6.4 oz (68.7 kg)  11/19/22 149 lb 3.2 oz (67.7 kg)    Physical Exam Vitals reviewed.  Constitutional:      General: She is not in acute distress. HENT:     Head: Normocephalic.  Eyes:     General:        Right eye: No discharge.        Left eye: No discharge.  Cardiovascular:     Rate and Rhythm: Normal rate and regular rhythm.     Pulses: Normal pulses.     Heart sounds: Normal heart sounds.  Pulmonary:     Effort: Pulmonary effort is normal. No respiratory distress.     Breath sounds: Normal breath sounds. No wheezing or rales.  Abdominal:     General: Bowel sounds are normal.     Palpations: Abdomen is soft.  Musculoskeletal:     Cervical back: Neck supple.     Right lower leg: Edema present.     Left lower leg: Edema present.     Comments: Non pitting  Skin:    General: Skin is warm.     Capillary Refill: Capillary refill takes less than 2 seconds.  Neurological:     General: No focal deficit present.     Mental Status: She is alert and oriented to person, place, and time.  Psychiatric:        Mood and Affect: Mood normal.     Labs reviewed: Basic Metabolic Panel: Recent Labs    07/16/22 1500 11/19/22 1522  NA 139 140  K 4.1 4.2  CL 104 105  CO2 24 28   GLUCOSE 151* 98  BUN 14 15  CREATININE 0.88 0.94  CALCIUM 9.5 9.7  TSH 3.34  --    Liver Function Tests: Recent Labs    07/16/22 1500 11/19/22 1522  AST 16  19  ALT 12 17  BILITOT 0.5 0.5  PROT 6.5 6.6   No results for input(s): "LIPASE", "AMYLASE" in the last 8760 hours. No results for input(s): "AMMONIA" in the last 8760 hours. CBC: Recent Labs    07/16/22 1500  WBC 7.1  NEUTROABS 4,679  HGB 15.0  HCT 45.7*  MCV 87.9  PLT 305   Lipid Panel: No results for input(s): "CHOL", "HDL", "LDLCALC", "TRIG", "CHOLHDL", "LDLDIRECT" in the last 8760 hours. TSH: Recent Labs    07/16/22 1500  TSH 3.34   A1C: Lab Results  Component Value Date   HGBA1C 6.4 (H) 11/19/2022     Assessment/Plan 1. Controlled type 2 diabetes mellitus with diabetic neuropathy, without long-term current use of insulin (HCC) (Primary) - A1c 6.4 11/19/2022 - no hypoglycemias - cont metformin - repeat A1c  2. Essential hypertension - controlled  - cont low dose amlodipine - cont low sodium diet  3. Hyperlipidemia associated with type 2 diabetes mellitus (HCC) - total 151, LDL 77 01/2022 - cont rosuvastatin - repeat lipid panel  4. Lower leg edema - non pitting - cont daily elevation with recliner - discussed compression stockings  Total time: 32 minutes. Greater than 50% of total time spent doing patient education regarding health maintenance, T2DM, HTN, HLD and lower leg swelling including symptom/medication management.     Next appt: Visit date not found  Dyland Panuco Scherry Ran  [facility] [phone]

## 2023-05-25 ENCOUNTER — Other Ambulatory Visit: Payer: Medicare PPO

## 2023-05-25 DIAGNOSIS — E119 Type 2 diabetes mellitus without complications: Secondary | ICD-10-CM

## 2023-05-25 DIAGNOSIS — I10 Essential (primary) hypertension: Secondary | ICD-10-CM

## 2023-05-25 DIAGNOSIS — R6889 Other general symptoms and signs: Secondary | ICD-10-CM

## 2023-05-25 DIAGNOSIS — E785 Hyperlipidemia, unspecified: Secondary | ICD-10-CM | POA: Diagnosis not present

## 2023-05-25 DIAGNOSIS — E1169 Type 2 diabetes mellitus with other specified complication: Secondary | ICD-10-CM | POA: Diagnosis not present

## 2023-05-25 DIAGNOSIS — R252 Cramp and spasm: Secondary | ICD-10-CM

## 2023-05-26 ENCOUNTER — Encounter: Payer: Self-pay | Admitting: Orthopedic Surgery

## 2023-05-26 LAB — HEMOGLOBIN A1C
Hgb A1c MFr Bld: 6.6 %{Hb} — ABNORMAL HIGH (ref <5.7)
Mean Plasma Glucose: 143 mg/dL
eAG (mmol/L): 7.9 mmol/L

## 2023-05-26 LAB — BASIC METABOLIC PANEL WITH GFR
BUN: 16 mg/dL (ref 7–25)
CO2: 28 mmol/L (ref 20–32)
Calcium: 9.5 mg/dL (ref 8.6–10.4)
Chloride: 106 mmol/L (ref 98–110)
Creat: 0.84 mg/dL (ref 0.60–0.95)
Glucose, Bld: 115 mg/dL — ABNORMAL HIGH (ref 65–99)
Potassium: 4 mmol/L (ref 3.5–5.3)
Sodium: 143 mmol/L (ref 135–146)
eGFR: 67 mL/min/{1.73_m2} (ref >=60)

## 2023-05-26 LAB — LIPID PANEL
Cholesterol: 160 mg/dL (ref <200)
HDL: 55 mg/dL (ref >=50)
LDL Cholesterol (Calc): 81 mg/dL
Non-HDL Cholesterol (Calc): 105 mg/dL (ref <130)
Total CHOL/HDL Ratio: 2.9 (calc) (ref <5.0)
Triglycerides: 139 mg/dL (ref <150)

## 2023-05-26 LAB — MICROALBUMIN / CREATININE URINE RATIO
Creatinine, Urine: 26 mg/dL (ref 20–275)
Microalb Creat Ratio: 15 mg/g{creat} (ref <30)
Microalb, Ur: 0.4 mg/dL

## 2023-06-10 ENCOUNTER — Ambulatory Visit: Payer: Medicare PPO | Admitting: Orthopedic Surgery

## 2023-06-10 DIAGNOSIS — H10012 Acute follicular conjunctivitis, left eye: Secondary | ICD-10-CM | POA: Diagnosis not present

## 2023-06-10 DIAGNOSIS — H04123 Dry eye syndrome of bilateral lacrimal glands: Secondary | ICD-10-CM | POA: Diagnosis not present

## 2023-08-21 ENCOUNTER — Other Ambulatory Visit: Payer: Self-pay | Admitting: Orthopedic Surgery

## 2023-08-21 DIAGNOSIS — I10 Essential (primary) hypertension: Secondary | ICD-10-CM

## 2023-09-05 ENCOUNTER — Other Ambulatory Visit: Payer: Self-pay | Admitting: Orthopedic Surgery

## 2023-09-05 DIAGNOSIS — E782 Mixed hyperlipidemia: Secondary | ICD-10-CM

## 2023-09-24 ENCOUNTER — Other Ambulatory Visit: Payer: Self-pay | Admitting: Orthopedic Surgery

## 2023-09-24 DIAGNOSIS — E782 Mixed hyperlipidemia: Secondary | ICD-10-CM

## 2023-09-24 NOTE — Telephone Encounter (Signed)
 Pharmacy requested refill.  Pended Rx for approval due to HIGH ALERT Warning.

## 2023-11-18 ENCOUNTER — Ambulatory Visit: Payer: Medicare PPO | Admitting: Orthopedic Surgery

## 2023-11-18 ENCOUNTER — Encounter: Payer: Self-pay | Admitting: Orthopedic Surgery

## 2023-11-18 VITALS — BP 130/70 | HR 80 | Temp 97.1°F | Resp 16 | Ht 62.0 in | Wt 153.0 lb

## 2023-11-18 DIAGNOSIS — F419 Anxiety disorder, unspecified: Secondary | ICD-10-CM | POA: Diagnosis not present

## 2023-11-18 DIAGNOSIS — E1169 Type 2 diabetes mellitus with other specified complication: Secondary | ICD-10-CM | POA: Diagnosis not present

## 2023-11-18 DIAGNOSIS — E785 Hyperlipidemia, unspecified: Secondary | ICD-10-CM | POA: Diagnosis not present

## 2023-11-18 DIAGNOSIS — I10 Essential (primary) hypertension: Secondary | ICD-10-CM | POA: Diagnosis not present

## 2023-11-18 DIAGNOSIS — E1140 Type 2 diabetes mellitus with diabetic neuropathy, unspecified: Secondary | ICD-10-CM

## 2023-11-18 DIAGNOSIS — R635 Abnormal weight gain: Secondary | ICD-10-CM

## 2023-11-18 LAB — COMPREHENSIVE METABOLIC PANEL WITH GFR
AG Ratio: 2 (calc) (ref 1.0–2.5)
ALT: 13 U/L (ref 6–29)
AST: 17 U/L (ref 10–35)
Albumin: 4.3 g/dL (ref 3.6–5.1)
Alkaline phosphatase (APISO): 71 U/L (ref 37–153)
BUN: 12 mg/dL (ref 7–25)
CO2: 30 mmol/L (ref 20–32)
Calcium: 9.5 mg/dL (ref 8.6–10.4)
Chloride: 104 mmol/L (ref 98–110)
Creat: 0.86 mg/dL (ref 0.60–0.95)
Globulin: 2.2 g/dL (ref 1.9–3.7)
Glucose, Bld: 168 mg/dL — ABNORMAL HIGH (ref 65–99)
Potassium: 4 mmol/L (ref 3.5–5.3)
Sodium: 141 mmol/L (ref 135–146)
Total Bilirubin: 0.4 mg/dL (ref 0.2–1.2)
Total Protein: 6.5 g/dL (ref 6.1–8.1)
eGFR: 65 mL/min/1.73m2 (ref >=60)

## 2023-11-18 LAB — CBC WITH DIFFERENTIAL/PLATELET
Absolute Lymphocytes: 1485 {cells}/uL (ref 850–3900)
Absolute Monocytes: 521 {cells}/uL (ref 200–950)
Basophils Absolute: 40 {cells}/uL (ref 0–200)
Basophils Relative: 0.6 %
Eosinophils Absolute: 198 {cells}/uL (ref 15–500)
Eosinophils Relative: 3 %
HCT: 44.5 % (ref 35.0–45.0)
Hemoglobin: 14.1 g/dL (ref 11.7–15.5)
MCH: 29 pg (ref 27.0–33.0)
MCHC: 31.7 g/dL — ABNORMAL LOW (ref 32.0–36.0)
MCV: 91.6 fL (ref 80.0–100.0)
MPV: 10.2 fL (ref 7.5–12.5)
Monocytes Relative: 7.9 %
Neutro Abs: 4356 {cells}/uL (ref 1500–7800)
Neutrophils Relative %: 66 %
Platelets: 301 Thousand/uL (ref 140–400)
RBC: 4.86 Million/uL (ref 3.80–5.10)
RDW: 12.5 % (ref 11.0–15.0)
Total Lymphocyte: 22.5 %
WBC: 6.6 Thousand/uL (ref 3.8–10.8)

## 2023-11-18 LAB — HEMOGLOBIN A1C
Hgb A1c MFr Bld: 6.7 % — ABNORMAL HIGH (ref <5.7)
Mean Plasma Glucose: 146 mg/dL
eAG (mmol/L): 8.1 mmol/L

## 2023-11-18 LAB — TSH: TSH: 3.19 m[IU]/L (ref 0.40–4.50)

## 2023-11-18 MED ORDER — SERTRALINE HCL 25 MG PO TABS: 25.0000 mg | ORAL_TABLET | Freq: Every day | ORAL | 3 refills | Status: DC

## 2023-11-18 NOTE — Patient Instructions (Addendum)
 You do not have to take blood sugars daily> do it when you do not feel well   Will start Zoloft  for anxiety> take once daily> safe with other medications> will take 3-4 weeks to work> follow up with me in 1 month

## 2023-11-18 NOTE — Progress Notes (Signed)
 Careteam: Patient Care Team: Gil Sara BRAVO, NP as PCP - General (Adult Health Nurse Practitioner) [name], MD as PCP - Cardiology (Cardiology) [name], MD as Consulting Physician (Ophthalmology)  Seen by: Sara Gil, AGNP-C  PLACE OF SERVICE:  [facility]  Advanced Directive information    Allergies  Allergen Reactions   Statins Other (See Comments)    Myalgias with crestor  even when taken only a few times per week   Dairycare [Bacid]     Stomach upset    Erythromycin Itching    SEVERE ITCHING   Other Other (See Comments)    NO ACIDIC FOODS!! They cause GI upset   Pollen Extract-Tree Extract [Pollen Extract]    Tomato Other (See Comments)    Causes GI upset   Zyrtec  [Cetirizine  Hcl]     Increased sleepiness    Chief Complaint  Patient presents with   Medical Management of Chronic Issues    6 month follow up. Discuss the need for Covid Booster, Foot exam, Eye exam, and AWV.      HPI: Patient is a 89 y.o. female seen today for medical management of chronic conditions.   Discussed the use of AI scribe software for clinical note transcription with the patient, who gave verbal consent to proceed.  History of Present Illness    She continues to take blood pressured daily. Most readings < 140/90. Adherent to low sodium diet. Remains on low dose amlodipine .   Regarding her diabetes, she is not currently using fingerstick glucose monitoring but relies on a watch to track her A1c levels. Recent A1c readings have been 4.9, 6.0, and 7.6, with a previous A1c of 6.6 in January. She is taking metformin  as prescribed. Denies hypoglycemic event.   She feels more anxious recently, attributing it to everyday life and aging. No panic attacks are reported, but she describes her nerves as 'shot.' She recalls purchasing an herbal remedy for anxiety but lost the bottle and is seeking advice on safe options to manage her anxiety. Anxiety is affecting sleep.   Her  family history includes a sister with dementia who is in a care facility. Her sister's husband visits her twice a week and communicates with her about her sister's condition.      Review of Systems:  Review of Systems  Constitutional: Negative.   HENT: Negative.    Eyes: Negative.   Respiratory: Negative.    Cardiovascular: Negative.   Gastrointestinal: Negative.   Genitourinary: Negative.   Musculoskeletal: Negative.   Skin: Negative.   Neurological: Negative.   Endo/Heme/Allergies: Negative.   Psychiatric/Behavioral:  Negative for depression. The patient is nervous/anxious and has insomnia.     Past Medical History:  Diagnosis Date   Allergic rhinitis    Atypical chest pain 09/10/2015   Bronchitis, acute    Dyspnea on exertion    Exudative pleural effusion 5/09    sterile, benign, inflammatory   GERD (gastroesophageal reflux disease) 09/10/2015   Systolic murmur 09/10/2015   Past Surgical History:  Procedure Laterality Date   APPENDECTOMY     CATARACT EXTRACTION W/ INTRAOCULAR LENS IMPLANT Bilateral 12/2018   OOPHORECTOMY Right    thoracentesis left  5/09   Social History:   reports that she has never smoked. She has never used smokeless tobacco. She reports that she does not drink alcohol  and does not use drugs.  Family History  Problem Relation Age of Onset   Allergies Mother    Heart disease Mother 55  Cancer Mother    Dementia Mother    Heart disease Father 77   Thyroid cancer Sister    Dementia Sister 64    Medications: Patient's Medications  New Prescriptions   No medications on file  Previous Medications   ACCU-CHEK SOFTCLIX LANCETS LANCETS    USE UP TO 4 TIMES DAILY AS DIRECTED   AMLODIPINE  (NORVASC ) 2.5 MG TABLET    TAKE 1 TABLET BY MOUTH EVERY DAY   BLOOD GLUCOSE METER KIT AND SUPPLIES    Dispense based on patient and insurance preference. Use up to four times daily as directed. (FOR ICD-10 E10.9, E11.9).   CALCIUM  CARB-CHOLECALCIFEROL (CALCIUM  + D3  PO)    Take 1 tablet by mouth daily.   COENZYME Q10 100 MG TABS    Take by mouth.   EZETIMIBE  (ZETIA ) 10 MG TABLET    TAKE 1 TABLET BY MOUTH EVERY OTHER DAY   FLUTICASONE  (FLONASE ) 50 MCG/ACT NASAL SPRAY    Place 1 spray into both nostrils as needed for allergies or rhinitis.   METFORMIN  (GLUCOPHAGE ) 500 MG TABLET    TAKE 1 TABLET BY MOUTH EVERY DAY WITH BREAKFAST   MULTIPLE VITAMINS-MINERALS (MULTIVITAMIN WOMEN 50+) TABS    Take 1 tablet by mouth daily.   PROBIOTIC PRODUCT (UP4 PROBIOTICS PO)    Take 1 tablet by mouth daily.   PROPYLENE GLYCOL (SYSTANE BALANCE OP)    Apply 1 drop to eye daily as needed (dry eyes).   ROSUVASTATIN  (CRESTOR ) 5 MG TABLET    TAKE 2 TABLETS (10 MG TOTAL) BY MOUTH EVERY OTHER DAY.  Modified Medications   No medications on file  Discontinued Medications   No medications on file    Physical Exam:  Vitals:   11/18/23 1502  BP: 130/70  Pulse: 80  Resp: 16  Temp: (!) 97.1 F (36.2 C)  SpO2: 95%  Weight: 153 lb (69.4 kg)  Height: 5' 2 (1.575 m)   Body mass index is 27.98 kg/m. Wt Readings from Last 3 Encounters:  11/18/23 153 lb (69.4 kg)  05/20/23 147 lb 3.2 oz (66.8 kg)  01/28/23 151 lb 6.4 oz (68.7 kg)    Physical Exam Vitals reviewed.  Constitutional:      General: She is not in acute distress. HENT:     Head: Normocephalic.  Eyes:     General:        Right eye: No discharge.        Left eye: No discharge.  Cardiovascular:     Rate and Rhythm: Normal rate and regular rhythm.     Pulses: Normal pulses.     Heart sounds: Normal heart sounds.  Pulmonary:     Effort: Pulmonary effort is normal.     Breath sounds: Normal breath sounds.  Abdominal:     General: Bowel sounds are normal.     Palpations: Abdomen is soft.  Musculoskeletal:     Cervical back: Neck supple.     Right lower leg: No edema.     Left lower leg: No edema.  Skin:    General: Skin is warm.     Capillary Refill: Capillary refill takes less than 2 seconds.   Neurological:     General: No focal deficit present.     Mental Status: She is alert and oriented to person, place, and time.     Motor: No weakness.     Gait: Gait normal.  Psychiatric:        Mood and Affect: Mood normal.  Labs reviewed: Basic Metabolic Panel: Recent Labs    11/19/22 1522 05/25/23 1105  NA 140 143  K 4.2 4.0  CL 105 106  CO2 28 28  GLUCOSE 98 115*  BUN 15 16  CREATININE 0.94 0.84  CALCIUM  9.7 9.5   Liver Function Tests: Recent Labs    11/19/22 1522  AST 19  ALT 17  BILITOT 0.5  PROT 6.6   No results for input(s): LIPASE, AMYLASE in the last 8760 hours. No results for input(s): AMMONIA in the last 8760 hours. CBC: No results for input(s): WBC, NEUTROABS, HGB, HCT, MCV, PLT in the last 8760 hours. Lipid Panel: Recent Labs    05/25/23 1105  CHOL 160  HDL 55  LDLCALC 81  TRIG 139  CHOLHDL 2.9   TSH: No results for input(s): TSH in the last 8760 hours. A1C: Lab Results  Component Value Date   HGBA1C 6.6 (H) 05/25/2023     Assessment/Plan 1. Controlled type 2 diabetes mellitus with diabetic neuropathy, without long-term current use of insulin (HCC) (Primary) - A1c 6.7> was 6.6, goal < 7.0 - 6 lb weigh gain from last encounter - eye exam 05/19/2023> Dr. Marcey - no hypoglycemias - cont metformin   - Hemoglobin A1c - Complete Metabolic Panel with eGFR  2. Essential hypertension - BUN/creat 12/0.86 11/18/2023 - cont low dose amlodipine  - CBC with Differential/Platelet  3. Hyperlipidemia associated with type 2 diabetes mellitus (HCC) - total 160, LDL 81, goal LDL < 70 - cont rosuvastatin   4. Weight gain - weight up 6 lbs  - recommend limiting sweets/desserts - discussed light exercise like walking  - TSH> 3.19 11/18/2023  5. Anxiety - ongoing - no recent panic - will try low dose Zoloft  - Na+ 141 11/18/2023 - sertraline  (ZOLOFT ) 25 MG tablet; Take 1 tablet (25 mg total) by mouth daily.  Dispense: 30  tablet; Refill: 3  Total time: 35 minutes. Greater than 50% of total time spent doing patient education regarding T2DM, HTN, weight loss and anxiety including symptom/medication management.     Next appt: 11/20/2023  Sara Charles, Sara Charles  [facility] [phone]

## 2023-11-19 ENCOUNTER — Ambulatory Visit: Payer: Self-pay | Admitting: Orthopedic Surgery

## 2023-11-20 ENCOUNTER — Other Ambulatory Visit: Payer: Self-pay | Admitting: Orthopedic Surgery

## 2023-11-20 DIAGNOSIS — E1140 Type 2 diabetes mellitus with diabetic neuropathy, unspecified: Secondary | ICD-10-CM

## 2023-12-11 ENCOUNTER — Other Ambulatory Visit: Payer: Self-pay | Admitting: Orthopedic Surgery

## 2023-12-11 DIAGNOSIS — F419 Anxiety disorder, unspecified: Secondary | ICD-10-CM

## 2023-12-23 ENCOUNTER — Encounter: Payer: Self-pay | Admitting: Orthopedic Surgery

## 2023-12-23 ENCOUNTER — Ambulatory Visit: Admitting: Orthopedic Surgery

## 2023-12-23 VITALS — BP 98/76 | HR 85 | Temp 96.6°F | Resp 16 | Ht 62.0 in | Wt 151.8 lb

## 2023-12-23 DIAGNOSIS — H6123 Impacted cerumen, bilateral: Secondary | ICD-10-CM

## 2023-12-23 DIAGNOSIS — E2839 Other primary ovarian failure: Secondary | ICD-10-CM | POA: Diagnosis not present

## 2023-12-23 DIAGNOSIS — F419 Anxiety disorder, unspecified: Secondary | ICD-10-CM

## 2023-12-23 LAB — BASIC METABOLIC PANEL WITH GFR
BUN: 20 mg/dL (ref 7–25)
CO2: 27 mmol/L (ref 20–32)
Calcium: 9.9 mg/dL (ref 8.6–10.4)
Chloride: 103 mmol/L (ref 98–110)
Creat: 0.83 mg/dL (ref 0.60–0.95)
Glucose, Bld: 100 mg/dL — ABNORMAL HIGH (ref 65–99)
Potassium: 4.1 mmol/L (ref 3.5–5.3)
Sodium: 138 mmol/L (ref 135–146)
eGFR: 68 mL/min/1.73m2 (ref >=60)

## 2023-12-23 NOTE — Progress Notes (Signed)
 Careteam: Patient Care Team: [name], NP as PCP - General (Adult Health Nurse Practitioner) [name], MD as PCP - Cardiology (Cardiology) [name], MD as Consulting Physician (Ophthalmology)  Seen by: [name], AGNP-C  PLACE OF SERVICE:  [facility]  Advanced Directive information    Allergies  Allergen Reactions   Statins Other (See Comments)    Myalgias with crestor  even when taken only a few times per week   Dairycare [Bacid]     Stomach upset    Erythromycin Itching    SEVERE ITCHING   Other Other (See Comments)    NO ACIDIC FOODS!! They cause GI upset   Pollen Extract-Tree Extract [Pollen Extract]    Tomato Other (See Comments)    Causes GI upset   Zyrtec  [Cetirizine  Hcl]     Increased sleepiness    Chief Complaint  Patient presents with   Anxiety    Follow up on anxiety medication.      HPI: Patient is a 89 y.o. female seen today for follow up on anxiety.   Discussed the use of AI scribe software for clinical note transcription with the patient, who gave verbal consent to proceed.  History of Present Illness   Sara Charles is an 89 year old female who presents for follow-up of anxiety and depression management.  She started Zoloft  for anxiety and reports feeling calmer and overall better. However, she has experienced an increase in tinnitus since starting the medication. No sedation effects from the medication have been observed. Plan to check sodium levels today.   She has a history of good bone health, with her last bone density test in 2021 showing normal results. She has not taken calcium  supplements for the past two months due to concerns about potential cardiovascular risks. She is considering scheduling a bone density test for next year.  She has a history of earwax buildup, particularly in her right ear, which she believes may be contributing to her tinnitus. She has not had her hearing assessed but is  considering it, as she has difficulty hearing in certain situations.   Review of Systems:  Review of Systems  Constitutional: Negative.   HENT:  Positive for tinnitus.   Respiratory: Negative.    Cardiovascular: Negative.   Neurological:  Negative for dizziness.  Psychiatric/Behavioral:  Negative for depression. The patient is nervous/anxious. The patient does not have insomnia.     Past Medical History:  Diagnosis Date   Allergic rhinitis    Atypical chest pain 09/10/2015   Bronchitis, acute    Dyspnea on exertion    Exudative pleural effusion 5/09    sterile, benign, inflammatory   GERD (gastroesophageal reflux disease) 09/10/2015   Systolic murmur 09/10/2015   Past Surgical History:  Procedure Laterality Date   APPENDECTOMY     CATARACT EXTRACTION W/ INTRAOCULAR LENS IMPLANT Bilateral 12/2018   OOPHORECTOMY Right    thoracentesis left  5/09   Social History:   reports that she has never smoked. She has never used smokeless tobacco. She reports that she does not drink alcohol  and does not use drugs.  Family History  Problem Relation Age of Onset   Allergies Mother    Heart disease Mother 71   Cancer Mother    Dementia Mother    Heart disease Father 61   Thyroid cancer Sister    Dementia Sister 52    Medications: Patient's Medications  New Prescriptions   No medications on file  Previous Medications   ACCU-CHEK SOFTCLIX LANCETS LANCETS    USE UP TO 4 TIMES DAILY AS DIRECTED   AMLODIPINE  (NORVASC ) 2.5 MG TABLET    TAKE 1 TABLET BY MOUTH EVERY DAY   BLOOD GLUCOSE METER KIT AND SUPPLIES    Dispense based on patient and insurance preference. Use up to four times daily as directed. (FOR ICD-10 E10.9, E11.9).   CALCIUM  CARB-CHOLECALCIFEROL (CALCIUM  + D3 PO)    Take 1 tablet by mouth daily.   COENZYME Q10 100 MG TABS    Take by mouth.   EZETIMIBE  (ZETIA ) 10 MG TABLET    TAKE 1 TABLET BY MOUTH EVERY OTHER DAY   FLUTICASONE  (FLONASE ) 50 MCG/ACT NASAL SPRAY    Place 1 spray  into both nostrils as needed for allergies or rhinitis.   METFORMIN  (GLUCOPHAGE ) 500 MG TABLET    TAKE 1 TABLET BY MOUTH EVERY DAY WITH BREAKFAST   MULTIPLE VITAMINS-MINERALS (MULTIVITAMIN WOMEN 50+) TABS    Take 1 tablet by mouth daily.   PROBIOTIC PRODUCT (UP4 PROBIOTICS PO)    Take 1 tablet by mouth daily.   PROPYLENE GLYCOL (SYSTANE BALANCE OP)    Apply 1 drop to eye daily as needed (dry eyes).   ROSUVASTATIN  (CRESTOR ) 5 MG TABLET    TAKE 2 TABLETS (10 MG TOTAL) BY MOUTH EVERY OTHER DAY.   SERTRALINE  (ZOLOFT ) 25 MG TABLET    Take 1 tablet (25 mg total) by mouth daily.  Modified Medications   No medications on file  Discontinued Medications   No medications on file    Physical Exam:  Vitals:   12/23/23 1445  BP: 98/76  Pulse: 85  Resp: 16  Temp: (!) 96.6 F (35.9 C)  SpO2: 94%  Weight: 151 lb 12.8 oz (68.9 kg)  Height: 5' 2 (1.575 m)   Body mass index is 27.76 kg/m. Wt Readings from Last 3 Encounters:  12/23/23 151 lb 12.8 oz (68.9 kg)  11/18/23 153 lb (69.4 kg)  05/20/23 147 lb 3.2 oz (66.8 kg)    Physical Exam Vitals reviewed.  Constitutional:      General: She is not in acute distress. HENT:     Head: Normocephalic.     Right Ear: There is impacted cerumen.     Left Ear: There is impacted cerumen.  Eyes:     General:        Right eye: No discharge.        Left eye: No discharge.  Cardiovascular:     Rate and Rhythm: Normal rate and regular rhythm.     Pulses: Normal pulses.     Heart sounds: Normal heart sounds.  Pulmonary:     Effort: Pulmonary effort is normal.     Breath sounds: Normal breath sounds.  Abdominal:     General: Bowel sounds are normal.     Palpations: Abdomen is soft.  Musculoskeletal:        General: Normal range of motion.     Cervical back: Neck supple.  Skin:    General: Skin is warm.     Capillary Refill: Capillary refill takes less than 2 seconds.  Neurological:     General: No focal deficit present.     Mental Status: She  is alert and oriented to person, place, and time.  Psychiatric:        Mood and Affect: Mood normal.     Labs reviewed: Basic Metabolic Panel: Recent Labs    05/25/23 1105 11/18/23 1540  NA  143 141  K 4.0 4.0  CL 106 104  CO2 28 30  GLUCOSE 115* 168*  BUN 16 12  CREATININE 0.84 0.86  CALCIUM  9.5 9.5  TSH  --  3.19   Liver Function Tests: Recent Labs    11/18/23 1540  AST 17  ALT 13  BILITOT 0.4  PROT 6.5   No results for input(s): LIPASE, AMYLASE in the last 8760 hours. No results for input(s): AMMONIA in the last 8760 hours. CBC: Recent Labs    11/18/23 1540  WBC 6.6  NEUTROABS 4,356  HGB 14.1  HCT 44.5  MCV 91.6  PLT 301   Lipid Panel: Recent Labs    05/25/23 1105  CHOL 160  HDL 55  LDLCALC 81  TRIG 139  CHOLHDL 2.9   TSH: Recent Labs    11/18/23 1540  TSH 3.19   A1C: Lab Results  Component Value Date   HGBA1C 6.7 (H) 11/18/2023     Assessment/Plan 1. Anxiety (Primary) - Zoloft  started last month - improved anxiety, but increased tinnitus - will check sodium today - advised to wean off Zoloft  every other day x 7 days then stop - recommend Celexa  if Na+ normal - Basic Metabolic Panel with eGFR  2. Estrogen deficiency - DEXA 2021> normal - stopped calcium  supplement 2 months ago - cont vitamin D  - DG Bone Density; Future> [facility]  3. Bilateral impacted cerumen - start Debrox 5 gtts BID x 5 days - schedule ear flush   Total time: 31 minutes. Greater than 50% of total time spent doing patient education regarding health maintenance, anxiety and cerumen impaction including symptom/medication management.    Next appt: Visit date not found  Mikeal Winstanley Gil BODILY  [facility] [phone]

## 2023-12-23 NOTE — Patient Instructions (Addendum)
 Take Zoloft  every other day for one week and then stop completely  We will check sodium level today, if no issues I will send new medication  [facility] [phone]  Schedule bone density with [facility]> [phone]  Debrox (carbamide peroxide 6.5%) drops> purchase at local pharmacy> place 5 drops to each ear twice daily x 5 days> we can flush ears, schedule appointment

## 2023-12-24 ENCOUNTER — Ambulatory Visit: Payer: Self-pay | Admitting: Orthopedic Surgery

## 2023-12-24 DIAGNOSIS — F419 Anxiety disorder, unspecified: Secondary | ICD-10-CM

## 2023-12-24 MED ORDER — CITALOPRAM HYDROBROMIDE 10 MG PO TABS: 10.0000 mg | ORAL_TABLET | Freq: Every day | ORAL | 3 refills | Status: DC

## 2024-01-20 ENCOUNTER — Other Ambulatory Visit: Payer: Self-pay | Admitting: Orthopedic Surgery

## 2024-01-20 DIAGNOSIS — F419 Anxiety disorder, unspecified: Secondary | ICD-10-CM

## 2024-01-20 NOTE — Telephone Encounter (Signed)
 Patient was 90 tablets and 2 refills. Medication pend and sent to PCP [name], NP or approval.

## 2024-01-26 DIAGNOSIS — H6123 Impacted cerumen, bilateral: Secondary | ICD-10-CM | POA: Diagnosis not present

## 2024-02-03 ENCOUNTER — Ambulatory Visit: Admitting: Orthopedic Surgery

## 2024-02-18 ENCOUNTER — Encounter: Payer: Self-pay | Admitting: Gastroenterology

## 2024-02-18 ENCOUNTER — Ambulatory Visit

## 2024-02-18 ENCOUNTER — Ambulatory Visit: Admitting: Gastroenterology

## 2024-02-18 ENCOUNTER — Ambulatory Visit: Payer: Self-pay | Admitting: Gastroenterology

## 2024-02-18 ENCOUNTER — Other Ambulatory Visit

## 2024-02-18 VITALS — BP 122/78 | HR 82 | Ht 62.0 in | Wt 151.0 lb

## 2024-02-18 DIAGNOSIS — R0789 Other chest pain: Secondary | ICD-10-CM

## 2024-02-18 DIAGNOSIS — R1012 Left upper quadrant pain: Secondary | ICD-10-CM

## 2024-02-18 DIAGNOSIS — K219 Gastro-esophageal reflux disease without esophagitis: Secondary | ICD-10-CM

## 2024-02-18 DIAGNOSIS — R079 Chest pain, unspecified: Secondary | ICD-10-CM | POA: Diagnosis not present

## 2024-02-18 LAB — COMPREHENSIVE METABOLIC PANEL WITH GFR
ALT: 12 U/L (ref 0–35)
AST: 14 U/L (ref 0–37)
Albumin: 4.6 g/dL (ref 3.5–5.2)
Alkaline Phosphatase: 83 U/L (ref 39–117)
BUN: 18 mg/dL (ref 6–23)
CO2: 29 meq/L (ref 19–32)
Calcium: 9.4 mg/dL (ref 8.4–10.5)
Chloride: 102 meq/L (ref 96–112)
Creatinine, Ser: 0.92 mg/dL (ref 0.40–1.20)
GFR: 55.79 mL/min — ABNORMAL LOW (ref >60.00)
Glucose, Bld: 128 mg/dL — ABNORMAL HIGH (ref 70–99)
Potassium: 4 meq/L (ref 3.5–5.1)
Sodium: 139 meq/L (ref 135–145)
Total Bilirubin: 0.5 mg/dL (ref 0.2–1.2)
Total Protein: 7.4 g/dL (ref 6.0–8.3)

## 2024-02-18 LAB — CBC WITH DIFFERENTIAL/PLATELET
Basophils Absolute: 0.1 K/uL (ref 0.0–0.1)
Basophils Relative: 0.9 % (ref 0.0–3.0)
Eosinophils Absolute: 0.3 K/uL (ref 0.0–0.7)
Eosinophils Relative: 3.5 % (ref 0.0–5.0)
HCT: 44.5 % (ref 36.0–46.0)
Hemoglobin: 15 g/dL (ref 12.0–15.0)
Lymphocytes Relative: 18.4 % (ref 12.0–46.0)
Lymphs Abs: 1.4 K/uL (ref 0.7–4.0)
MCHC: 33.7 g/dL (ref 30.0–36.0)
MCV: 86.5 fl (ref 78.0–100.0)
Monocytes Absolute: 0.6 K/uL (ref 0.1–1.0)
Monocytes Relative: 8.6 % (ref 3.0–12.0)
Neutro Abs: 5.2 K/uL (ref 1.4–7.7)
Neutrophils Relative %: 68.6 % (ref 43.0–77.0)
Platelets: 277 K/uL (ref 150.0–400.0)
RBC: 5.15 Mil/uL — ABNORMAL HIGH (ref 3.87–5.11)
RDW: 13 % (ref 11.5–15.5)
WBC: 7.6 K/uL (ref 4.0–10.5)

## 2024-02-18 LAB — LIPASE: Lipase: 13 U/L (ref 11.0–59.0)

## 2024-02-18 NOTE — Patient Instructions (Addendum)
 Follow up closely with Primary care.  Can start Tylenol  but do not exceed the recommended dose.   We have sent the following medications to your pharmacy for you to pick up at your convenience: Omeprazole  40 mg daily  Your provider has requested that you go to the basement level for lab work before leaving today. Press B on the elevator. The lab is located at the first door on the left as you exit the elevator.   Your provider has requested that you have an abdominal and chest x ray today. Please go to [facility] Radiology department for the test.   _______________________________________________________  If your blood pressure at your visit was 140/90 or greater, please contact your primary care physician to follow up on this.  _______________________________________________________  If you are age 31 or older, your body mass index should be between 23-30. Your Body mass index is 27.62 kg/m. If this is out of the aforementioned range listed, please consider follow up with your Primary Care Provider.  If you are age 24 or younger, your body mass index should be between 19-25. Your Body mass index is 27.62 kg/m. If this is out of the aformentioned range listed, please consider follow up with your Primary Care Provider.   ________________________________________________________  The [facility] providers would like to encourage you to use MYCHART to communicate with providers for non-urgent requests or questions.  Due to long hold times on the telephone, sending your provider a message by [facility] may be a faster and more efficient way to get a response.  Please allow 48 business hours for a response.  Please remember that this is for non-urgent requests.  _______________________________________________________  [facility] is using a team-based approach to care.  Your team is made up of your doctor and two to three APPS. Our APPS (Nurse Practitioners and Physician  Assistants) work with your physician to ensure care continuity for you. They are fully qualified to address your health concerns and develop a treatment plan. They communicate directly with your gastroenterologist to care for you. Seeing the Advanced Practice Practitioners on your physician's team can help you by facilitating care more promptly, often allowing for earlier appointments, access to diagnostic testing, procedures, and other specialty referrals.

## 2024-02-18 NOTE — Progress Notes (Signed)
 Sara Charles [id number] 1933-05-16   Chief Complaint: Abdominal pain  Referring Provider: [name], NP Primary GI MD: Dr. Avram  HPI: Sara Charles is a 89 y.o. female with past medical history of GERD, appendectomy, anxiety, T2DM, HTN, HLD who presents today for a complaint of abdominal pain.    Patient last seen in office 06/08/2016 by Greig Corti, PA-C for positive Cologuard test.  Patient asymptomatic at that time aside from occasional constipation.  Noted to have previously had an incomplete colonoscopy done in 2000 due to tortuous redundant colon, with Dr. [facility].  Was told she cannot have any further colonoscopies.  Underwent virtual colonoscopy in 2008 which was negative.  No family history of colon cancer.  Does have history of GERD and IBS. At that time also endorsed solid food dysphagia, was using omeprazole  20 mg daily for control of heartburn. Plan at that time was to proceed with virtual colonoscopy and if positive proceed to standard colonoscopy.  Barium swallow with tablet ordered for dysphagia and was started on Zantac  instead of omeprazole .  She had a negative virtual colonoscopy exam 06/18/2016.  Barium swallow also normal 06/25/2016.  Last seen by primary care 12/23/2023 for follow-up of anxiety.  Labs 11/18/2023: Normal CBC, elevated blood glucose otherwise normal CMP, normal TSH, hemoglobin A1c 6.7   Discussed the use of AI scribe software for clinical note transcription with the patient, who gave verbal consent to proceed.  History of Present Illness Sara Charles is an 89 year old female who presents with upper abdominal pain.  She has experienced left upper quadrant abdominal pain for about a week, localized and tender to touch, sometimes worsening with movement. The pain has slightly improved over the past few days.  She reports history of occasional constipation.  Not taking anything for this at this time.  Last bowel movement was  yesterday, and she typically has a bowel movement daily.  Stool yesterday was loose but not watery.  Has not had a bowel movement yet today.  Does not feel that hard stools or constipation are much of a concern right now but has noticed increased gas.  There is no nausea, vomiting, fever, chest pain, shortness of breath, or blood in stool. Stools are normal in color.  She has a history of GERD and was previously on PPI for similar symptoms but discontinued due to concern for risk of side effects. She has been using probiotics for years without these symptoms until recently.   She has not had any recent imaging of her abdomen. She has not taken any medication for the current pain, such as Tylenol , nor has she applied heat. The pain is not severe enough to wake her at night but is more noticeable during the day, especially with movement.  Has not seen her PCP regarding this pain.  Symptoms ongoing for about 5 days, and gradually improving.  Reproducible on palpation.  Worse with movement.  Does not seem to be aggravated by eating, though she does state that she ate some tomatoes prior to onset of pain and thinks this may have aggravated some GERD like symptoms.  She denies any heartburn, acid reflux, nausea, vomiting, fever, chills.  Pain does not radiate.  She denies any severe chest pain, denies shortness of breath.  No weakness or dizziness.  Pain not worse with deep inhalation.   Previous GI Procedures/Imaging      Past Medical History:  Diagnosis Date   Allergic rhinitis  Atypical chest pain 09/10/2015   Bronchitis, acute    Dyspnea on exertion    Exudative pleural effusion 5/09    sterile, benign, inflammatory   GERD (gastroesophageal reflux disease) 09/10/2015   Systolic murmur 09/10/2015    Past Surgical History:  Procedure Laterality Date   APPENDECTOMY     CATARACT EXTRACTION W/ INTRAOCULAR LENS IMPLANT Bilateral 12/2018   OOPHORECTOMY Right    thoracentesis left  5/09     Current Outpatient Medications  Medication Sig Dispense Refill   Accu-Chek Softclix Lancets lancets USE UP TO 4 TIMES DAILY AS DIRECTED 200 each 1   amLODipine  (NORVASC ) 2.5 MG tablet TAKE 1 TABLET BY MOUTH EVERY DAY 90 tablet 1   blood glucose meter kit and supplies Dispense based on patient and insurance preference. Use up to four times daily as directed. (FOR ICD-10 E10.9, E11.9). 1 each 0   Coenzyme Q10 100 MG TABS Take by mouth.     ezetimibe  (ZETIA ) 10 MG tablet TAKE 1 TABLET BY MOUTH EVERY OTHER DAY 45 tablet 1   fluticasone  (FLONASE ) 50 MCG/ACT nasal spray Place 1 spray into both nostrils as needed for allergies or rhinitis.     metFORMIN  (GLUCOPHAGE ) 500 MG tablet TAKE 1 TABLET BY MOUTH EVERY DAY WITH BREAKFAST 90 tablet 1   Multiple Vitamins-Minerals (MULTIVITAMIN WOMEN 50+) TABS Take 1 tablet by mouth daily.     Probiotic Product (UP4 PROBIOTICS PO) Take 1 tablet by mouth daily.     Propylene Glycol (SYSTANE BALANCE OP) Apply 1 drop to eye daily as needed (dry eyes).     rosuvastatin  (CRESTOR ) 5 MG tablet TAKE 2 TABLETS (10 MG TOTAL) BY MOUTH EVERY OTHER DAY. 90 tablet 1   citalopram  (CELEXA ) 10 MG tablet TAKE 1 TABLET BY MOUTH EVERY DAY 90 tablet 2   No current facility-administered medications for this visit.    Allergies as of 02/18/2024 - Review Complete 02/18/2024  Allergen Reaction Noted   Statins Other (See Comments) 08/25/2019   Dairycare [bacid]  06/27/2020   Erythromycin Itching    Other Other (See Comments) 09/11/2017   Pollen extract-tree extract [pollen extract]  11/13/2020   Tomato Other (See Comments) 09/11/2017   Zyrtec  [cetirizine  hcl]  01/02/2021    Family History  Problem Relation Age of Onset   Allergies Mother    Heart disease Mother 62   Cancer Mother    Dementia Mother    Heart disease Father 17   Thyroid cancer Sister    Dementia Sister 42    Social History   Tobacco Use   Smoking status: Never   Smokeless tobacco: Never  Vaping  Use   Vaping status: Never Used  Substance Use Topics   Alcohol  use: No   Drug use: No     Review of Systems:    Constitutional: No weight loss, fever, chills Cardiovascular: No chest pain, chest pressure or palpitations   Respiratory: No SOB or cough Gastrointestinal: See HPI and otherwise negative Neurological: No headache, dizziness or syncope   Physical Exam:  Vital signs: BP 122/78   Pulse 82   Ht 5' 2 (1.575 m)   Wt 151 lb (68.5 kg)   BMI 27.62 kg/m   Wt Readings from Last 3 Encounters:  02/18/24 151 lb (68.5 kg)  12/23/23 151 lb 12.8 oz (68.9 kg)  11/18/23 153 lb (69.4 kg)     Constitutional: Pleasant, well-appearing elderly female in NAD, alert and cooperative Head:  Normocephalic and atraumatic.  Eyes: No scleral  icterus.  Respiratory: Respirations even and unlabored. Lungs clear to auscultation bilaterally.  No wheezes, crackles, or rhonchi.  Cardiovascular:  Regular rate and rhythm. No murmurs. No peripheral edema. Gastrointestinal:  Soft, nondistended, very minimal tenderness to palpation of LUQ. No rebound or guarding. Normal bowel sounds. No appreciable masses or hepatomegaly. There is reproducible tenderness to palpation over left lower ribs anteriorly and laterally.  No overlying skin changes, no swelling or ecchymosis.  Pain is worse when patient moves to lay down on the exam table or sit back up. Rectal:  Not performed.  Neurologic:  Alert and oriented x4;  grossly normal neurologically.  Skin:   Dry and intact without significant lesions or rashes. Psychiatric: Oriented to person, place and time. Demonstrates good judgement and reason without abnormal affect or behaviors.   RELEVANT LABS AND IMAGING: CBC    Component Value Date/Time   WBC 6.6 11/18/2023 1540   RBC 4.86 11/18/2023 1540   HGB 14.1 11/18/2023 1540   HCT 44.5 11/18/2023 1540   PLT 301 11/18/2023 1540   MCV 91.6 11/18/2023 1540   MCH 29.0 11/18/2023 1540   MCHC 31.7 (L) 11/18/2023  1540   RDW 12.5 11/18/2023 1540   LYMPHSABS 1,683 07/16/2022 1500   MONOABS 0.5 03/12/2017 1007   EOSABS 198 11/18/2023 1540   BASOSABS 40 11/18/2023 1540    CMP     Component Value Date/Time   NA 138 12/23/2023 1541   K 4.1 12/23/2023 1541   CL 103 12/23/2023 1541   CO2 27 12/23/2023 1541   GLUCOSE 100 (H) 12/23/2023 1541   BUN 20 12/23/2023 1541   CREATININE 0.83 12/23/2023 1541   CALCIUM  9.9 12/23/2023 1541   PROT 6.5 11/18/2023 1540   ALBUMIN 3.4 (L) 11/26/2021 1050   AST 17 11/18/2023 1540   ALT 13 11/18/2023 1540   ALKPHOS 56 11/26/2021 1050   BILITOT 0.4 11/18/2023 1540   GFRNONAA >60 11/26/2021 1050   GFRNONAA 56 (L) 06/24/2020 1023   GFRAA 65 06/24/2020 1023   Echocardiogram 11/27/2021 1. Left ventricular ejection fraction, by estimation, is 60 to 65%. The  left ventricle has normal function. The left ventricle has no regional  wall motion abnormalities. Left ventricular diastolic parameters are  consistent with Grade I diastolic  dysfunction (impaired relaxation).   2. Right ventricular systolic function is normal. The right ventricular  size is normal. There is normal pulmonary artery systolic pressure. The  estimated right ventricular systolic pressure is 20.6 mmHg.   3. The mitral valve is normal in structure. Trivial mitral valve  regurgitation. No evidence of mitral stenosis.   4. The aortic valve is tricuspid. There is moderate calcification of the  aortic valve. Aortic valve regurgitation is trivial. Aortic valve  sclerosis/calcification is present, without any evidence of aortic  stenosis.   5. The inferior vena cava is normal in size with greater than 50%  respiratory variability, suggesting right atrial pressure of 3 mmHg.   Assessment/Plan:   Chest wall pain LUQ abdominal pain GERD Patient seen today for evaluation of upper abdominal pain.  Symptom onset about 5 days ago and has been gradually improving since then.  Pain is localized to LUQ and  she reports tenderness to palpation in this area.  No radiation.  Having some increased abdominal gas but otherwise no associated symptoms.  Pain is aggravated by position change and movement.  Has not tried anything over-the-counter for this.  Has not talked with PCP regarding symptoms.  Concerned it might  be due to her previous GI issues (has history of GERD and was previously on omeprazole ) or that she may have broken a rib.  On exam she actually has very minimal pain on palpation of LUQ, but pain is reproducible on palpation of left lower ribs anteriorly and laterally.  There are no overlying skin changes, no swelling or ecchymosis.  She has worsening pain with position change during exam such as moving from sitting to lying down on exam table and vice versa. Denies nausea, vomiting, fever, chills, shortness of breath, weakness, dizziness, rectal bleeding, melena.  Vitals normal. Suspect costochondritis/musculoskeletal source of pain.   - Labs today: CBC, CMP, lipase - Start Omeprazole  40 mg daily - Order CXR and KUB - Can try Tylenol  as needed for pain - Try application of heat - Advised close follow up with PCP - ED precautions given for worsening symptoms   Camie Furbish, PA-C [facility] 02/18/2024, 2:15 PM  Patient Care Team: [name], NP as PCP - General (Adult Health Nurse Practitioner) [name], MD as PCP - Cardiology (Cardiology) [name], MD as Consulting Physician (Ophthalmology)

## 2024-02-25 ENCOUNTER — Ambulatory Visit (HOSPITAL_BASED_OUTPATIENT_CLINIC_OR_DEPARTMENT_OTHER): Admitting: Cardiology

## 2024-02-25 ENCOUNTER — Other Ambulatory Visit: Payer: Self-pay | Admitting: Orthopedic Surgery

## 2024-02-25 DIAGNOSIS — I10 Essential (primary) hypertension: Secondary | ICD-10-CM

## 2024-03-01 NOTE — Telephone Encounter (Signed)
 Pt called into clinic. She had questions regarding lab results specifically kidney function. Discussed results with pt and recommendations to increase hydration. Pt will see her PCP tomorrow and she will further discuss this with her. Pt reports that after starting Omeprazole , she is feeling better and is having less abdominal pain.

## 2024-03-02 ENCOUNTER — Ambulatory Visit: Admitting: Orthopedic Surgery

## 2024-03-02 VITALS — BP 138/80 | HR 80 | Temp 97.9°F | Ht 62.0 in | Wt 155.0 lb

## 2024-03-02 DIAGNOSIS — K219 Gastro-esophageal reflux disease without esophagitis: Secondary | ICD-10-CM | POA: Diagnosis not present

## 2024-03-02 DIAGNOSIS — Z23 Encounter for immunization: Secondary | ICD-10-CM

## 2024-03-02 DIAGNOSIS — N1831 Chronic kidney disease, stage 3a: Secondary | ICD-10-CM | POA: Diagnosis not present

## 2024-03-02 DIAGNOSIS — E0865 Diabetes mellitus due to underlying condition with hyperglycemia: Secondary | ICD-10-CM | POA: Diagnosis not present

## 2024-03-02 DIAGNOSIS — F419 Anxiety disorder, unspecified: Secondary | ICD-10-CM

## 2024-03-02 NOTE — Patient Instructions (Addendum)
 Good kidney health> drink at least 40 -60 ounces daily   Avoid NSAIDS> they are bad for kidneys> do not take ibuprofen, Advil, Aleve, naproxen

## 2024-03-02 NOTE — Progress Notes (Unsigned)
 Careteam: Patient Care Team: [name], NP as PCP - General (Adult Health Nurse Practitioner) [name], MD as PCP - Cardiology (Cardiology) [name], MD as Consulting Physician (Ophthalmology)  Seen by: [name], AGNP-C  PLACE OF SERVICE:  [facility]  Advanced Directive information    Allergies  Allergen Reactions   Statins Other (See Comments)    Myalgias with crestor  even when taken only a few times per week   Dairycare [Bacid]     Stomach upset    Erythromycin Itching    SEVERE ITCHING   Other Other (See Comments)    NO ACIDIC FOODS!! They cause GI upset   Pollen Extract-Tree Extract [Pollen Extract]    Tomato Other (See Comments)    Causes GI upset   Zyrtec  [Cetirizine  Hcl]     Increased sleepiness    Chief Complaint  Patient presents with   kidney issue     HPI: Patient is a 89 y.o. female seen today for acute visit due to elevated GFR.   Discussed the use of AI scribe software for clinical note transcription with the patient, who gave verbal consent to proceed.  History of Present Illness    Recent labs by GI showed a GFR of 55. Patient was advised to discuss with PCP. She is concerned about this decline and is focused on maintaining her kidney health. She consumes a lot of water and avoids over-the-counter medications like ibuprofen and Aleve, which can affect kidney function.  Her blood sugar levels were slightly elevated recently, which she attributes to consuming chocolate. Her blood sugar was recorded at 100. She acknowledges that her glucose levels fluctuate and has been tested for diabetes in the past.  She has a history of anxiety and was previously prescribed Lexapro (Celexapram) for anxiety, but discontinued it due to increased tinnitus. She did not start a second medication due to concerns about potential side effects.  She experiences pain related to acid reflux and was prescribed medication for it, which she has taken  before. She is aware of the warnings about long-term use of this medication but is concerned about the potential consequences of not taking it, such as developing a stomach ulcer. No new symptoms related to acid reflux are reported.  She experiences fatigue, stays up late, and sleeps late, but does not report any other specific symptoms related to this.       Review of Systems:  Review of Systems  Constitutional: Negative.   HENT:  Positive for tinnitus.   Respiratory: Negative.    Cardiovascular: Negative.   Gastrointestinal:  Positive for heartburn.  Genitourinary: Negative.   Musculoskeletal: Negative.   Neurological: Negative.   Psychiatric/Behavioral:  Negative for depression. The patient is nervous/anxious.     Past Medical History:  Diagnosis Date   Allergic rhinitis    Atypical chest pain 09/10/2015   Bronchitis, acute    Dyspnea on exertion    Exudative pleural effusion 5/09    sterile, benign, inflammatory   GERD (gastroesophageal reflux disease) 09/10/2015   Systolic murmur 09/10/2015   Past Surgical History:  Procedure Laterality Date   APPENDECTOMY     CATARACT EXTRACTION W/ INTRAOCULAR LENS IMPLANT Bilateral 12/2018   OOPHORECTOMY Right    thoracentesis left  5/09   Social History:   reports that she has never smoked. She has never used smokeless tobacco. She reports that she does not drink alcohol  and does not use drugs.  Family History  Problem Relation Age  of Onset   Allergies Mother    Heart disease Mother 75   Cancer Mother    Dementia Mother    Heart disease Father 54   Thyroid cancer Sister    Dementia Sister 80    Medications: Patient's Medications  New Prescriptions   No medications on file  Previous Medications   ACCU-CHEK SOFTCLIX LANCETS LANCETS    USE UP TO 4 TIMES DAILY AS DIRECTED   AMLODIPINE  (NORVASC ) 2.5 MG TABLET    TAKE 1 TABLET BY MOUTH EVERY DAY   BLOOD GLUCOSE METER KIT AND SUPPLIES    Dispense based on patient and insurance  preference. Use up to four times daily as directed. (FOR ICD-10 E10.9, E11.9).   CITALOPRAM  (CELEXA ) 10 MG TABLET    TAKE 1 TABLET BY MOUTH EVERY DAY   COENZYME Q10 100 MG TABS    Take by mouth.   EZETIMIBE  (ZETIA ) 10 MG TABLET    TAKE 1 TABLET BY MOUTH EVERY OTHER DAY   FLUTICASONE  (FLONASE ) 50 MCG/ACT NASAL SPRAY    Place 1 spray into both nostrils as needed for allergies or rhinitis.   METFORMIN  (GLUCOPHAGE ) 500 MG TABLET    TAKE 1 TABLET BY MOUTH EVERY DAY WITH BREAKFAST   MULTIPLE VITAMINS-MINERALS (MULTIVITAMIN WOMEN 50+) TABS    Take 1 tablet by mouth daily.   OMEPRAZOLE  (PRILOSEC) 40 MG CAPSULE    Take 1 capsule (40 mg total) by mouth daily.   PROBIOTIC PRODUCT (UP4 PROBIOTICS PO)    Take 1 tablet by mouth daily.   PROPYLENE GLYCOL (SYSTANE BALANCE OP)    Apply 1 drop to eye daily as needed (dry eyes).   ROSUVASTATIN  (CRESTOR ) 5 MG TABLET    TAKE 2 TABLETS (10 MG TOTAL) BY MOUTH EVERY OTHER DAY.  Modified Medications   No medications on file  Discontinued Medications   No medications on file    Physical Exam:  Vitals:   03/02/24 1453  BP: 138/80  Pulse: 80  Temp: 97.9 F (36.6 C)  SpO2: 94%  Weight: 155 lb (70.3 kg)  Height: 5' 2 (1.575 m)   Body mass index is 28.35 kg/m. Wt Readings from Last 3 Encounters:  03/02/24 155 lb (70.3 kg)  02/18/24 151 lb (68.5 kg)  12/23/23 151 lb 12.8 oz (68.9 kg)    Physical Exam Vitals reviewed.  Constitutional:      General: She is not in acute distress. HENT:     Head: Normocephalic.  Eyes:     General:        Right eye: No discharge.        Left eye: No discharge.  Cardiovascular:     Rate and Rhythm: Normal rate and regular rhythm.     Pulses: Normal pulses.     Heart sounds: Normal heart sounds.  Pulmonary:     Effort: Pulmonary effort is normal.     Breath sounds: Normal breath sounds.  Abdominal:     General: Bowel sounds are normal.     Palpations: Abdomen is soft.  Musculoskeletal:     Cervical back: Neck  supple.     Right lower leg: No edema.     Left lower leg: No edema.  Skin:    General: Skin is warm.     Capillary Refill: Capillary refill takes less than 2 seconds.  Neurological:     General: No focal deficit present.     Mental Status: She is alert and oriented to person, place, and time.  Psychiatric:        Mood and Affect: Mood normal.     Labs reviewed: Basic Metabolic Panel: Recent Labs    11/18/23 1540 12/23/23 1541 02/18/24 1512  NA 141 138 139  K 4.0 4.1 4.0  CL 104 103 102  CO2 30 27 29   GLUCOSE 168* 100* 128*  BUN 12 20 18   CREATININE 0.86 0.83 0.92  CALCIUM  9.5 9.9 9.4  TSH 3.19  --   --    Liver Function Tests: Recent Labs    11/18/23 1540 02/18/24 1512  AST 17 14  ALT 13 12  ALKPHOS  --  83  BILITOT 0.4 0.5  PROT 6.5 7.4  ALBUMIN  --  4.6   Recent Labs    02/18/24 1512  LIPASE 13.0   No results for input(s): AMMONIA in the last 8760 hours. CBC: Recent Labs    11/18/23 1540 02/18/24 1512  WBC 6.6 7.6  NEUTROABS 4,356 5.2  HGB 14.1 15.0  HCT 44.5 44.5  MCV 91.6 86.5  PLT 301 277.0   Lipid Panel: Recent Labs    05/25/23 1105  CHOL 160  HDL 55  LDLCALC 81  TRIG 139  CHOLHDL 2.9   TSH: Recent Labs    11/18/23 1540  TSH 3.19   A1C: Lab Results  Component Value Date   HGBA1C 6.7 (H) 11/18/2023     Assessment/Plan 1. Stage 3a chronic kidney disease (HCC) (Primary) - GFR 55 (10/17)> now 58 (10/30) - suspect mild decline associated with age - avoid NSAIDS - cont to hydrate with water  - Basic Metabolic Panel with eGFR  2. Diabetes mellitus due to underlying condition with hyperglycemia, without long-term current use of insulin (HCC) - recent A1c was 7.2> was 6.7 - admits to eating more chocolate - recommend diet low in sugar and carbs  - no recent hypoglycemias - cont metformin   - Hemoglobin A1c  3. Anxiety - intermittent - no recent panic - unsuccessful trial citalopram  due to increased tinnitus   4.  Gastroesophageal reflux disease without esophagitis - followed by GI - hgb normal - cont omeprazole   Total time: 32 minutes. Greater than 50% of total time spent doing patient education regarding health maintenance, CKD, anxiety, T2DM and GERD including symptom/medication management.    Next appt:Visit date not found  Charlyne Robertshaw Gil, [facility]  [facility] [phone]

## 2024-03-03 ENCOUNTER — Ambulatory Visit: Payer: Self-pay | Admitting: Orthopedic Surgery

## 2024-03-03 LAB — BASIC METABOLIC PANEL WITH GFR
BUN: 17 mg/dL (ref 7–25)
CO2: 25 mmol/L (ref 20–32)
Calcium: 9.2 mg/dL (ref 8.6–10.4)
Chloride: 105 mmol/L (ref 98–110)
Creat: 0.95 mg/dL (ref 0.60–0.95)
Glucose, Bld: 149 mg/dL — ABNORMAL HIGH (ref 65–99)
Potassium: 4.2 mmol/L (ref 3.5–5.3)
Sodium: 140 mmol/L (ref 135–146)
eGFR: 58 mL/min/1.73m2 — ABNORMAL LOW (ref >=60)

## 2024-03-03 LAB — HEMOGLOBIN A1C
Hgb A1c MFr Bld: 6.9 % — ABNORMAL HIGH (ref <5.7)
Mean Plasma Glucose: 151 mg/dL
eAG (mmol/L): 8.4 mmol/L

## 2024-03-07 ENCOUNTER — Other Ambulatory Visit: Payer: Self-pay | Admitting: Orthopedic Surgery

## 2024-03-07 DIAGNOSIS — E782 Mixed hyperlipidemia: Secondary | ICD-10-CM

## 2024-03-08 ENCOUNTER — Ambulatory Visit: Admitting: Physician Assistant

## 2024-03-31 ENCOUNTER — Other Ambulatory Visit: Payer: Self-pay | Admitting: Adult Health

## 2024-03-31 DIAGNOSIS — E782 Mixed hyperlipidemia: Secondary | ICD-10-CM

## 2024-04-03 NOTE — Telephone Encounter (Signed)
 High Risk Warning Populated when attempting to refill, I will send to Provider for further review

## 2024-05-10 ENCOUNTER — Other Ambulatory Visit: Payer: Self-pay | Admitting: Orthopedic Surgery

## 2024-05-10 DIAGNOSIS — E1140 Type 2 diabetes mellitus with diabetic neuropathy, unspecified: Secondary | ICD-10-CM

## 2024-08-31 ENCOUNTER — Ambulatory Visit: Admitting: Orthopedic Surgery
